# Patient Record
Sex: Female | Born: 1940
Health system: Southern US, Community
[De-identification: ages and names within clinical notes are randomized; demographics above are authoritative.]

## PROBLEM LIST (undated history)

## (undated) DIAGNOSIS — I10 Essential (primary) hypertension: Secondary | ICD-10-CM

## (undated) DIAGNOSIS — Z9221 Personal history of antineoplastic chemotherapy: Secondary | ICD-10-CM

## (undated) DIAGNOSIS — R12 Heartburn: Secondary | ICD-10-CM

## (undated) DIAGNOSIS — C259 Malignant neoplasm of pancreas, unspecified: Secondary | ICD-10-CM

## (undated) DIAGNOSIS — C50919 Malignant neoplasm of unspecified site of unspecified female breast: Secondary | ICD-10-CM

## (undated) DIAGNOSIS — F419 Anxiety disorder, unspecified: Secondary | ICD-10-CM

## (undated) DIAGNOSIS — M1990 Unspecified osteoarthritis, unspecified site: Secondary | ICD-10-CM

## (undated) HISTORY — DX: Malignant neoplasm of pancreas, unspecified: C25.9

## (undated) HISTORY — DX: Anxiety disorder, unspecified: F41.9

## (undated) HISTORY — DX: Heartburn: R12

## (undated) HISTORY — DX: Unspecified osteoarthritis, unspecified site: M19.90

## (undated) HISTORY — DX: Essential (primary) hypertension: I10

## (undated) HISTORY — PX: OTHER SURGICAL HISTORY: SHX169

## (undated) HISTORY — PX: ABDOMINAL HYSTERECTOMY: SHX81

## (undated) HISTORY — PX: GALLBLADDER SURGERY: SHX652

---

## 1976-03-23 HISTORY — PX: LAPAROSCOPIC OOPHERECTOMY: SHX6507

## 1987-03-24 HISTORY — PX: OTHER SURGICAL HISTORY: SHX169

## 1991-03-24 DIAGNOSIS — Z9221 Personal history of antineoplastic chemotherapy: Secondary | ICD-10-CM

## 1991-03-24 DIAGNOSIS — C50919 Malignant neoplasm of unspecified site of unspecified female breast: Secondary | ICD-10-CM

## 1991-03-24 HISTORY — DX: Malignant neoplasm of unspecified site of unspecified female breast: C50.919

## 1991-03-24 HISTORY — DX: Personal history of antineoplastic chemotherapy: Z92.21

## 1991-03-24 HISTORY — PX: MASTECTOMY: SHX3

## 2004-05-14 ENCOUNTER — Ambulatory Visit: Payer: Self-pay | Admitting: General Surgery

## 2004-12-26 ENCOUNTER — Ambulatory Visit: Payer: Self-pay | Admitting: Unknown Physician Specialty

## 2005-06-03 ENCOUNTER — Ambulatory Visit: Payer: Self-pay | Admitting: General Surgery

## 2006-05-07 ENCOUNTER — Other Ambulatory Visit: Payer: Self-pay

## 2006-05-07 ENCOUNTER — Observation Stay: Payer: Self-pay | Admitting: Internal Medicine

## 2006-06-22 ENCOUNTER — Ambulatory Visit: Payer: Self-pay | Admitting: General Surgery

## 2007-07-20 ENCOUNTER — Ambulatory Visit: Payer: Self-pay | Admitting: General Surgery

## 2007-12-07 ENCOUNTER — Ambulatory Visit: Payer: Self-pay | Admitting: Chiropractic Medicine

## 2008-02-02 ENCOUNTER — Ambulatory Visit: Payer: Self-pay | Admitting: Neurology

## 2008-07-25 ENCOUNTER — Ambulatory Visit: Payer: Self-pay | Admitting: Internal Medicine

## 2008-08-28 ENCOUNTER — Ambulatory Visit: Payer: Self-pay | Admitting: Unknown Physician Specialty

## 2009-09-12 ENCOUNTER — Ambulatory Visit: Payer: Self-pay | Admitting: Internal Medicine

## 2009-09-25 ENCOUNTER — Ambulatory Visit: Payer: Self-pay | Admitting: Internal Medicine

## 2010-09-16 ENCOUNTER — Ambulatory Visit: Payer: Self-pay | Admitting: Internal Medicine

## 2011-10-27 ENCOUNTER — Ambulatory Visit: Payer: Self-pay | Admitting: Internal Medicine

## 2012-11-02 ENCOUNTER — Ambulatory Visit: Payer: Self-pay | Admitting: Internal Medicine

## 2013-10-09 DIAGNOSIS — Z86010 Personal history of colonic polyps: Secondary | ICD-10-CM | POA: Insufficient documentation

## 2013-10-09 DIAGNOSIS — R1310 Dysphagia, unspecified: Secondary | ICD-10-CM | POA: Insufficient documentation

## 2013-10-09 DIAGNOSIS — K219 Gastro-esophageal reflux disease without esophagitis: Secondary | ICD-10-CM | POA: Insufficient documentation

## 2013-11-07 ENCOUNTER — Ambulatory Visit: Payer: Self-pay | Admitting: Internal Medicine

## 2013-11-13 ENCOUNTER — Ambulatory Visit: Payer: Self-pay | Admitting: Unknown Physician Specialty

## 2013-11-16 LAB — PATHOLOGY REPORT

## 2014-10-25 ENCOUNTER — Other Ambulatory Visit: Payer: Self-pay | Admitting: Internal Medicine

## 2014-10-25 DIAGNOSIS — Z1231 Encounter for screening mammogram for malignant neoplasm of breast: Secondary | ICD-10-CM

## 2014-11-09 ENCOUNTER — Ambulatory Visit
Admission: RE | Admit: 2014-11-09 | Discharge: 2014-11-09 | Disposition: A | Discharge status: Home / Self Care | Payer: PPO | Source: Ambulatory Visit | Attending: Internal Medicine | Admitting: Internal Medicine

## 2014-11-09 DIAGNOSIS — Z9011 Acquired absence of right breast and nipple: Secondary | ICD-10-CM | POA: Diagnosis not present

## 2014-11-09 DIAGNOSIS — Z1231 Encounter for screening mammogram for malignant neoplasm of breast: Secondary | ICD-10-CM | POA: Insufficient documentation

## 2014-11-09 HISTORY — DX: Personal history of antineoplastic chemotherapy: Z92.21

## 2014-11-09 HISTORY — DX: Malignant neoplasm of unspecified site of unspecified female breast: C50.919

## 2014-11-22 DIAGNOSIS — I10 Essential (primary) hypertension: Secondary | ICD-10-CM | POA: Insufficient documentation

## 2015-04-23 DIAGNOSIS — Z4431 Encounter for fitting and adjustment of external right breast prosthesis: Secondary | ICD-10-CM | POA: Diagnosis not present

## 2015-04-23 DIAGNOSIS — C50111 Malignant neoplasm of central portion of right female breast: Secondary | ICD-10-CM | POA: Diagnosis not present

## 2015-04-23 DIAGNOSIS — D0500 Lobular carcinoma in situ of unspecified breast: Secondary | ICD-10-CM | POA: Diagnosis not present

## 2015-05-24 DIAGNOSIS — I10 Essential (primary) hypertension: Secondary | ICD-10-CM | POA: Diagnosis not present

## 2015-05-24 DIAGNOSIS — E7800 Pure hypercholesterolemia, unspecified: Secondary | ICD-10-CM | POA: Diagnosis not present

## 2015-06-25 DIAGNOSIS — H16042 Marginal corneal ulcer, left eye: Secondary | ICD-10-CM | POA: Diagnosis not present

## 2015-07-01 DIAGNOSIS — H16042 Marginal corneal ulcer, left eye: Secondary | ICD-10-CM | POA: Diagnosis not present

## 2015-07-15 DIAGNOSIS — H16042 Marginal corneal ulcer, left eye: Secondary | ICD-10-CM | POA: Diagnosis not present

## 2015-09-16 DIAGNOSIS — N393 Stress incontinence (female) (male): Secondary | ICD-10-CM | POA: Diagnosis not present

## 2015-09-16 DIAGNOSIS — N3946 Mixed incontinence: Secondary | ICD-10-CM | POA: Diagnosis not present

## 2015-10-22 ENCOUNTER — Other Ambulatory Visit: Payer: Self-pay | Admitting: Internal Medicine

## 2015-10-22 DIAGNOSIS — Z1231 Encounter for screening mammogram for malignant neoplasm of breast: Secondary | ICD-10-CM

## 2015-11-06 DIAGNOSIS — R200 Anesthesia of skin: Secondary | ICD-10-CM | POA: Insufficient documentation

## 2015-11-06 DIAGNOSIS — M7061 Trochanteric bursitis, right hip: Secondary | ICD-10-CM | POA: Diagnosis not present

## 2015-11-06 DIAGNOSIS — R202 Paresthesia of skin: Secondary | ICD-10-CM | POA: Diagnosis not present

## 2015-11-06 DIAGNOSIS — M545 Low back pain, unspecified: Secondary | ICD-10-CM | POA: Insufficient documentation

## 2015-11-13 DIAGNOSIS — M5416 Radiculopathy, lumbar region: Secondary | ICD-10-CM | POA: Diagnosis not present

## 2015-11-13 DIAGNOSIS — M5136 Other intervertebral disc degeneration, lumbar region: Secondary | ICD-10-CM | POA: Diagnosis not present

## 2015-11-14 ENCOUNTER — Ambulatory Visit: Payer: PPO

## 2015-11-15 ENCOUNTER — Other Ambulatory Visit: Payer: Self-pay | Admitting: Internal Medicine

## 2015-11-15 ENCOUNTER — Other Ambulatory Visit: Payer: Self-pay | Admitting: Neurology

## 2015-11-15 ENCOUNTER — Ambulatory Visit
Admission: RE | Admit: 2015-11-15 | Discharge: 2015-11-15 | Disposition: A | Discharge status: Home / Self Care | Payer: PPO | Source: Ambulatory Visit | Attending: Internal Medicine | Admitting: Internal Medicine

## 2015-11-15 DIAGNOSIS — M545 Low back pain: Secondary | ICD-10-CM

## 2015-11-15 DIAGNOSIS — Z1231 Encounter for screening mammogram for malignant neoplasm of breast: Secondary | ICD-10-CM

## 2015-11-20 DIAGNOSIS — H8110 Benign paroxysmal vertigo, unspecified ear: Secondary | ICD-10-CM | POA: Diagnosis not present

## 2015-11-20 DIAGNOSIS — E7800 Pure hypercholesterolemia, unspecified: Secondary | ICD-10-CM | POA: Diagnosis not present

## 2015-11-20 DIAGNOSIS — I10 Essential (primary) hypertension: Secondary | ICD-10-CM | POA: Diagnosis not present

## 2015-11-20 DIAGNOSIS — M545 Low back pain: Secondary | ICD-10-CM | POA: Diagnosis not present

## 2015-11-21 ENCOUNTER — Ambulatory Visit
Admission: RE | Admit: 2015-11-21 | Discharge: 2015-11-21 | Disposition: A | Discharge status: Home / Self Care | Payer: PPO | Source: Ambulatory Visit | Attending: Neurology | Admitting: Neurology

## 2015-11-21 DIAGNOSIS — M5136 Other intervertebral disc degeneration, lumbar region: Secondary | ICD-10-CM | POA: Insufficient documentation

## 2015-11-21 DIAGNOSIS — M545 Low back pain: Secondary | ICD-10-CM

## 2015-11-26 DIAGNOSIS — Z0000 Encounter for general adult medical examination without abnormal findings: Secondary | ICD-10-CM | POA: Diagnosis not present

## 2015-11-26 DIAGNOSIS — I10 Essential (primary) hypertension: Secondary | ICD-10-CM | POA: Diagnosis not present

## 2015-11-26 DIAGNOSIS — M545 Low back pain: Secondary | ICD-10-CM | POA: Diagnosis not present

## 2015-11-26 DIAGNOSIS — E7800 Pure hypercholesterolemia, unspecified: Secondary | ICD-10-CM | POA: Diagnosis not present

## 2015-11-28 ENCOUNTER — Ambulatory Visit: Payer: PPO

## 2015-12-09 DIAGNOSIS — M5136 Other intervertebral disc degeneration, lumbar region: Secondary | ICD-10-CM | POA: Diagnosis not present

## 2015-12-09 DIAGNOSIS — M5416 Radiculopathy, lumbar region: Secondary | ICD-10-CM | POA: Diagnosis not present

## 2015-12-17 DIAGNOSIS — M545 Low back pain: Secondary | ICD-10-CM | POA: Diagnosis not present

## 2015-12-17 DIAGNOSIS — M7061 Trochanteric bursitis, right hip: Secondary | ICD-10-CM | POA: Diagnosis not present

## 2015-12-17 DIAGNOSIS — R200 Anesthesia of skin: Secondary | ICD-10-CM | POA: Diagnosis not present

## 2015-12-17 DIAGNOSIS — R202 Paresthesia of skin: Secondary | ICD-10-CM | POA: Diagnosis not present

## 2015-12-17 DIAGNOSIS — G479 Sleep disorder, unspecified: Secondary | ICD-10-CM | POA: Insufficient documentation

## 2015-12-17 DIAGNOSIS — H8110 Benign paroxysmal vertigo, unspecified ear: Secondary | ICD-10-CM | POA: Diagnosis not present

## 2015-12-19 DIAGNOSIS — M5416 Radiculopathy, lumbar region: Secondary | ICD-10-CM | POA: Diagnosis not present

## 2015-12-19 DIAGNOSIS — M5136 Other intervertebral disc degeneration, lumbar region: Secondary | ICD-10-CM | POA: Diagnosis not present

## 2016-01-09 DIAGNOSIS — M5136 Other intervertebral disc degeneration, lumbar region: Secondary | ICD-10-CM | POA: Diagnosis not present

## 2016-01-09 DIAGNOSIS — M5416 Radiculopathy, lumbar region: Secondary | ICD-10-CM | POA: Diagnosis not present

## 2016-02-26 DIAGNOSIS — M5416 Radiculopathy, lumbar region: Secondary | ICD-10-CM | POA: Diagnosis not present

## 2016-02-26 DIAGNOSIS — M5136 Other intervertebral disc degeneration, lumbar region: Secondary | ICD-10-CM | POA: Diagnosis not present

## 2016-03-10 DIAGNOSIS — R300 Dysuria: Secondary | ICD-10-CM | POA: Diagnosis not present

## 2016-03-10 DIAGNOSIS — R1031 Right lower quadrant pain: Secondary | ICD-10-CM | POA: Diagnosis not present

## 2016-03-10 DIAGNOSIS — R1032 Left lower quadrant pain: Secondary | ICD-10-CM | POA: Diagnosis not present

## 2016-03-10 DIAGNOSIS — M5441 Lumbago with sciatica, right side: Secondary | ICD-10-CM | POA: Diagnosis not present

## 2016-04-30 DIAGNOSIS — M5416 Radiculopathy, lumbar region: Secondary | ICD-10-CM | POA: Diagnosis not present

## 2016-04-30 DIAGNOSIS — M5136 Other intervertebral disc degeneration, lumbar region: Secondary | ICD-10-CM | POA: Diagnosis not present

## 2016-05-26 DIAGNOSIS — Z0000 Encounter for general adult medical examination without abnormal findings: Secondary | ICD-10-CM | POA: Diagnosis not present

## 2016-05-26 DIAGNOSIS — I10 Essential (primary) hypertension: Secondary | ICD-10-CM | POA: Diagnosis not present

## 2016-05-27 ENCOUNTER — Emergency Department
Admission: EM | Admit: 2016-05-27 | Discharge: 2016-05-27 | Disposition: A | Discharge status: Home / Self Care | Payer: PPO | Attending: Emergency Medicine | Admitting: Emergency Medicine

## 2016-05-27 ENCOUNTER — Encounter: Payer: Self-pay | Admitting: Emergency Medicine

## 2016-05-27 DIAGNOSIS — M549 Dorsalgia, unspecified: Secondary | ICD-10-CM | POA: Diagnosis not present

## 2016-05-27 DIAGNOSIS — Z79899 Other long term (current) drug therapy: Secondary | ICD-10-CM | POA: Diagnosis not present

## 2016-05-27 DIAGNOSIS — F419 Anxiety disorder, unspecified: Secondary | ICD-10-CM | POA: Insufficient documentation

## 2016-05-27 DIAGNOSIS — I10 Essential (primary) hypertension: Secondary | ICD-10-CM | POA: Insufficient documentation

## 2016-05-27 DIAGNOSIS — Z853 Personal history of malignant neoplasm of breast: Secondary | ICD-10-CM | POA: Insufficient documentation

## 2016-05-27 DIAGNOSIS — Z7982 Long term (current) use of aspirin: Secondary | ICD-10-CM | POA: Diagnosis not present

## 2016-05-27 LAB — URINALYSIS, COMPLETE (UACMP) WITH MICROSCOPIC
BACTERIA UA: NONE SEEN
Bilirubin Urine: NEGATIVE
GLUCOSE, UA: NEGATIVE mg/dL
KETONES UR: NEGATIVE mg/dL
Leukocytes, UA: NEGATIVE
Nitrite: NEGATIVE
PH: 6 (ref 5.0–8.0)
Protein, ur: NEGATIVE mg/dL
Specific Gravity, Urine: 1.003 — ABNORMAL LOW (ref 1.005–1.030)

## 2016-05-27 LAB — CBC WITH DIFFERENTIAL/PLATELET
BASOS ABS: 0 10*3/uL (ref 0–0.1)
Basophils Relative: 1 %
Eosinophils Absolute: 0.2 10*3/uL (ref 0–0.7)
Eosinophils Relative: 3 %
HEMATOCRIT: 42.3 % (ref 35.0–47.0)
HEMOGLOBIN: 14.8 g/dL (ref 12.0–16.0)
LYMPHS PCT: 18 %
Lymphs Abs: 1.2 10*3/uL (ref 1.0–3.6)
MCH: 33.1 pg (ref 26.0–34.0)
MCHC: 35 g/dL (ref 32.0–36.0)
MCV: 94.6 fL (ref 80.0–100.0)
MONO ABS: 0.6 10*3/uL (ref 0.2–0.9)
MONOS PCT: 9 %
NEUTROS PCT: 69 %
Neutro Abs: 4.8 10*3/uL (ref 1.4–6.5)
Platelets: 310 10*3/uL (ref 150–440)
RBC: 4.47 MIL/uL (ref 3.80–5.20)
RDW: 13 % (ref 11.5–14.5)
WBC: 6.9 10*3/uL (ref 3.6–11.0)

## 2016-05-27 LAB — COMPREHENSIVE METABOLIC PANEL
ALBUMIN: 4.2 g/dL (ref 3.5–5.0)
ALK PHOS: 58 U/L (ref 38–126)
ALT: 18 U/L (ref 14–54)
AST: 28 U/L (ref 15–41)
Anion gap: 8 (ref 5–15)
BILIRUBIN TOTAL: 0.5 mg/dL (ref 0.3–1.2)
BUN: 14 mg/dL (ref 6–20)
CALCIUM: 9.3 mg/dL (ref 8.9–10.3)
CO2: 27 mmol/L (ref 22–32)
Chloride: 105 mmol/L (ref 101–111)
Creatinine, Ser: 0.65 mg/dL (ref 0.44–1.00)
GFR calc Af Amer: >60 mL/min (ref >60)
GLUCOSE: 113 mg/dL — AB (ref 65–99)
Potassium: 3.9 mmol/L (ref 3.5–5.1)
Sodium: 140 mmol/L (ref 135–145)
TOTAL PROTEIN: 7 g/dL (ref 6.5–8.1)

## 2016-05-27 LAB — TROPONIN I

## 2016-05-27 MED ORDER — DIAZEPAM 5 MG PO TABS
5.0000 mg | ORAL_TABLET | Freq: Once | ORAL | Status: AC
  Administered 2016-05-27: 5 mg via ORAL
  Filled 2016-05-27: qty 1

## 2016-05-27 NOTE — ED Triage Notes (Signed)
Pt via ems from home with chronic back pain and bp that is trending upward. She called her pcp, who told her to come to the ED. Pt alert & oriented with nad noted.

## 2016-05-27 NOTE — ED Notes (Signed)
Patient ambulated to the bathroom with out difficulty. Patients son was in the room upon my arrival. Patient got very tearful when patient left for privacy with urination stating some personal issues with him. Patient asked for son to stay out in lobby until he decided to go home.

## 2016-05-27 NOTE — ED Provider Notes (Signed)
[facility] Emergency Department Provider Note        Time seen: ----------------------------------------- 5:26 PM on 05/27/2016 -----------------------------------------    I have reviewed the triage vital signs and the nursing notes.   HISTORY  Chief Complaint Hypertension and Back Pain    HPI Emily Harding is a 76 y.o. female who presents to ER with chronic back pain and blood pressure that is trending upward. Patient called her primary care doctor who told her to come to the ER due to high blood pressure. On arrival blood pressure was 202/100. She denies any complaints other than her chronic back pain. She denies recent illness, fevers, chills, chest pain, shortness of breath, vomiting or diarrhea. She is requesting something for her anxiety as well.   Past Medical History:  Diagnosis Date  . Breast cancer ([place]) 1993   RT MASTECTOMY  . S/P chemotherapy, time since greater than 12 weeks 1993   BREAST CA    There are no active problems to display for this patient.   Past Surgical History:  Procedure Laterality Date  . ABDOMINAL HYSTERECTOMY    . MASTECTOMY Right 1993   BREAST CA    Allergies Patient has no allergy information on record.  Social History Social History  Substance Use Topics  . Smoking status: Not on file  . Smokeless tobacco: Not on file  . Alcohol use Not on file    Review of Systems Constitutional: Negative for fever. Cardiovascular: Negative for chest pain. Respiratory: Negative for shortness of breath. Gastrointestinal: Negative for abdominal pain, vomiting and diarrhea. Genitourinary: Negative for dysuria. Musculoskeletal: Positive for back pain Skin: Negative for rash. Neurological: Negative for headaches, focal weakness or numbness. Psychiatric: Positive for anxiety  10-point ROS otherwise negative.  ____________________________________________   PHYSICAL EXAM:  VITAL SIGNS: ED Triage Vitals  [05/27/16 1725]  Enc Vitals Group     BP (!) 202/100     Pulse Rate 75     Resp 18     Temp 98.7 F (37.1 C)     Temp Source Oral     SpO2 97 %     Weight      Height      Head Circumference      Peak Flow      Pain Score      Pain Loc      Pain Edu?      Excl. in [place]?    Constitutional: Alert and oriented. Anxious, no distress Eyes: Conjunctivae are normal. PERRL. Normal extraocular movements. ENT   Head: Normocephalic and atraumatic.   Nose: No congestion/rhinnorhea.   Mouth/Throat: Mucous membranes are moist.   Neck: No stridor. Cardiovascular: Normal rate, regular rhythm. No murmurs, rubs, or gallops. Respiratory: Normal respiratory effort without tachypnea nor retractions. Breath sounds are clear and equal bilaterally. No wheezes/rales/rhonchi. Gastrointestinal: Soft and nontender. Normal bowel sounds Musculoskeletal: Nontender with normal range of motion in all extremities. No lower extremity tenderness nor edema. Neurologic:  Normal speech and language. No gross focal neurologic deficits are appreciated.  Skin:  Skin is warm, dry and intact. No rash noted. Psychiatric: Mood and affect are normal. Speech and behavior are normal.  ____________________________________________  EKG: Interpreted by me. Sinus rhythm rate of 75 bpm, normal PR interval, wide QRS, left bundle branch block.  ____________________________________________  ED COURSE:  Pertinent labs & imaging results that were available during my care of the patient were reviewed by me and considered in my medical decision making (see  chart for details). Patient presents to ER with back pain and anxiety. We will assess with labs and given oral Valium.   Procedures ____________________________________________   LABS (pertinent positives/negatives)  Labs Reviewed  URINALYSIS, COMPLETE (UACMP) WITH MICROSCOPIC - Abnormal; Notable for the following:       Result Value   Color, Urine COLORLESS (*)     APPearance CLEAR (*)    Specific Gravity, Urine 1.003 (*)    Hgb urine dipstick SMALL (*)    Squamous Epithelial / LPF 0-5 (*)    All other components within normal limits  COMPREHENSIVE METABOLIC PANEL - Abnormal; Notable for the following:    Glucose, Bld 113 (*)    All other components within normal limits  CBC WITH DIFFERENTIAL/PLATELET  TROPONIN I  CBC WITH DIFFERENTIAL/PLATELET   ____________________________________________  FINAL ASSESSMENT AND PLAN  Hypertension  Plan: Patient with labs and imaging as dictated above. Patient essentially presented due to high blood pressure. She has chronic back pain and admittedly was somewhat anxious. She does have an old left bundle branch block as seen on previous EKG. She was not having complaints of chest pain or shortness of breath. She is stable for outpatient follow-up with her doctor.   Earleen Newport, MD   Note: This note was generated in part or whole with voice recognition software. Voice recognition is usually quite accurate but there are transcription errors that can and very often do occur. I apologize for any typographical errors that were not detected and corrected.     Earleen Newport, MD 05/27/16 2017

## 2016-05-27 NOTE — ED Notes (Signed)
Lab personnel in room 6; will try room 7 next.

## 2016-06-02 DIAGNOSIS — I10 Essential (primary) hypertension: Secondary | ICD-10-CM | POA: Diagnosis not present

## 2016-06-15 DIAGNOSIS — R32 Unspecified urinary incontinence: Secondary | ICD-10-CM | POA: Diagnosis not present

## 2016-06-15 DIAGNOSIS — I10 Essential (primary) hypertension: Secondary | ICD-10-CM | POA: Diagnosis not present

## 2016-06-15 DIAGNOSIS — R300 Dysuria: Secondary | ICD-10-CM | POA: Diagnosis not present

## 2016-06-30 DIAGNOSIS — M5136 Other intervertebral disc degeneration, lumbar region: Secondary | ICD-10-CM | POA: Diagnosis not present

## 2016-06-30 DIAGNOSIS — M5416 Radiculopathy, lumbar region: Secondary | ICD-10-CM | POA: Diagnosis not present

## 2016-07-06 DIAGNOSIS — I10 Essential (primary) hypertension: Secondary | ICD-10-CM | POA: Diagnosis not present

## 2016-07-14 ENCOUNTER — Ambulatory Visit: Payer: Self-pay

## 2016-07-16 DIAGNOSIS — R609 Edema, unspecified: Secondary | ICD-10-CM | POA: Diagnosis not present

## 2016-07-16 DIAGNOSIS — I10 Essential (primary) hypertension: Secondary | ICD-10-CM | POA: Diagnosis not present

## 2016-07-16 DIAGNOSIS — R42 Dizziness and giddiness: Secondary | ICD-10-CM | POA: Diagnosis not present

## 2016-07-27 DIAGNOSIS — R300 Dysuria: Secondary | ICD-10-CM | POA: Diagnosis not present

## 2016-07-27 DIAGNOSIS — I10 Essential (primary) hypertension: Secondary | ICD-10-CM | POA: Diagnosis not present

## 2016-08-03 NOTE — Progress Notes (Signed)
08/04/2016 2:45 PM   Emily Harding 1940/10/13 [id number]  Referring provider: Madelyn Brunner, MD [facility] [place], [place] [address]  Chief Complaint  Patient presents with  . New Patient (Initial Visit)    Dysuria changed urologists because of insurance    HPI: Patient is a 76 -year-old Caucasian female who is referred to [place] by, Dr. Lisette Grinder, for recurrent urinary tract infections.  Patient states that she has had three urinary tract infections over the last year.    Reviewing her records,  she has not had any documented infections over the last year.    Her symptoms with a urinary tract infection consist of suprapubic pain and dysuria.  She denies gross hematuria, back pain, abdominal pain or flank pain.   She has not had any recent fevers, chills, nausea or vomiting.   She does not have a history of nephrolithiasis, GU surgery or GU trauma.  She is not sexually active.  She is postmenopausal.   She admits to constipation.  She does engage in good perineal hygiene. She does not take tub baths.   Her baseline symptoms consist of frequency x 8-10, urgency is moderate, nocturia x 1, incontinence (SUI and UI), intermittency and hesitancy.  She is using incontinence pads for over ten years.  6 to 10 pads daily.    She is not having pain with bladder filling.  She has not had any recent imaging studies.    She is drinking three bottles of water daily.   Glass and a half of sweat tea and Sprite mostly for liquids.  2 cups of coffee a daily.    Her PVR is 0 mL.    PMH: Past Medical History:  Diagnosis Date  . Anxiety   . Arthritis   . Breast cancer ([place]) 1993   RT MASTECTOMY  . Heartburn   . HTN (hypertension)   . S/P chemotherapy, time since greater than 12 weeks 1993   BREAST CA    Surgical History: Past Surgical History:  Procedure Laterality Date  . ABDOMINAL HYSTERECTOMY    . colon polyp removal    . GALLBLADDER  SURGERY    . LAPAROSCOPIC OOPHERECTOMY  1978  . MASTECTOMY Right 1993   BREAST CA  . removal of fallopian tubes  1989    Home Medications:  Allergies as of 08/04/2016      Reactions   Azithromycin    Codeine Sulfate Other (See Comments)   Doxycycline Calcium Other (See Comments)   Duloxetine Nausea Only   Erythromycin Ethylsuccinate Other (See Comments)   Macrolides And Ketolides Other (See Comments)   Sulfa Antibiotics       Medication List       Accurate as of 08/04/16  2:45 PM. Always use your most recent med list.          aspirin EC 81 MG tablet Take 81 mg by mouth daily.   atenolol 50 MG tablet Commonly known as:  TENORMIN Take 50 mg by mouth daily.   celecoxib 200 MG capsule Commonly known as:  CELEBREX Take 200 mg by mouth daily.   citalopram 20 MG tablet Commonly known as:  CELEXA Take 20 mg by mouth at bedtime.   clorazepate 3.75 MG tablet Commonly known as:  TRANXENE Take 3.75 mg by mouth daily as needed.   cyclobenzaprine 10 MG tablet Commonly known as:  FLEXERIL TAKE 1 TABLET (10 MG TOTAL) BY MOUTH 3 (THREE)  TIMES DAILY AS NEEDED FOR MUSCLE SPASMS.   esomeprazole 40 MG capsule Commonly known as:  [place] Take by mouth.   gabapentin 400 MG capsule Commonly known as:  NEURONTIN Take 400 mg by mouth 3 (three) times daily.   HYDROcodone-acetaminophen 5-325 MG tablet Commonly known as:  NORCO/VICODIN Take 1 tablet by mouth every 6 (six) hours as needed.   losartan 50 MG tablet Commonly known as:  COZAAR Take by mouth.   meloxicam 7.5 MG tablet Commonly known as:  MOBIC Take 7.5 mg by mouth daily.   MULTI-VITAMINS Tabs Take 1 tablet by mouth daily.   promethazine 25 MG tablet Commonly known as:  PHENERGAN Take by mouth.   SM CALCIUM 500/VITAMIN D3 500-400 MG-UNIT tablet Generic drug:  calcium-vitamin D Take 1 tablet by mouth daily.   zolpidem 10 MG tablet Commonly known as:  AMBIEN Take 10 mg by mouth at bedtime.        Allergies:  Allergies  Allergen Reactions  . Azithromycin   . Codeine Sulfate Other (See Comments)  . Doxycycline Calcium Other (See Comments)  . Duloxetine Nausea Only  . Erythromycin Ethylsuccinate Other (See Comments)  . Macrolides And Ketolides Other (See Comments)  . Sulfa Antibiotics     Family History: Family History  Problem Relation Age of Onset  . Breast cancer Neg Hx   . Kidney cancer Neg Hx   . Bladder Cancer Neg Hx   . Prostate cancer Neg Hx     Social History:  reports that she has never smoked. She has never used smokeless tobacco. She reports that she does not drink alcohol or use drugs.  ROS: UROLOGY Frequent Urination?: Yes Hard to postpone urination?: Yes Burning/pain with urination?: No Get up at night to urinate?: Yes Leakage of urine?: Yes Urine stream starts and stops?: Yes Trouble starting stream?: Yes Do you have to strain to urinate?: No Blood in urine?: No Urinary tract infection?: Yes Sexually transmitted disease?: No Injury to kidneys or bladder?: No Painful intercourse?: No Weak stream?: No Currently pregnant?: No Vaginal bleeding?: No Last menstrual period?: n  Gastrointestinal Nausea?: Yes Vomiting?: No Indigestion/heartburn?: Yes Diarrhea?: No Constipation?: Yes  Constitutional Fever: No Night sweats?: No Weight loss?: No Fatigue?: Yes  Skin Skin rash/lesions?: No Itching?: No  Eyes Blurred vision?: No Double vision?: No  Ears/Nose/Throat Sore throat?: No Sinus problems?: No  Hematologic/Lymphatic Swollen glands?: No Easy bruising?: Yes  Cardiovascular Leg swelling?: No Chest pain?: No  Respiratory Cough?: No Shortness of breath?: No  Endocrine Excessive thirst?: Yes  Musculoskeletal Back pain?: Yes Joint pain?: Yes  Neurological Headaches?: No Dizziness?: Yes  Psychologic Depression?: No Anxiety?: No  Physical Exam: BP (!) 154/86   Pulse 69   Ht '5\' 3"'  (1.6 m)   Wt 157 lb 1.6 oz  (71.3 kg)   BMI 27.83 kg/m   Constitutional: Well nourished. Alert and oriented, No acute distress. HEENT: [place] AT, moist mucus membranes. Trachea midline, no masses. Cardiovascular: No clubbing, cyanosis, or edema. Respiratory: Normal respiratory effort, no increased work of breathing. GI: Abdomen is soft, non tender, non distended, no abdominal masses. Liver and spleen not palpable.  No hernias appreciated.  Stool sample for occult testing is not indicated.   GU: No CVA tenderness.  No bladder fullness or masses.  Atrophic external genitalia, normal pubic hair distribution, no lesions.  Normal urethral meatus, no lesions, no prolapse, no discharge.   No urethral masses, tenderness and/or tenderness. No bladder fullness, tenderness or masses. Normal  vagina mucosa, good estrogen effect, no discharge, no lesions, good pelvic support, Grade II cystocele is noted.  Demonstrated urine leakage on Valsalva. No rectocele noted.  Cervix, uterus and adnexa are surgically absent.  Anus and perineum are without rashes or lesions.   Skin: No rashes, bruises or suspicious lesions. Lymph: No cervical or inguinal adenopathy. Neurologic: Grossly intact, no focal deficits, moving all 4 extremities. Psychiatric: Normal mood and affect.  Laboratory Data: Lab Results  Component Value Date   WBC 6.9 05/27/2016   HGB 14.8 05/27/2016   HCT 42.3 05/27/2016   MCV 94.6 05/27/2016   PLT 310 05/27/2016    Lab Results  Component Value Date   CREATININE 0.65 05/27/2016     Lab Results  Component Value Date   AST 28 05/27/2016   Lab Results  Component Value Date   ALT 18 05/27/2016     Pertinent Imaging: Results for TALLULA, GRINDLE (MRN [id number]) as of 08/04/2016 14:21  Ref. Range 08/04/2016 14:05  Scan Result Unknown 0    Assessment & Plan:    1. Mixed Incontinence  - offered behavioral therapies, bladder training, bladder control strategies and pelvic floor muscle training- Patient  deferred  - fluid management - encouraged more water intake  - offered refer to gynecology for a pessary fitting - patient would like referral   2. Dysuria  - criteria for recurrent UTI has not been met with 2 or more infections in 6 months or 3 or greater infections in one year   - advised them to have CATH UA's for urinalysis and culture to prevent skin contamination of the specimen  - reviewed symptoms of UTI and advised not to have urine checked or be treated for UTI if not experiencing symptoms  - discussed antibiotic stewardship with the patient    3. Vaginal atrophy  - I explained to the patient that when women go through menopause and her estrogen levels are severely diminished, the normal vaginal flora will change.  This is due to an increase of the vaginal canal's pH. Because of this, the vaginal canal may be colonized by bacteria from the rectum instead of the protective lactobacillus.  This accompanied by the loss of the mucus barrier with vaginal atrophy is a cause of recurrent urinary tract infections.  - In some studies, the use of vaginal estrogen cream has been demonstrated to reduce  recurrent urinary tract infections to one a year.   - Patient was given a sample of vaginal estrogen cream (Premarin) and instructed to apply 0.63m (pea-sized amount)  just inside the vaginal introitus with a finger-tip on  Monday, Wednesday and Friday nights.  I explained to the patient that vaginally administered estrogen, which causes only a slight increase in the blood estrogen levels, have fewer contraindications and adverse systemic effects that oral HT.  - She will follow up in three months for an exam.      4. Cystocele  - see above                    Return in about 3 months (around 11/04/2016) for OAB questionnaire, PVR and exam.  These notes generated with voice recognition software. I apologize for typographical errors.  SZara Council [facility] [address] [place] [phone]

## 2016-08-04 ENCOUNTER — Ambulatory Visit (INDEPENDENT_AMBULATORY_CARE_PROVIDER_SITE_OTHER): Payer: PPO | Admitting: Urology

## 2016-08-04 ENCOUNTER — Encounter: Payer: Self-pay | Admitting: Urology

## 2016-08-04 VITALS — BP 154/86 | HR 69 | Ht 63.0 in | Wt 157.1 lb

## 2016-08-04 DIAGNOSIS — N3946 Mixed incontinence: Secondary | ICD-10-CM

## 2016-08-04 DIAGNOSIS — R300 Dysuria: Secondary | ICD-10-CM

## 2016-08-04 DIAGNOSIS — N952 Postmenopausal atrophic vaginitis: Secondary | ICD-10-CM

## 2016-08-04 DIAGNOSIS — N8111 Cystocele, midline: Secondary | ICD-10-CM | POA: Diagnosis not present

## 2016-08-04 LAB — BLADDER SCAN AMB NON-IMAGING: Scan Result: 0

## 2016-08-10 DIAGNOSIS — M5416 Radiculopathy, lumbar region: Secondary | ICD-10-CM | POA: Diagnosis not present

## 2016-08-10 DIAGNOSIS — M62830 Muscle spasm of back: Secondary | ICD-10-CM | POA: Diagnosis not present

## 2016-08-10 DIAGNOSIS — M5136 Other intervertebral disc degeneration, lumbar region: Secondary | ICD-10-CM | POA: Diagnosis not present

## 2016-08-13 ENCOUNTER — Telehealth: Payer: Self-pay | Admitting: Urology

## 2016-08-13 DIAGNOSIS — N3946 Mixed incontinence: Secondary | ICD-10-CM

## 2016-08-13 NOTE — Telephone Encounter (Signed)
Pt is totally confused since her visit with Larene Beach.  Please give pt a call to see if you can help answer some of her questions.   [phone]

## 2016-08-14 NOTE — Telephone Encounter (Signed)
Spoke with pt in reference to [place] with [facility]. Pt voiced concern of not having enough time spent with her to answer all her questions. Pt stated that she felt that she was rushed out of the office and did not get a chance to process anything that was said. Pt had questions about the vaginal cream, PT referral, and GYN referral. Reinforced with pt of vaginal cream purpose and usesage, PT and GYN referrals. Pt requested to see GYN at [facility]. Pt then stated that she was under the impression she would be getting a call from each referral within a weeks time and has not heard from anyone. Reinforced with pt it can take some time but both referrals were placed again. Reinforced with pt due to the holiday weekend it may take longer than normal for her to receive a call. Pt voiced understanding of whole conversation.

## 2016-08-29 DIAGNOSIS — N760 Acute vaginitis: Secondary | ICD-10-CM | POA: Diagnosis not present

## 2016-08-29 DIAGNOSIS — N390 Urinary tract infection, site not specified: Secondary | ICD-10-CM | POA: Diagnosis not present

## 2016-08-29 DIAGNOSIS — R300 Dysuria: Secondary | ICD-10-CM | POA: Diagnosis not present

## 2016-09-07 ENCOUNTER — Other Ambulatory Visit: Payer: Self-pay | Admitting: Physician Assistant

## 2016-09-07 DIAGNOSIS — R1030 Lower abdominal pain, unspecified: Secondary | ICD-10-CM | POA: Diagnosis not present

## 2016-09-07 DIAGNOSIS — N390 Urinary tract infection, site not specified: Secondary | ICD-10-CM | POA: Diagnosis not present

## 2016-09-07 DIAGNOSIS — I10 Essential (primary) hypertension: Secondary | ICD-10-CM | POA: Diagnosis not present

## 2016-09-07 DIAGNOSIS — R1031 Right lower quadrant pain: Secondary | ICD-10-CM

## 2016-09-08 ENCOUNTER — Ambulatory Visit
Admission: RE | Admit: 2016-09-08 | Discharge: 2016-09-08 | Disposition: A | Discharge status: Home / Self Care | Payer: PPO | Source: Ambulatory Visit | Attending: Physician Assistant | Admitting: Physician Assistant

## 2016-09-08 DIAGNOSIS — K449 Diaphragmatic hernia without obstruction or gangrene: Secondary | ICD-10-CM | POA: Diagnosis not present

## 2016-09-08 DIAGNOSIS — Z9049 Acquired absence of other specified parts of digestive tract: Secondary | ICD-10-CM | POA: Diagnosis not present

## 2016-09-08 DIAGNOSIS — R1031 Right lower quadrant pain: Secondary | ICD-10-CM | POA: Diagnosis not present

## 2016-09-08 DIAGNOSIS — R140 Abdominal distension (gaseous): Secondary | ICD-10-CM | POA: Diagnosis not present

## 2016-09-08 DIAGNOSIS — I700 Atherosclerosis of aorta: Secondary | ICD-10-CM | POA: Insufficient documentation

## 2016-09-08 DIAGNOSIS — Z90710 Acquired absence of both cervix and uterus: Secondary | ICD-10-CM | POA: Diagnosis not present

## 2016-09-08 MED ORDER — IOPAMIDOL (ISOVUE-300) INJECTION 61%
100.0000 mL | Freq: Once | INTRAVENOUS | Status: AC | PRN
  Administered 2016-09-08: 100 mL via INTRAVENOUS

## 2016-09-11 DIAGNOSIS — R300 Dysuria: Secondary | ICD-10-CM | POA: Diagnosis not present

## 2016-09-11 DIAGNOSIS — K5900 Constipation, unspecified: Secondary | ICD-10-CM | POA: Diagnosis not present

## 2016-09-15 DIAGNOSIS — M5416 Radiculopathy, lumbar region: Secondary | ICD-10-CM | POA: Diagnosis not present

## 2016-09-15 DIAGNOSIS — M5136 Other intervertebral disc degeneration, lumbar region: Secondary | ICD-10-CM | POA: Diagnosis not present

## 2016-09-16 ENCOUNTER — Telehealth: Payer: Self-pay

## 2016-09-16 ENCOUNTER — Ambulatory Visit (INDEPENDENT_AMBULATORY_CARE_PROVIDER_SITE_OTHER): Payer: PPO | Admitting: Urology

## 2016-09-16 ENCOUNTER — Encounter: Payer: Self-pay | Admitting: Urology

## 2016-09-16 VITALS — BP 157/76 | HR 86 | Ht 63.0 in | Wt 153.0 lb

## 2016-09-16 DIAGNOSIS — N390 Urinary tract infection, site not specified: Secondary | ICD-10-CM

## 2016-09-16 DIAGNOSIS — N3946 Mixed incontinence: Secondary | ICD-10-CM | POA: Diagnosis not present

## 2016-09-16 DIAGNOSIS — E785 Hyperlipidemia, unspecified: Secondary | ICD-10-CM | POA: Insufficient documentation

## 2016-09-16 DIAGNOSIS — N8111 Cystocele, midline: Secondary | ICD-10-CM | POA: Diagnosis not present

## 2016-09-16 DIAGNOSIS — M797 Fibromyalgia: Secondary | ICD-10-CM | POA: Insufficient documentation

## 2016-09-16 DIAGNOSIS — K635 Polyp of colon: Secondary | ICD-10-CM | POA: Insufficient documentation

## 2016-09-16 DIAGNOSIS — C50919 Malignant neoplasm of unspecified site of unspecified female breast: Secondary | ICD-10-CM | POA: Insufficient documentation

## 2016-09-16 NOTE — Telephone Encounter (Signed)
Patient states she is concerned about taking Myrbetriq samples given today b/c she has 4 of the 7 side effects currently. She is requesting to try different med if possible b/c she insists she cannot have surgery until she tries medication.

## 2016-09-16 NOTE — Patient Instructions (Signed)
Probiotics or daily yogurt (live active culture)  Cranberry twice daily  If you have UTI symptoms, I want you to come here for infections.    Do not use estrogen cream due to your history of breast cancer.  Trial of Mybetriq x 1 month.  Follow up with Dr. Matilde Sprang

## 2016-09-16 NOTE — Progress Notes (Signed)
09/16/2016 4:13 PM   Emily Harding 1941-03-07 [id number]  Referring provider: Madelyn Brunner, MD [facility] [place], [place] [address]  No chief complaint on file.  HPI: 76 year old female with recurrent urinary tract infections as well as mixed urinary incontinence who presents today for a second opinion. She reports that she did not have a good interaction with the PA in our office and is requesting to be seen by physician.  She reports that she's had numerous infections over the past year. Review of records reveals several suspicious urinalyses since March 2018.  She does have one documented Escherichia coli infection on 08/29/2016. She has multiple drug allergies. Her symptoms include suprapubic pain and dysuria. She is a personal history of breast cancer therefore is unable to tolerate topical estrogen cream.  In addition to this, she is very bothered by her urinary symptoms. These include urinary frequency, and urgency as well as with pelvic urge incontinence(large volume incontinence). She saturates numerous pads per day. She also has some mild occasional leakage with laughing and sneezing. She was prescribed Mybetriq 25 mg tablets but has not taken these medications that she is worried about the side effects.  She is also fixated today on having surgery for her "bladder problems".  She reports that she was offered surgery by Dr. Rogers Blocker last year, but had to change providers due to insurance issues. She would like to have her surgery on a very specific timeline due to her family situation.  Behavioral modification was previous to discussed but she can be sutured drink sweet tea, spray, and 2 cups of coffee daily.  She does have chronic constipation.  Please see Zara Council note for further history.     PMH: Past Medical History:  Diagnosis Date  . Anxiety   . Arthritis   . Breast cancer ([place]) 1993   RT MASTECTOMY  . Heartburn   .  HTN (hypertension)   . S/P chemotherapy, time since greater than 12 weeks 1993   BREAST CA    Surgical History: Past Surgical History:  Procedure Laterality Date  . ABDOMINAL HYSTERECTOMY    . colon polyp removal    . GALLBLADDER SURGERY    . LAPAROSCOPIC OOPHERECTOMY  1978  . MASTECTOMY Right 1993   BREAST CA  . removal of fallopian tubes  1989    Home Medications:  Allergies as of 09/16/2016      Reactions   Azithromycin    Codeine Sulfate Other (See Comments)   Doxycycline Calcium Other (See Comments)   Duloxetine Nausea Only   Erythromycin Ethylsuccinate Other (See Comments)   Macrolides And Ketolides Other (See Comments)   Nitrofurantoin    Other reaction(s): Unknown   Sulfa Antibiotics       Medication List       Accurate as of 09/16/16 11:59 PM. Always use your most recent med list.          aspirin EC 81 MG tablet Take 81 mg by mouth daily.   atenolol 50 MG tablet Commonly known as:  TENORMIN Take 50 mg by mouth daily.   celecoxib 200 MG capsule Commonly known as:  CELEBREX Take 200 mg by mouth daily.   clorazepate 3.75 MG tablet Commonly known as:  TRANXENE Take 3.75 mg by mouth daily as needed.   cyclobenzaprine 10 MG tablet Commonly known as:  FLEXERIL TAKE 1 TABLET (10 MG TOTAL) BY MOUTH 3 (THREE) TIMES DAILY AS NEEDED FOR MUSCLE SPASMS.  esomeprazole 40 MG capsule Commonly known as:  NEXIUM Take by mouth.   gabapentin 400 MG capsule Commonly known as:  NEURONTIN Take 400 mg by mouth 3 (three) times daily.   HYDROcodone-acetaminophen 5-325 MG tablet Commonly known as:  NORCO/VICODIN Take 1 tablet by mouth every 6 (six) hours as needed.   losartan 50 MG tablet Commonly known as:  COZAAR Take by mouth.   meloxicam 7.5 MG tablet Commonly known as:  MOBIC Take 7.5 mg by mouth daily.   MULTI-VITAMINS Tabs Take 1 tablet by mouth daily.   promethazine 25 MG tablet Commonly known as:  PHENERGAN Take by mouth.   SM CALCIUM  500/VITAMIN D3 500-400 MG-UNIT tablet Generic drug:  calcium-vitamin D Take 1 tablet by mouth daily.   traZODone 100 MG tablet Commonly known as:  DESYREL Take 100 mg by mouth at bedtime.       Allergies:  Allergies  Allergen Reactions  . Azithromycin   . Codeine Sulfate Other (See Comments)  . Doxycycline Calcium Other (See Comments)  . Duloxetine Nausea Only  . Erythromycin Ethylsuccinate Other (See Comments)  . Macrolides And Ketolides Other (See Comments)  . Nitrofurantoin     Other reaction(s): Unknown  . Sulfa Antibiotics     Family History: Family History  Problem Relation Age of Onset  . Breast cancer Neg Hx   . Kidney cancer Neg Hx   . Bladder Cancer Neg Hx   . Prostate cancer Neg Hx     Social History:  reports that she has never smoked. She has never used smokeless tobacco. She reports that she does not drink alcohol or use drugs.  ROS: UROLOGY Frequent Urination?: Yes Hard to postpone urination?: Yes Burning/pain with urination?: No Get up at night to urinate?: Yes Leakage of urine?: Yes Urine stream starts and stops?: Yes Trouble starting stream?: No Do you have to strain to urinate?: No Blood in urine?: No Urinary tract infection?: Yes Sexually transmitted disease?: No Injury to kidneys or bladder?: No Painful intercourse?: No Weak stream?: No Currently pregnant?: No Vaginal bleeding?: No Last menstrual period?: n  Gastrointestinal Nausea?: Yes Vomiting?: Yes Indigestion/heartburn?: No Diarrhea?: No Constipation?: Yes  Constitutional Fever: No Night sweats?: No Weight loss?: No Fatigue?: Yes  Skin Skin rash/lesions?: No Itching?: No  Eyes Blurred vision?: No Double vision?: No  Ears/Nose/Throat Sore throat?: No Sinus problems?: No  Hematologic/Lymphatic Swollen glands?: No Easy bruising?: No  Cardiovascular Leg swelling?: No Chest pain?: No  Respiratory Cough?: No Shortness of breath?: No  Endocrine Excessive  thirst?: No  Musculoskeletal Back pain?: No Joint pain?: No  Neurological Headaches?: No Dizziness?: No  Psychologic Depression?: No Anxiety?: No  Physical Exam: BP (!) 157/76   Pulse 86   Ht 5\' 3"  (1.6 m)   Wt 153 lb (69.4 kg)   BMI 27.10 kg/m   Constitutional:  Alert and oriented, No acute distress. HEENT: [place] AT, moist mucus membranes.  Trachea midline, no masses. Cardiovascular: No clubbing, cyanosis, or edema. Respiratory: Normal respiratory effort, no increased work of breathing. GI: Abdomen is soft, nontender, nondistended, no abdominal masses GU: No CVA tenderness. Skin: No rashes, bruises or suspicious lesions. Neurologic: Grossly intact, no focal deficits, moving all 4 extremities. Psychiatric: Somewhat anxious, redundant, and tangential at times.  Laboratory Data: Lab Results  Component Value Date   WBC 6.7 09/20/2016   HGB 16.0 09/20/2016   HCT 45.4 09/20/2016   MCV 95.3 09/20/2016   PLT 314 09/20/2016    Lab Results  Component Value Date   CREATININE 0.95 09/20/2016    Urinalysis Results for orders placed or performed in visit on 09/16/16  Microscopic Examination  Result Value Ref Range   WBC, UA None seen 0 - 5 /hpf   RBC, UA None seen 0 - 2 /hpf   Epithelial Cells (non renal) 0-10 0 - 10 /hpf   Bacteria, UA None seen None seen/Few  Urinalysis, Complete  Result Value Ref Range   Specific Gravity, UA <1.005 (L) 1.005 - 1.030   pH, UA 6.0 5.0 - 7.5   Color, UA Yellow Yellow   Appearance Ur Clear Clear   Leukocytes, UA Negative Negative   Protein, UA Negative Negative/Trace   Glucose, UA Negative Negative   Ketones, UA Negative Negative   RBC, UA Trace (A) Negative   Bilirubin, UA Negative Negative   Urobilinogen, Ur 0.2 0.2 - 1.0 mg/dL   Nitrite, UA Negative Negative   Microscopic Examination See below:    Pertinent Imaging: PVR previously 0  CT scan from 09/08/2016 reviewed, no significant GU findings other than what appears to be  bilateral extra renal pelvis use.  Assessment & Plan:    1. Mixed stress and urge urinary incontinence Suspect primary bother related to significant urge incontinence Extensive conversation today about compliance with behavioral modification and addressing her constipation which also may improve her urinary symptoms I would like her to try Mybetriq 25 mg, we discussed side effects including very mild systolic blood pressure elevation typically no greater than 5 mmHg Direct and clear conversation today that this is unlikely surgical problem and surgery was not offered at this time without the appropriate workup Recommend follow-up with Dr. Matilde Sprang, we'll likely recommend urodynamics in the future  2. Recurrent UTI Symptomatically today that UA is completely negative Adequate bladder emptying demonstrated on previous postvoid residual Treatment strategies for recurrent urinary tract infections were reviewed including cranberry tabs twice daily as well as probiotics She was advised that if she does have signs or symptoms of infection, she should present to our office for same-day nurse visit to rule out UTI Estrogen cream contraindicated due to personal history of breast cancer - Urinalysis, Complete   Return in about 4 weeks (around 10/14/2016) for MacDiarmid.  Hollice Espy, MD  [facility] [address] [place], [place] [address] [phone]  I spent 25 min with this patient of which greater than 50% was spent in counseling and coordination of care with the patient.

## 2016-09-17 LAB — URINALYSIS, COMPLETE
BILIRUBIN UA: NEGATIVE
Glucose, UA: NEGATIVE
Ketones, UA: NEGATIVE
LEUKOCYTES UA: NEGATIVE
Nitrite, UA: NEGATIVE
PH UA: 6 (ref 5.0–7.5)
Protein, UA: NEGATIVE
Specific Gravity, UA: <1.005 — ABNORMAL LOW (ref 1.005–1.030)
Urobilinogen, Ur: 0.2 mg/dL (ref 0.2–1.0)

## 2016-09-17 LAB — MICROSCOPIC EXAMINATION
BACTERIA UA: NONE SEEN
RBC, UA: NONE SEEN /hpf (ref 0–?)
WBC UA: NONE SEEN /HPF (ref 0–?)

## 2016-09-17 NOTE — Telephone Encounter (Signed)
Please let her know that the blood pressure changes Mybetriq is quite minimal, on average 5 mmHg. Additionally, we will know if it works until she is at least tries. If she is unwilling to try, she can try something different such as Toviaz or Vesicare but the side effects of these medications are dry eyes stream outlet constipation which can be quite bothersome. There is also associated cognitive changes in the elderly. The wire to his Mybetriq is this the safest and newest drug.  I would highly encourage her to follow-up with Dr. Matilde Sprang as we discussed.  Hollice Espy, MD

## 2016-09-17 NOTE — Telephone Encounter (Signed)
Per Dr. Erlene Quan ok not to take Myrbetric keep follow up with MacDiarmid Left pt mess to call/SW

## 2016-09-18 NOTE — Telephone Encounter (Signed)
Spoke with patient and notified her of message. She does not want to take the risk of BP elevation. Patient told ok not to take Mybetriq and to come to office to pick up 2 wk of Toviaz 4 mg and Vesicare 5 mg and keep follow up with MacDiarmid. Patient is in agreement with this plan.

## 2016-09-20 ENCOUNTER — Emergency Department: Payer: PPO

## 2016-09-20 ENCOUNTER — Encounter: Payer: Self-pay | Admitting: Emergency Medicine

## 2016-09-20 ENCOUNTER — Emergency Department
Admission: EM | Admit: 2016-09-20 | Discharge: 2016-09-20 | Disposition: A | Discharge status: Home / Self Care | Payer: PPO | Attending: Emergency Medicine | Admitting: Emergency Medicine

## 2016-09-20 DIAGNOSIS — Z79899 Other long term (current) drug therapy: Secondary | ICD-10-CM | POA: Diagnosis not present

## 2016-09-20 DIAGNOSIS — Z853 Personal history of malignant neoplasm of breast: Secondary | ICD-10-CM | POA: Insufficient documentation

## 2016-09-20 DIAGNOSIS — R079 Chest pain, unspecified: Secondary | ICD-10-CM | POA: Diagnosis not present

## 2016-09-20 DIAGNOSIS — Z7982 Long term (current) use of aspirin: Secondary | ICD-10-CM | POA: Diagnosis not present

## 2016-09-20 DIAGNOSIS — I10 Essential (primary) hypertension: Secondary | ICD-10-CM | POA: Diagnosis not present

## 2016-09-20 DIAGNOSIS — R0789 Other chest pain: Secondary | ICD-10-CM | POA: Diagnosis not present

## 2016-09-20 LAB — CBC
HCT: 45.4 % (ref 35.0–47.0)
HEMOGLOBIN: 16 g/dL (ref 12.0–16.0)
MCH: 33.6 pg (ref 26.0–34.0)
MCHC: 35.2 g/dL (ref 32.0–36.0)
MCV: 95.3 fL (ref 80.0–100.0)
PLATELETS: 314 10*3/uL (ref 150–440)
RBC: 4.76 MIL/uL (ref 3.80–5.20)
RDW: 12 % (ref 11.5–14.5)
WBC: 6.7 10*3/uL (ref 3.6–11.0)

## 2016-09-20 LAB — BASIC METABOLIC PANEL
ANION GAP: 9 (ref 5–15)
BUN: 15 mg/dL (ref 6–20)
CHLORIDE: 99 mmol/L — AB (ref 101–111)
CO2: 27 mmol/L (ref 22–32)
CREATININE: 0.95 mg/dL (ref 0.44–1.00)
Calcium: 9.5 mg/dL (ref 8.9–10.3)
GFR calc non Af Amer: 58 mL/min — ABNORMAL LOW (ref >60)
Glucose, Bld: 103 mg/dL — ABNORMAL HIGH (ref 65–99)
Potassium: 4.5 mmol/L (ref 3.5–5.1)
SODIUM: 135 mmol/L (ref 135–145)

## 2016-09-20 LAB — TROPONIN I

## 2016-09-20 NOTE — ED Triage Notes (Signed)
Pt reports chest pain started last night, worsening today while she was out shopping. C/o central chest pain radiating down to her left arm.  Pt is alert and oriented. Pt states she took a dose of her pain medication this morning for fibromyalgia.

## 2016-09-20 NOTE — ED Provider Notes (Signed)
[facility] Emergency Department Provider Note ____________________________________________   I have reviewed the triage vital signs and the triage nursing note.  HISTORY  Chief Complaint Chest Pain   Historian Patient  HPI Emily Harding is a 76 y.o. female with history of febrile to as well as hypertension, presents with chest pain that started overnight and is located in the epigastrium and then went up into the chest or to the left arm. She states that she's had pains like this before. She states that it feels little bit like fibromyalgia as she has pains all over at times.  No shortness of breath or nausea. No coughing or fevers.  A pleuritic chest pain.  Pain has been moderate and constant and feels aching in nature and similar to fibromyalgia pain.  Pain is moderate currently.  Nothing makes it worse or better.    Past Medical History:  Diagnosis Date  . Anxiety   . Arthritis   . Breast cancer ([place]) 1993   RT MASTECTOMY  . Heartburn   . HTN (hypertension)   . S/P chemotherapy, time since greater than 12 weeks 1993   BREAST CA    Patient Active Problem List   Diagnosis Date Noted  . Hyperlipidemia, unspecified 09/16/2016  . Fibromyalgia 09/16/2016  . Colon polyp 09/16/2016  . Breast cancer ([place]) 09/16/2016  . Difficulty sleeping 12/17/2015  . BPV (benign positional vertigo), unspecified laterality 12/17/2015  . Trochanteric bursitis of right hip 11/06/2015  . Numbness and tingling 11/06/2015  . Low back pain radiating to right leg 11/06/2015  . Hypertension 11/22/2014  . Hx of adenomatous colonic polyps 10/09/2013  . GERD (gastroesophageal reflux disease) 10/09/2013  . Dysphagia, unspecified 10/09/2013    Past Surgical History:  Procedure Laterality Date  . ABDOMINAL HYSTERECTOMY    . colon polyp removal    . GALLBLADDER SURGERY    . LAPAROSCOPIC OOPHERECTOMY  1978  . MASTECTOMY Right 1993   BREAST CA  . removal of  fallopian tubes  1989    Prior to Admission medications   Medication Sig Start Date End Date Taking? Authorizing Provider  aspirin EC 81 MG tablet Take 81 mg by mouth daily.    [provider]  atenolol (TENORMIN) 50 MG tablet Take 50 mg by mouth daily. 05/11/16   [provider]  calcium-vitamin D (SM CALCIUM 500/VITAMIN D3) 500-400 MG-UNIT tablet Take 1 tablet by mouth daily.    [provider]  celecoxib (CELEBREX) 200 MG capsule Take 200 mg by mouth daily. 05/04/16   [provider]  clorazepate (TRANXENE) 3.75 MG tablet Take 3.75 mg by mouth daily as needed. 05/19/16   [provider]  cyclobenzaprine (FLEXERIL) 10 MG tablet TAKE 1 TABLET (10 MG TOTAL) BY MOUTH 3 (THREE) TIMES DAILY AS NEEDED FOR MUSCLE SPASMS. 03/09/16   [provider]  esomeprazole (NEXIUM) 40 MG capsule Take by mouth.    [provider]  gabapentin (NEURONTIN) 400 MG capsule Take 400 mg by mouth 3 (three) times daily. 05/06/16   [provider]  HYDROcodone-acetaminophen (NORCO/VICODIN) 5-325 MG tablet Take 1 tablet by mouth every 6 (six) hours as needed. 05/26/16   [provider]  losartan (COZAAR) 50 MG tablet Take by mouth. 07/16/16 07/16/17  [provider]  meloxicam (MOBIC) 7.5 MG tablet Take 7.5 mg by mouth daily. 05/26/16   [provider]  Multiple Vitamin (MULTI-VITAMINS) TABS Take 1 tablet by mouth daily.    [provider]  promethazine (PHENERGAN) 25 MG tablet Take by mouth. 12/16/15   [provider]  traZODone (DESYREL) 100 MG tablet Take 100 mg by mouth at bedtime.    [provider]    Allergies  Allergen Reactions  . Azithromycin   . Codeine Sulfate Other (See Comments)  . Doxycycline Calcium Other (See Comments)  . Duloxetine Nausea Only  . Erythromycin Ethylsuccinate Other (See Comments)  . Macrolides And Ketolides Other (See Comments)  . Nitrofurantoin     Other reaction(s):  Unknown  . Sulfa Antibiotics     Family History  Problem Relation Age of Onset  . Breast cancer Neg Hx   . Kidney cancer Neg Hx   . Bladder Cancer Neg Hx   . Prostate cancer Neg Hx     Social History Social History  Substance Use Topics  . Smoking status: Never Smoker  . Smokeless tobacco: Never Used  . Alcohol use No    Review of Systems  Constitutional: Negative for fever. Eyes: Negative for visual changes. ENT: Negative for sore throat. Cardiovascular: Positive for chest pain. Respiratory: Negative for shortness of breath. Gastrointestinal: Negative for abdominal pain, vomiting and diarrhea. Genitourinary: Negative for dysuria. Musculoskeletal: Positive for chronic back pain.  Skin: Negative for rash. Neurological: Negative for headache.  ____________________________________________   PHYSICAL EXAM:  VITAL SIGNS: ED Triage Vitals  Enc Vitals Group     BP 09/20/16 1200 (!) 167/86     Pulse Rate 09/20/16 1200 72     Resp 09/20/16 1200 20     Temp 09/20/16 1200 98.7 F (37.1 C)     Temp Source 09/20/16 1200 Oral     SpO2 09/20/16 1200 97 %     Weight 09/20/16 1201 153 lb (69.4 kg)     Height 09/20/16 1201 5\' 3"  (1.6 m)     Head Circumference --      Peak Flow --      Pain Score 09/20/16 1200 8     Pain Loc --      Pain Edu? --      Excl. in [place]? --      Constitutional: Alert and oriented. Well appearing and in no distress. HEENT   Head: Normocephalic and atraumatic.      Eyes: Conjunctivae are normal. Pupils equal and round.       Ears:         Nose: No congestion/rhinnorhea.   Mouth/Throat: Mucous membranes are moist.   Neck: No stridor. Cardiovascular/Chest: Normal rate, regular rhythm.  No murmurs, rubs, or gallops. Respiratory: Normal respiratory effort without tachypnea nor retractions. Breath sounds are clear and equal bilaterally. No wheezes/rales/rhonchi. Gastrointestinal: Soft. No distention, no guarding, no rebound. Nontender.     Genitourinary/rectal:Deferred Musculoskeletal: Nontender with normal range of motion in all extremities. No joint effusions.  No lower extremity tenderness.  No edema. Neurologic:  Normal speech and language. No gross or focal neurologic deficits are appreciated. Skin:  Skin is warm, dry and intact. No rash noted. Psychiatric: Mood and affect are normal. Speech and behavior are normal. Patient exhibits appropriate insight and judgment.   ____________________________________________  LABS (pertinent positives/negatives)  Labs Reviewed  BASIC METABOLIC PANEL - Abnormal; Notable for the following:       Result Value   Chloride 99 (*)    Glucose, Bld 103 (*)    GFR calc non Af Amer 58 (*)    All other components within normal limits  CBC  TROPONIN I    ____________________________________________  EKG I, Lisa Roca, MD, the attending physician have personally viewed and interpreted all ECGs.  72 bpm. Normal sinus rhythm. Left bundle branch block. Bundle blanch block is old. ____________________________________________  RADIOLOGY All Xrays were viewed by me. Imaging interpreted by Radiologist.  Chest x-ray two-view:  IMPRESSION: Bronchitic changes with minimal atelectasis versus scarring in lingula. __________________________________________  PROCEDURES  Procedure(s) performed: None  Critical Care performed: None  ____________________________________________   ED COURSE / ASSESSMENT AND PLAN  Pertinent labs & imaging results that were available during my care of the patient were reviewed by me and considered in my medical decision making (see chart for details).    Emily Harding is here for nonspecific chest discomfort which has been ongoing since overnight with EKG is unchanged from prior with a left bundle branch block, and reassuring laboratory evaluation including negative troponin.  Patient is ready to discharge home and is requesting to leave  immediately.  Did discuss with her that she should follow-up with cardiologist as well as her private care doctor.      CONSULTATIONS:   None   Patient / Family / Caregiver informed of clinical course, medical decision-making process, and agree with plan.   I discussed return precautions, follow-up instructions, and discharge instructions with patient and/or family.  Discharge Instructions : Exam is reassuring in the emergency department today. Return to the emergency room immediately for any worsening chest pain, trouble breathing, shortness breath, or any other symptoms concerning to you.  ___________________________________________   FINAL CLINICAL IMPRESSION(S) / ED DIAGNOSES   Final diagnoses:  Nonspecific chest pain              Note: This dictation was prepared with Dragon dictation. Any transcriptional errors that result from this process are unintentional    Lisa Roca, MD 09/20/16 1610

## 2016-09-20 NOTE — ED Notes (Signed)
Brought pt in wheelchair to nursing desk. MD in process of finishing discharge paperwork. Pt got out of chair and started walking toward exit saying she is leaving. Attempted to explain to pt need for transport since she states is still feeling dizzy but pt refuses wheelchair at this time. Pt stopped by another pt's room to chat, EDP finished paperwork which was given to pt prior to leaving ED. Pt ambulatory with steady gait at this time.

## 2016-09-20 NOTE — ED Notes (Addendum)
EDP at bedside. Pt maintains that she would like to leave now. EDP stated she is going to write pt's discharge instructions up right now. Pt asked if she would like a wheelchair back to lobby. Pt prefers wheelchair d/t feeling somewhat dizzy.

## 2016-09-20 NOTE — ED Notes (Addendum)
Pt came into other pt's room to notify this nurse that she is going to leave. Redirected pt back into her own room. Pt states she has her son with her who has multiple medical issues and he is unable to wait any longer. States she wants to leave. Asked pt to wait for EDP to come and explain pt's tests results and form a plan of care. Pt agreeable to wait briefly for MD.

## 2016-09-20 NOTE — ED Notes (Signed)

## 2016-09-20 NOTE — Discharge Instructions (Signed)
Exam is reassuring in the emergency department today. Return to the emergency room immediately for any worsening chest pain, trouble breathing, shortness breath, or any other symptoms concerning to you.

## 2016-09-21 ENCOUNTER — Telehealth: Payer: Self-pay

## 2016-09-21 ENCOUNTER — Encounter: Payer: Self-pay | Admitting: Urology

## 2016-09-21 ENCOUNTER — Ambulatory Visit (INDEPENDENT_AMBULATORY_CARE_PROVIDER_SITE_OTHER): Payer: PPO | Admitting: Urology

## 2016-09-21 ENCOUNTER — Telehealth: Payer: Self-pay | Admitting: Urology

## 2016-09-21 VITALS — BP 156/78 | HR 71 | Ht 63.0 in | Wt 149.6 lb

## 2016-09-21 DIAGNOSIS — N3946 Mixed incontinence: Secondary | ICD-10-CM

## 2016-09-21 LAB — URINALYSIS, COMPLETE
Bilirubin, UA: NEGATIVE
Glucose, UA: NEGATIVE
KETONES UA: NEGATIVE
Nitrite, UA: POSITIVE — AB
PH UA: 6 (ref 5.0–7.5)
Protein, UA: NEGATIVE
RBC, UA: NEGATIVE
Urobilinogen, Ur: 0.2 mg/dL (ref 0.2–1.0)

## 2016-09-21 LAB — MICROSCOPIC EXAMINATION
BACTERIA UA: NONE SEEN
RBC, UA: NONE SEEN /hpf (ref 0–?)

## 2016-09-21 NOTE — Addendum Note (Signed)
Addended by: Kyra Manges on: 09/21/2016 03:00 PM   Modules accepted: Orders

## 2016-09-21 NOTE — Telephone Encounter (Signed)
Spoke to patient to make an ED fu  She states she will follow up with [facility]

## 2016-09-21 NOTE — Progress Notes (Signed)
09/21/2016 2:18 PM   Emily Harding 10-04-1940 [id number]  Referring provider: Madelyn Brunner, MD [facility] [place], [place] [address]  Chief Complaint  Patient presents with  . Recurrent UTI    HPI: Emily Harding: Patient referred for urinary tract infections. She was wearing multiple pads for mixed incontinence. She was offered behavioral therapy for her mixed incontinence and cystocele noted. The patient was subsequently assessed on June 27 by Dr. Erlene Quan but I did not see a complete note. She was given a beta 3 agonists but apparently did not want to take it. She told the nurses she wanted surgery. Yesterday she went to the emergency room with chest pain and there is no dictation of having fibromyalgia. She is allergic to a number of medications including nitrofurantoin and sulfa drugs.  Today The patient has significant urinary incontinence worsening over many years. She leaks with coughing sneezing bending and lifting. She leaks with walking. She has urgency incontinence and both are significant. She has high-volume bedwetting and double pads. She can soak 8 pads during the day and also double pads  She voids every 1 hour and cannot hold it for 2 hours. She gets up once at night  She did describe recurrent bladder infections with burning and chills respond favorably antibiotics. There were not positive cultures that I saw in the chart.   She has fibromyalgia. She tends to describe a lot of symptoms.She is prone to constipation. She never tried the B3 Agnes inferior of hypertension and other side effects. She has bilateral low back pain and just had injection  Modifying factors: There are no other modifying factors  Associated signs and symptoms: There are no other associated signs and symptoms Aggravating and relieving factors: There are no other aggravating or relieving factors Severity: Moderate Duration: Persistent   PMH: Past Medical  History:  Diagnosis Date  . Anxiety   . Arthritis   . Breast cancer ([place]) 1993   RT MASTECTOMY  . Heartburn   . HTN (hypertension)   . S/P chemotherapy, time since greater than 12 weeks 1993   BREAST CA    Surgical History: Past Surgical History:  Procedure Laterality Date  . ABDOMINAL HYSTERECTOMY    . colon polyp removal    . GALLBLADDER SURGERY    . LAPAROSCOPIC OOPHERECTOMY  1978  . MASTECTOMY Right 1993   BREAST CA  . removal of fallopian tubes  1989    Home Medications:  Allergies as of 09/21/2016      Reactions   Azithromycin    Codeine Sulfate Other (See Comments)   Doxycycline Calcium Other (See Comments)   Duloxetine Nausea Only   Erythromycin Ethylsuccinate Other (See Comments)   Macrolides And Ketolides Other (See Comments)   Nitrofurantoin    Other reaction(s): Unknown   Sulfa Antibiotics       Medication List       Accurate as of 09/21/16  2:18 PM. Always use your most recent med list.          aspirin EC 81 MG tablet Take 81 mg by mouth daily.   atenolol 50 MG tablet Commonly known as:  TENORMIN Take 50 mg by mouth daily.   celecoxib 200 MG capsule Commonly known as:  CELEBREX Take 200 mg by mouth daily.   clorazepate 3.75 MG tablet Commonly known as:  TRANXENE Take 3.75 mg by mouth daily as needed.   cyclobenzaprine 10 MG tablet Commonly known as:  FLEXERIL TAKE 1 TABLET (10 MG TOTAL) BY MOUTH 3 (THREE) TIMES DAILY AS NEEDED FOR MUSCLE SPASMS.   esomeprazole 40 MG capsule Commonly known as:  NEXIUM Take by mouth.   gabapentin 400 MG capsule Commonly known as:  NEURONTIN Take 400 mg by mouth 3 (three) times daily.   HYDROcodone-acetaminophen 5-325 MG tablet Commonly known as:  NORCO/VICODIN Take 1 tablet by mouth every 6 (six) hours as needed.   losartan 50 MG tablet Commonly known as:  COZAAR Take by mouth.   meloxicam 7.5 MG tablet Commonly known as:  MOBIC Take 7.5 mg by mouth daily.   MULTI-VITAMINS Tabs Take 1  tablet by mouth daily.   promethazine 25 MG tablet Commonly known as:  PHENERGAN Take by mouth.   SM CALCIUM 500/VITAMIN D3 500-400 MG-UNIT tablet Generic drug:  calcium-vitamin D Take 1 tablet by mouth daily.   traZODone 100 MG tablet Commonly known as:  DESYREL Take 100 mg by mouth at bedtime.       Allergies:  Allergies  Allergen Reactions  . Azithromycin   . Codeine Sulfate Other (See Comments)  . Doxycycline Calcium Other (See Comments)  . Duloxetine Nausea Only  . Erythromycin Ethylsuccinate Other (See Comments)  . Macrolides And Ketolides Other (See Comments)  . Nitrofurantoin     Other reaction(s): Unknown  . Sulfa Antibiotics     Family History: Family History  Problem Relation Age of Onset  . Breast cancer Neg Hx   . Kidney cancer Neg Hx   . Bladder Cancer Neg Hx   . Prostate cancer Neg Hx     Social History:  reports that she has never smoked. She has never used smokeless tobacco. She reports that she does not drink alcohol or use drugs.  ROS: UROLOGY Frequent Urination?: Yes Hard to postpone urination?: Yes Burning/pain with urination?: Yes Get up at night to urinate?: Yes Leakage of urine?: Yes Urine stream starts and stops?: No Trouble starting stream?: No Do you have to strain to urinate?: No Blood in urine?: No Urinary tract infection?: Yes Sexually transmitted disease?: No Injury to kidneys or bladder?: No Painful intercourse?: No Weak stream?: No Currently pregnant?: No Vaginal bleeding?: No Last menstrual period?: n  Gastrointestinal Nausea?: Yes Vomiting?: No Indigestion/heartburn?: Yes Diarrhea?: No Constipation?: Yes  Constitutional Fever: No Night sweats?: No Weight loss?: No Fatigue?: Yes  Skin Skin rash/lesions?: No Itching?: No  Eyes Blurred vision?: No Double vision?: No  Ears/Nose/Throat Sore throat?: No Sinus problems?: No  Hematologic/Lymphatic Swollen glands?: No Easy bruising?:  Yes  Cardiovascular Leg swelling?: No Chest pain?: Yes  Respiratory Cough?: No Shortness of breath?: No  Endocrine Excessive thirst?: No  Musculoskeletal Back pain?: Yes Joint pain?: Yes  Neurological Headaches?: No Dizziness?: Yes  Psychologic Depression?: No Anxiety?: No  Physical Exam: BP (!) 156/78 (BP Location: Left Arm, Patient Position: Sitting, Cuff Size: Normal)   Pulse 71   Ht 5\' 3"  (1.6 m)   Wt 149 lb 9.6 oz (67.9 kg)   BMI 26.50 kg/m   Constitutional:  Alert and oriented, No acute distress. HEENT: [place] AT, moist mucus membranes.  Trachea midline, no masses. Cardiovascular: No clubbing, cyanosis, or edema. Respiratory: Normal respiratory effort, no increased work of breathing. GI: Abdomen is soft, nontender, nondistended, no abdominal masses GU: Mild grade 2 hypermobility of the bladder neck and a moderate positive cough test. She may have triggered a contraction but probably was not. She had a small grade 2 cystocele and is not at  increased risk of a hinge effect. She had no rectocele. She had moderate vaginal atrophy Skin: No rashes, bruises or suspicious lesions. Lymph: No cervical or inguinal adenopathy. Neurologic: Grossly intact, no focal deficits, moving all 4 extremities. Psychiatric: Normal mood and affect.  Laboratory Data: Lab Results  Component Value Date   WBC 6.7 09/20/2016   HGB 16.0 09/20/2016   HCT 45.4 09/20/2016   MCV 95.3 09/20/2016   PLT 314 09/20/2016    Lab Results  Component Value Date   CREATININE 0.95 09/20/2016    No results found for: PSA  No results found for: TESTOSTERONE  No results found for: HGBA1C  Urinalysis    Component Value Date/Time   COLORURINE COLORLESS (A) 05/27/2016 1747   APPEARANCEUR Clear 09/16/2016 1046   LABSPEC 1.003 (L) 05/27/2016 1747   PHURINE 6.0 05/27/2016 1747   GLUCOSEU Negative 09/16/2016 1046   HGBUR SMALL (A) 05/27/2016 1747   BILIRUBINUR Negative 09/16/2016 1046   KETONESUR  NEGATIVE 05/27/2016 1747   PROTEINUR Negative 09/16/2016 1046   PROTEINUR NEGATIVE 05/27/2016 1747   NITRITE Negative 09/16/2016 1046   NITRITE NEGATIVE 05/27/2016 1747   LEUKOCYTESUR Negative 09/16/2016 1046    Pertinent Imaging: none  Assessment & Plan:  The patient has mixed incontinence. She hasn't can frequency and high-volume bedwetting as well. She has fibromyalgia and a lot of medical complaints and may have recurrent bladder infections. Urine was sent for culture.  I educated the patient regarding her incontinence. She does have stress incontinence by history and physical but there is no question she has significant overactive bladder with high-volume bedwetting. Recurrent bladder infections may also be a culprit. The role of urodynamics and cystoscopy was discussed. I had to place the educated her that we cannot just jump into surgery to fix all of her problems. At this point a urethral injectable might also be a reasonable way to address her stress incontinence depending upon other findings in the future.  The patient recently had a CT scan within normal limits. She was attentive when I counseled her.  Her behavior in the clinic was noted  1. Mixed incontinence 2. Urinary frequency - Urinalysis, Complete - Bladder Scan (Post Void Residual) in office   No Follow-up on file.  Reece Packer, MD  [facility] [address] [place], [place] [address] [phone]

## 2016-09-21 NOTE — Telephone Encounter (Signed)
Pt was seen in the office earlier today, Pt is calling office now stating that she has a lot of questions and is very confused and concerned about what was told to her at her visit.  Pt is asking that you call her back and explain to her why she can't have surgery and is there something that can be done if the "tests" reveal she doesn't need surgery, also asking about insurance coverage for the UDS,

## 2016-09-23 LAB — CULTURE, URINE COMPREHENSIVE

## 2016-09-24 DIAGNOSIS — H9311 Tinnitus, right ear: Secondary | ICD-10-CM | POA: Diagnosis not present

## 2016-09-24 DIAGNOSIS — M50322 Other cervical disc degeneration at C5-C6 level: Secondary | ICD-10-CM | POA: Diagnosis not present

## 2016-09-24 DIAGNOSIS — M1611 Unilateral primary osteoarthritis, right hip: Secondary | ICD-10-CM | POA: Diagnosis not present

## 2016-09-24 DIAGNOSIS — M542 Cervicalgia: Secondary | ICD-10-CM | POA: Diagnosis not present

## 2016-09-24 DIAGNOSIS — M25551 Pain in right hip: Secondary | ICD-10-CM | POA: Diagnosis not present

## 2016-09-25 NOTE — Telephone Encounter (Signed)
It was explained in detail what testing was ordered and why patient should keep her apt and follow through with testing.

## 2016-09-25 NOTE — Telephone Encounter (Signed)
-----   Message from Bjorn Loser, MD sent at 09/25/2016  8:20 AM EDT ----- Emily Harding This patient did not have cystitis symptoms Based on allergies I will not treat Can you make a note in the chart regarding this so I will keep this in mind for next visit thanks   ----- Message ----- From: Royanne Foots, [place]: 09/24/2016   9:53 AM To: Bjorn Loser, MD    ----- Message ----- From: Interface, [facility] Lab Results In Sent: 09/21/2016   4:38 PM To: Rowe Robert Clinical

## 2016-09-30 ENCOUNTER — Other Ambulatory Visit: Payer: Self-pay | Admitting: Physician Assistant

## 2016-09-30 DIAGNOSIS — R42 Dizziness and giddiness: Secondary | ICD-10-CM

## 2016-09-30 DIAGNOSIS — R5381 Other malaise: Secondary | ICD-10-CM | POA: Diagnosis not present

## 2016-09-30 DIAGNOSIS — G44319 Acute post-traumatic headache, not intractable: Secondary | ICD-10-CM

## 2016-09-30 DIAGNOSIS — R110 Nausea: Secondary | ICD-10-CM | POA: Diagnosis not present

## 2016-10-01 ENCOUNTER — Ambulatory Visit: Payer: PPO

## 2016-10-01 ENCOUNTER — Ambulatory Visit
Admission: RE | Admit: 2016-10-01 | Discharge: 2016-10-01 | Disposition: A | Discharge status: Home / Self Care | Payer: PPO | Source: Ambulatory Visit | Attending: Physician Assistant | Admitting: Physician Assistant

## 2016-10-01 DIAGNOSIS — R51 Headache: Secondary | ICD-10-CM | POA: Diagnosis not present

## 2016-10-01 DIAGNOSIS — R930 Abnormal findings on diagnostic imaging of skull and head, not elsewhere classified: Secondary | ICD-10-CM | POA: Diagnosis not present

## 2016-10-01 DIAGNOSIS — G44319 Acute post-traumatic headache, not intractable: Secondary | ICD-10-CM | POA: Insufficient documentation

## 2016-10-01 DIAGNOSIS — R42 Dizziness and giddiness: Secondary | ICD-10-CM

## 2016-10-05 DIAGNOSIS — R42 Dizziness and giddiness: Secondary | ICD-10-CM | POA: Diagnosis not present

## 2016-10-05 DIAGNOSIS — I10 Essential (primary) hypertension: Secondary | ICD-10-CM | POA: Diagnosis not present

## 2016-10-05 DIAGNOSIS — F411 Generalized anxiety disorder: Secondary | ICD-10-CM | POA: Diagnosis not present

## 2016-10-06 ENCOUNTER — Other Ambulatory Visit: Payer: Self-pay | Admitting: Internal Medicine

## 2016-10-06 DIAGNOSIS — Z1231 Encounter for screening mammogram for malignant neoplasm of breast: Secondary | ICD-10-CM

## 2016-10-06 DIAGNOSIS — R202 Paresthesia of skin: Secondary | ICD-10-CM | POA: Diagnosis not present

## 2016-10-06 DIAGNOSIS — H8110 Benign paroxysmal vertigo, unspecified ear: Secondary | ICD-10-CM | POA: Diagnosis not present

## 2016-10-06 DIAGNOSIS — R200 Anesthesia of skin: Secondary | ICD-10-CM | POA: Diagnosis not present

## 2016-10-06 DIAGNOSIS — G479 Sleep disorder, unspecified: Secondary | ICD-10-CM | POA: Diagnosis not present

## 2016-10-06 DIAGNOSIS — M545 Low back pain: Secondary | ICD-10-CM | POA: Diagnosis not present

## 2016-10-06 DIAGNOSIS — R7309 Other abnormal glucose: Secondary | ICD-10-CM | POA: Diagnosis not present

## 2016-10-07 ENCOUNTER — Ambulatory Visit: Payer: Self-pay | Admitting: Urology

## 2016-10-19 ENCOUNTER — Ambulatory Visit: Payer: Self-pay

## 2016-10-19 ENCOUNTER — Other Ambulatory Visit: Payer: Self-pay

## 2016-10-23 DIAGNOSIS — L03811 Cellulitis of head [any part, except face]: Secondary | ICD-10-CM | POA: Diagnosis not present

## 2016-10-23 DIAGNOSIS — R300 Dysuria: Secondary | ICD-10-CM | POA: Diagnosis not present

## 2016-10-30 DIAGNOSIS — H18831 Recurrent erosion of cornea, right eye: Secondary | ICD-10-CM | POA: Diagnosis not present

## 2016-11-04 DIAGNOSIS — L578 Other skin changes due to chronic exposure to nonionizing radiation: Secondary | ICD-10-CM | POA: Diagnosis not present

## 2016-11-04 DIAGNOSIS — H61892 Other specified disorders of left external ear: Secondary | ICD-10-CM | POA: Diagnosis not present

## 2016-11-04 DIAGNOSIS — D485 Neoplasm of uncertain behavior of skin: Secondary | ICD-10-CM | POA: Diagnosis not present

## 2016-11-04 DIAGNOSIS — L98499 Non-pressure chronic ulcer of skin of other sites with unspecified severity: Secondary | ICD-10-CM | POA: Diagnosis not present

## 2016-11-05 ENCOUNTER — Ambulatory Visit: Payer: Self-pay | Admitting: Urology

## 2016-11-11 DIAGNOSIS — M5416 Radiculopathy, lumbar region: Secondary | ICD-10-CM | POA: Diagnosis not present

## 2016-11-11 DIAGNOSIS — M5136 Other intervertebral disc degeneration, lumbar region: Secondary | ICD-10-CM | POA: Diagnosis not present

## 2016-11-12 DIAGNOSIS — L219 Seborrheic dermatitis, unspecified: Secondary | ICD-10-CM | POA: Diagnosis not present

## 2016-11-12 DIAGNOSIS — H61002 Unspecified perichondritis of left external ear: Secondary | ICD-10-CM | POA: Diagnosis not present

## 2016-11-17 ENCOUNTER — Ambulatory Visit
Admission: RE | Admit: 2016-11-17 | Discharge: 2016-11-17 | Disposition: A | Discharge status: Home / Self Care | Payer: PPO | Source: Ambulatory Visit | Attending: Internal Medicine | Admitting: Internal Medicine

## 2016-11-17 DIAGNOSIS — Z1231 Encounter for screening mammogram for malignant neoplasm of breast: Secondary | ICD-10-CM | POA: Diagnosis not present

## 2016-11-18 DIAGNOSIS — Z961 Presence of intraocular lens: Secondary | ICD-10-CM | POA: Diagnosis not present

## 2016-11-30 DIAGNOSIS — D229 Melanocytic nevi, unspecified: Secondary | ICD-10-CM | POA: Diagnosis not present

## 2016-11-30 DIAGNOSIS — L578 Other skin changes due to chronic exposure to nonionizing radiation: Secondary | ICD-10-CM | POA: Diagnosis not present

## 2016-11-30 DIAGNOSIS — L812 Freckles: Secondary | ICD-10-CM | POA: Diagnosis not present

## 2016-11-30 DIAGNOSIS — L821 Other seborrheic keratosis: Secondary | ICD-10-CM | POA: Diagnosis not present

## 2016-11-30 DIAGNOSIS — L219 Seborrheic dermatitis, unspecified: Secondary | ICD-10-CM | POA: Diagnosis not present

## 2016-11-30 DIAGNOSIS — Z1283 Encounter for screening for malignant neoplasm of skin: Secondary | ICD-10-CM | POA: Diagnosis not present

## 2016-12-07 DIAGNOSIS — H61002 Unspecified perichondritis of left external ear: Secondary | ICD-10-CM | POA: Diagnosis not present

## 2017-01-05 DIAGNOSIS — R300 Dysuria: Secondary | ICD-10-CM | POA: Diagnosis not present

## 2017-01-12 DIAGNOSIS — I10 Essential (primary) hypertension: Secondary | ICD-10-CM | POA: Diagnosis not present

## 2017-01-18 DIAGNOSIS — J40 Bronchitis, not specified as acute or chronic: Secondary | ICD-10-CM | POA: Diagnosis not present

## 2017-01-18 DIAGNOSIS — I10 Essential (primary) hypertension: Secondary | ICD-10-CM | POA: Diagnosis not present

## 2017-01-29 DIAGNOSIS — M5416 Radiculopathy, lumbar region: Secondary | ICD-10-CM | POA: Diagnosis not present

## 2017-01-29 DIAGNOSIS — M5136 Other intervertebral disc degeneration, lumbar region: Secondary | ICD-10-CM | POA: Diagnosis not present

## 2017-02-18 DIAGNOSIS — R32 Unspecified urinary incontinence: Secondary | ICD-10-CM | POA: Diagnosis not present

## 2017-02-18 DIAGNOSIS — I10 Essential (primary) hypertension: Secondary | ICD-10-CM | POA: Diagnosis not present

## 2017-02-18 DIAGNOSIS — E7800 Pure hypercholesterolemia, unspecified: Secondary | ICD-10-CM | POA: Diagnosis not present

## 2017-02-18 DIAGNOSIS — K219 Gastro-esophageal reflux disease without esophagitis: Secondary | ICD-10-CM | POA: Diagnosis not present

## 2017-02-18 DIAGNOSIS — M545 Low back pain: Secondary | ICD-10-CM | POA: Diagnosis not present

## 2017-02-18 DIAGNOSIS — Z5181 Encounter for therapeutic drug level monitoring: Secondary | ICD-10-CM | POA: Diagnosis not present

## 2017-02-18 DIAGNOSIS — Z0001 Encounter for general adult medical examination with abnormal findings: Secondary | ICD-10-CM | POA: Diagnosis not present

## 2017-02-24 DIAGNOSIS — R300 Dysuria: Secondary | ICD-10-CM | POA: Diagnosis not present

## 2017-03-22 DIAGNOSIS — Z4431 Encounter for fitting and adjustment of external right breast prosthesis: Secondary | ICD-10-CM | POA: Diagnosis not present

## 2017-03-22 DIAGNOSIS — C50111 Malignant neoplasm of central portion of right female breast: Secondary | ICD-10-CM | POA: Diagnosis not present

## 2017-04-02 DIAGNOSIS — M5416 Radiculopathy, lumbar region: Secondary | ICD-10-CM | POA: Diagnosis not present

## 2017-04-02 DIAGNOSIS — M5136 Other intervertebral disc degeneration, lumbar region: Secondary | ICD-10-CM | POA: Diagnosis not present

## 2017-04-02 DIAGNOSIS — M62830 Muscle spasm of back: Secondary | ICD-10-CM | POA: Diagnosis not present

## 2017-04-08 DIAGNOSIS — H61002 Unspecified perichondritis of left external ear: Secondary | ICD-10-CM | POA: Diagnosis not present

## 2017-04-08 DIAGNOSIS — L600 Ingrowing nail: Secondary | ICD-10-CM | POA: Diagnosis not present

## 2017-04-13 DIAGNOSIS — N390 Urinary tract infection, site not specified: Secondary | ICD-10-CM | POA: Diagnosis not present

## 2017-04-13 DIAGNOSIS — B379 Candidiasis, unspecified: Secondary | ICD-10-CM | POA: Diagnosis not present

## 2017-04-20 DIAGNOSIS — R42 Dizziness and giddiness: Secondary | ICD-10-CM | POA: Diagnosis not present

## 2017-04-20 DIAGNOSIS — M545 Low back pain, unspecified: Secondary | ICD-10-CM | POA: Insufficient documentation

## 2017-04-27 DIAGNOSIS — N390 Urinary tract infection, site not specified: Secondary | ICD-10-CM | POA: Diagnosis not present

## 2017-05-04 DIAGNOSIS — M5136 Other intervertebral disc degeneration, lumbar region: Secondary | ICD-10-CM | POA: Diagnosis not present

## 2017-05-04 DIAGNOSIS — M5416 Radiculopathy, lumbar region: Secondary | ICD-10-CM | POA: Diagnosis not present

## 2017-05-10 DIAGNOSIS — I10 Essential (primary) hypertension: Secondary | ICD-10-CM | POA: Diagnosis not present

## 2017-05-10 DIAGNOSIS — N390 Urinary tract infection, site not specified: Secondary | ICD-10-CM | POA: Diagnosis not present

## 2017-05-14 DIAGNOSIS — L600 Ingrowing nail: Secondary | ICD-10-CM | POA: Diagnosis not present

## 2017-05-14 DIAGNOSIS — M79675 Pain in left toe(s): Secondary | ICD-10-CM | POA: Diagnosis not present

## 2017-05-14 DIAGNOSIS — M79674 Pain in right toe(s): Secondary | ICD-10-CM | POA: Diagnosis not present

## 2017-05-14 DIAGNOSIS — B351 Tinea unguium: Secondary | ICD-10-CM | POA: Diagnosis not present

## 2017-05-17 ENCOUNTER — Ambulatory Visit: Payer: Self-pay | Admitting: Urology

## 2017-05-17 ENCOUNTER — Ambulatory Visit: Payer: Self-pay

## 2017-05-27 ENCOUNTER — Ambulatory Visit: Payer: Self-pay | Admitting: Urology

## 2017-05-31 ENCOUNTER — Ambulatory Visit: Payer: Self-pay

## 2017-06-01 DIAGNOSIS — M5416 Radiculopathy, lumbar region: Secondary | ICD-10-CM | POA: Diagnosis not present

## 2017-06-01 DIAGNOSIS — M5136 Other intervertebral disc degeneration, lumbar region: Secondary | ICD-10-CM | POA: Diagnosis not present

## 2017-06-29 DIAGNOSIS — M5136 Other intervertebral disc degeneration, lumbar region: Secondary | ICD-10-CM | POA: Diagnosis not present

## 2017-06-29 DIAGNOSIS — M5416 Radiculopathy, lumbar region: Secondary | ICD-10-CM | POA: Diagnosis not present

## 2017-06-29 DIAGNOSIS — M62830 Muscle spasm of back: Secondary | ICD-10-CM | POA: Diagnosis not present

## 2017-07-28 DIAGNOSIS — Z131 Encounter for screening for diabetes mellitus: Secondary | ICD-10-CM | POA: Diagnosis not present

## 2017-07-28 DIAGNOSIS — R634 Abnormal weight loss: Secondary | ICD-10-CM | POA: Diagnosis not present

## 2017-08-12 DIAGNOSIS — Z5181 Encounter for therapeutic drug level monitoring: Secondary | ICD-10-CM | POA: Diagnosis not present

## 2017-08-12 DIAGNOSIS — E7800 Pure hypercholesterolemia, unspecified: Secondary | ICD-10-CM | POA: Diagnosis not present

## 2017-08-19 DIAGNOSIS — K219 Gastro-esophageal reflux disease without esophagitis: Secondary | ICD-10-CM | POA: Diagnosis not present

## 2017-08-19 DIAGNOSIS — I10 Essential (primary) hypertension: Secondary | ICD-10-CM | POA: Diagnosis not present

## 2017-08-19 DIAGNOSIS — E7800 Pure hypercholesterolemia, unspecified: Secondary | ICD-10-CM | POA: Diagnosis not present

## 2017-08-19 DIAGNOSIS — H8110 Benign paroxysmal vertigo, unspecified ear: Secondary | ICD-10-CM | POA: Diagnosis not present

## 2017-08-19 DIAGNOSIS — R300 Dysuria: Secondary | ICD-10-CM | POA: Diagnosis not present

## 2017-08-19 DIAGNOSIS — Z0000 Encounter for general adult medical examination without abnormal findings: Secondary | ICD-10-CM | POA: Diagnosis not present

## 2017-08-25 DIAGNOSIS — N3020 Other chronic cystitis without hematuria: Secondary | ICD-10-CM | POA: Diagnosis not present

## 2017-08-25 DIAGNOSIS — N3946 Mixed incontinence: Secondary | ICD-10-CM | POA: Diagnosis not present

## 2017-08-25 DIAGNOSIS — N3281 Overactive bladder: Secondary | ICD-10-CM | POA: Diagnosis not present

## 2017-09-03 DIAGNOSIS — M5136 Other intervertebral disc degeneration, lumbar region: Secondary | ICD-10-CM | POA: Diagnosis not present

## 2017-09-03 DIAGNOSIS — M5416 Radiculopathy, lumbar region: Secondary | ICD-10-CM | POA: Diagnosis not present

## 2017-09-27 DIAGNOSIS — Z961 Presence of intraocular lens: Secondary | ICD-10-CM | POA: Diagnosis not present

## 2017-09-29 DIAGNOSIS — M62830 Muscle spasm of back: Secondary | ICD-10-CM | POA: Diagnosis not present

## 2017-09-29 DIAGNOSIS — M5136 Other intervertebral disc degeneration, lumbar region: Secondary | ICD-10-CM | POA: Diagnosis not present

## 2017-09-29 DIAGNOSIS — M5416 Radiculopathy, lumbar region: Secondary | ICD-10-CM | POA: Diagnosis not present

## 2017-10-25 DIAGNOSIS — I10 Essential (primary) hypertension: Secondary | ICD-10-CM | POA: Diagnosis not present

## 2017-10-25 DIAGNOSIS — M545 Low back pain: Secondary | ICD-10-CM | POA: Diagnosis not present

## 2017-10-25 DIAGNOSIS — N390 Urinary tract infection, site not specified: Secondary | ICD-10-CM | POA: Diagnosis not present

## 2017-11-10 DIAGNOSIS — M5416 Radiculopathy, lumbar region: Secondary | ICD-10-CM | POA: Diagnosis not present

## 2017-11-10 DIAGNOSIS — M5136 Other intervertebral disc degeneration, lumbar region: Secondary | ICD-10-CM | POA: Diagnosis not present

## 2017-11-23 ENCOUNTER — Other Ambulatory Visit: Payer: Self-pay | Admitting: Internal Medicine

## 2017-11-23 DIAGNOSIS — Z1231 Encounter for screening mammogram for malignant neoplasm of breast: Secondary | ICD-10-CM

## 2017-12-03 DIAGNOSIS — J40 Bronchitis, not specified as acute or chronic: Secondary | ICD-10-CM | POA: Diagnosis not present

## 2017-12-07 ENCOUNTER — Ambulatory Visit: Payer: PPO

## 2017-12-20 ENCOUNTER — Ambulatory Visit
Admission: RE | Admit: 2017-12-20 | Discharge: 2017-12-20 | Disposition: A | Discharge status: Home / Self Care | Payer: PPO | Source: Ambulatory Visit | Attending: Internal Medicine | Admitting: Internal Medicine

## 2017-12-20 ENCOUNTER — Other Ambulatory Visit: Payer: Self-pay | Admitting: Internal Medicine

## 2017-12-20 DIAGNOSIS — Z1231 Encounter for screening mammogram for malignant neoplasm of breast: Secondary | ICD-10-CM

## 2017-12-20 HISTORY — DX: Personal history of antineoplastic chemotherapy: Z92.21

## 2017-12-29 DIAGNOSIS — M5416 Radiculopathy, lumbar region: Secondary | ICD-10-CM | POA: Diagnosis not present

## 2017-12-29 DIAGNOSIS — M62830 Muscle spasm of back: Secondary | ICD-10-CM | POA: Diagnosis not present

## 2017-12-29 DIAGNOSIS — M5136 Other intervertebral disc degeneration, lumbar region: Secondary | ICD-10-CM | POA: Diagnosis not present

## 2018-01-14 DIAGNOSIS — W010XXA Fall on same level from slipping, tripping and stumbling without subsequent striking against object, initial encounter: Secondary | ICD-10-CM | POA: Diagnosis not present

## 2018-01-14 DIAGNOSIS — M25561 Pain in right knee: Secondary | ICD-10-CM | POA: Diagnosis not present

## 2018-01-14 DIAGNOSIS — S80211A Abrasion, right knee, initial encounter: Secondary | ICD-10-CM | POA: Diagnosis not present

## 2018-01-14 DIAGNOSIS — S80212A Abrasion, left knee, initial encounter: Secondary | ICD-10-CM | POA: Diagnosis not present

## 2018-01-14 DIAGNOSIS — M25562 Pain in left knee: Secondary | ICD-10-CM | POA: Diagnosis not present

## 2018-01-14 DIAGNOSIS — I10 Essential (primary) hypertension: Secondary | ICD-10-CM | POA: Diagnosis not present

## 2018-02-09 DIAGNOSIS — E7800 Pure hypercholesterolemia, unspecified: Secondary | ICD-10-CM | POA: Diagnosis not present

## 2018-02-09 DIAGNOSIS — K219 Gastro-esophageal reflux disease without esophagitis: Secondary | ICD-10-CM | POA: Diagnosis not present

## 2018-02-09 DIAGNOSIS — M797 Fibromyalgia: Secondary | ICD-10-CM | POA: Diagnosis not present

## 2018-02-09 DIAGNOSIS — I10 Essential (primary) hypertension: Secondary | ICD-10-CM | POA: Diagnosis not present

## 2018-02-09 DIAGNOSIS — H8110 Benign paroxysmal vertigo, unspecified ear: Secondary | ICD-10-CM | POA: Diagnosis not present

## 2018-03-21 DIAGNOSIS — R399 Unspecified symptoms and signs involving the genitourinary system: Secondary | ICD-10-CM | POA: Diagnosis not present

## 2018-03-30 DIAGNOSIS — M62830 Muscle spasm of back: Secondary | ICD-10-CM | POA: Diagnosis not present

## 2018-03-30 DIAGNOSIS — M5416 Radiculopathy, lumbar region: Secondary | ICD-10-CM | POA: Diagnosis not present

## 2018-03-30 DIAGNOSIS — M5136 Other intervertebral disc degeneration, lumbar region: Secondary | ICD-10-CM | POA: Diagnosis not present

## 2018-05-02 DIAGNOSIS — R300 Dysuria: Secondary | ICD-10-CM | POA: Diagnosis not present

## 2018-05-20 DIAGNOSIS — M5136 Other intervertebral disc degeneration, lumbar region: Secondary | ICD-10-CM | POA: Diagnosis not present

## 2018-05-20 DIAGNOSIS — M5416 Radiculopathy, lumbar region: Secondary | ICD-10-CM | POA: Diagnosis not present

## 2018-07-27 DIAGNOSIS — M5136 Other intervertebral disc degeneration, lumbar region: Secondary | ICD-10-CM | POA: Diagnosis not present

## 2018-07-27 DIAGNOSIS — M5416 Radiculopathy, lumbar region: Secondary | ICD-10-CM | POA: Diagnosis not present

## 2018-09-15 DIAGNOSIS — I10 Essential (primary) hypertension: Secondary | ICD-10-CM | POA: Diagnosis not present

## 2018-09-15 DIAGNOSIS — Z0000 Encounter for general adult medical examination without abnormal findings: Secondary | ICD-10-CM | POA: Diagnosis not present

## 2018-09-15 DIAGNOSIS — E7800 Pure hypercholesterolemia, unspecified: Secondary | ICD-10-CM | POA: Diagnosis not present

## 2018-09-15 DIAGNOSIS — R7303 Prediabetes: Secondary | ICD-10-CM | POA: Diagnosis not present

## 2018-09-22 DIAGNOSIS — E7800 Pure hypercholesterolemia, unspecified: Secondary | ICD-10-CM | POA: Diagnosis not present

## 2018-09-22 DIAGNOSIS — R7303 Prediabetes: Secondary | ICD-10-CM | POA: Diagnosis not present

## 2018-09-22 DIAGNOSIS — Z0001 Encounter for general adult medical examination with abnormal findings: Secondary | ICD-10-CM | POA: Diagnosis not present

## 2018-09-22 DIAGNOSIS — I10 Essential (primary) hypertension: Secondary | ICD-10-CM | POA: Diagnosis not present

## 2018-09-22 DIAGNOSIS — K219 Gastro-esophageal reflux disease without esophagitis: Secondary | ICD-10-CM | POA: Diagnosis not present

## 2018-09-22 DIAGNOSIS — Z0000 Encounter for general adult medical examination without abnormal findings: Secondary | ICD-10-CM | POA: Diagnosis not present

## 2018-09-22 DIAGNOSIS — C50911 Malignant neoplasm of unspecified site of right female breast: Secondary | ICD-10-CM | POA: Diagnosis not present

## 2018-11-01 DIAGNOSIS — Z961 Presence of intraocular lens: Secondary | ICD-10-CM | POA: Diagnosis not present

## 2018-11-08 DIAGNOSIS — M5416 Radiculopathy, lumbar region: Secondary | ICD-10-CM | POA: Diagnosis not present

## 2018-11-08 DIAGNOSIS — M5136 Other intervertebral disc degeneration, lumbar region: Secondary | ICD-10-CM | POA: Diagnosis not present

## 2018-11-10 DIAGNOSIS — M5136 Other intervertebral disc degeneration, lumbar region: Secondary | ICD-10-CM | POA: Diagnosis not present

## 2018-11-10 DIAGNOSIS — M5416 Radiculopathy, lumbar region: Secondary | ICD-10-CM | POA: Diagnosis not present

## 2018-11-14 DIAGNOSIS — I10 Essential (primary) hypertension: Secondary | ICD-10-CM | POA: Diagnosis not present

## 2018-11-16 DIAGNOSIS — M5416 Radiculopathy, lumbar region: Secondary | ICD-10-CM | POA: Diagnosis not present

## 2018-11-16 DIAGNOSIS — M5136 Other intervertebral disc degeneration, lumbar region: Secondary | ICD-10-CM | POA: Diagnosis not present

## 2018-11-18 DIAGNOSIS — M5416 Radiculopathy, lumbar region: Secondary | ICD-10-CM | POA: Diagnosis not present

## 2018-11-18 DIAGNOSIS — M5136 Other intervertebral disc degeneration, lumbar region: Secondary | ICD-10-CM | POA: Diagnosis not present

## 2018-11-21 DIAGNOSIS — M5136 Other intervertebral disc degeneration, lumbar region: Secondary | ICD-10-CM | POA: Diagnosis not present

## 2018-11-21 DIAGNOSIS — M5416 Radiculopathy, lumbar region: Secondary | ICD-10-CM | POA: Diagnosis not present

## 2018-11-22 ENCOUNTER — Other Ambulatory Visit: Payer: Self-pay | Admitting: Internal Medicine

## 2018-11-22 DIAGNOSIS — Z1231 Encounter for screening mammogram for malignant neoplasm of breast: Secondary | ICD-10-CM

## 2018-11-24 DIAGNOSIS — M5416 Radiculopathy, lumbar region: Secondary | ICD-10-CM | POA: Diagnosis not present

## 2018-11-24 DIAGNOSIS — M5136 Other intervertebral disc degeneration, lumbar region: Secondary | ICD-10-CM | POA: Diagnosis not present

## 2018-11-29 DIAGNOSIS — M5136 Other intervertebral disc degeneration, lumbar region: Secondary | ICD-10-CM | POA: Diagnosis not present

## 2018-11-29 DIAGNOSIS — M5416 Radiculopathy, lumbar region: Secondary | ICD-10-CM | POA: Diagnosis not present

## 2018-12-02 DIAGNOSIS — M5136 Other intervertebral disc degeneration, lumbar region: Secondary | ICD-10-CM | POA: Diagnosis not present

## 2018-12-02 DIAGNOSIS — M5416 Radiculopathy, lumbar region: Secondary | ICD-10-CM | POA: Diagnosis not present

## 2018-12-05 DIAGNOSIS — M5416 Radiculopathy, lumbar region: Secondary | ICD-10-CM | POA: Diagnosis not present

## 2018-12-05 DIAGNOSIS — M5136 Other intervertebral disc degeneration, lumbar region: Secondary | ICD-10-CM | POA: Diagnosis not present

## 2018-12-08 DIAGNOSIS — M5416 Radiculopathy, lumbar region: Secondary | ICD-10-CM | POA: Diagnosis not present

## 2018-12-08 DIAGNOSIS — M5136 Other intervertebral disc degeneration, lumbar region: Secondary | ICD-10-CM | POA: Diagnosis not present

## 2018-12-14 DIAGNOSIS — M5416 Radiculopathy, lumbar region: Secondary | ICD-10-CM | POA: Diagnosis not present

## 2018-12-14 DIAGNOSIS — M5136 Other intervertebral disc degeneration, lumbar region: Secondary | ICD-10-CM | POA: Diagnosis not present

## 2018-12-16 DIAGNOSIS — M5136 Other intervertebral disc degeneration, lumbar region: Secondary | ICD-10-CM | POA: Diagnosis not present

## 2018-12-16 DIAGNOSIS — M5416 Radiculopathy, lumbar region: Secondary | ICD-10-CM | POA: Diagnosis not present

## 2018-12-19 DIAGNOSIS — M5416 Radiculopathy, lumbar region: Secondary | ICD-10-CM | POA: Diagnosis not present

## 2018-12-19 DIAGNOSIS — M5136 Other intervertebral disc degeneration, lumbar region: Secondary | ICD-10-CM | POA: Diagnosis not present

## 2018-12-22 DIAGNOSIS — M5416 Radiculopathy, lumbar region: Secondary | ICD-10-CM | POA: Diagnosis not present

## 2018-12-22 DIAGNOSIS — M5136 Other intervertebral disc degeneration, lumbar region: Secondary | ICD-10-CM | POA: Diagnosis not present

## 2018-12-26 DIAGNOSIS — M5136 Other intervertebral disc degeneration, lumbar region: Secondary | ICD-10-CM | POA: Diagnosis not present

## 2018-12-26 DIAGNOSIS — M5416 Radiculopathy, lumbar region: Secondary | ICD-10-CM | POA: Diagnosis not present

## 2018-12-28 ENCOUNTER — Ambulatory Visit
Admission: RE | Admit: 2018-12-28 | Discharge: 2018-12-28 | Disposition: A | Discharge status: Home / Self Care | Payer: PPO | Source: Ambulatory Visit | Attending: Internal Medicine | Admitting: Internal Medicine

## 2018-12-28 DIAGNOSIS — Z1231 Encounter for screening mammogram for malignant neoplasm of breast: Secondary | ICD-10-CM | POA: Insufficient documentation

## 2018-12-29 DIAGNOSIS — M5416 Radiculopathy, lumbar region: Secondary | ICD-10-CM | POA: Diagnosis not present

## 2018-12-29 DIAGNOSIS — M5136 Other intervertebral disc degeneration, lumbar region: Secondary | ICD-10-CM | POA: Diagnosis not present

## 2019-01-02 ENCOUNTER — Other Ambulatory Visit: Payer: Self-pay | Admitting: Physical Medicine and Rehabilitation

## 2019-01-02 DIAGNOSIS — M5416 Radiculopathy, lumbar region: Secondary | ICD-10-CM | POA: Diagnosis not present

## 2019-01-02 DIAGNOSIS — M5136 Other intervertebral disc degeneration, lumbar region: Secondary | ICD-10-CM | POA: Diagnosis not present

## 2019-01-02 DIAGNOSIS — M62830 Muscle spasm of back: Secondary | ICD-10-CM | POA: Diagnosis not present

## 2019-01-05 DIAGNOSIS — M5136 Other intervertebral disc degeneration, lumbar region: Secondary | ICD-10-CM | POA: Diagnosis not present

## 2019-01-05 DIAGNOSIS — M5416 Radiculopathy, lumbar region: Secondary | ICD-10-CM | POA: Diagnosis not present

## 2019-01-14 ENCOUNTER — Ambulatory Visit
Admission: RE | Admit: 2019-01-14 | Discharge: 2019-01-14 | Disposition: A | Discharge status: Home / Self Care | Payer: PPO | Source: Ambulatory Visit | Attending: Physical Medicine and Rehabilitation | Admitting: Physical Medicine and Rehabilitation

## 2019-01-14 ENCOUNTER — Other Ambulatory Visit: Payer: Self-pay

## 2019-01-14 DIAGNOSIS — M545 Low back pain: Secondary | ICD-10-CM | POA: Diagnosis not present

## 2019-01-14 DIAGNOSIS — M5416 Radiculopathy, lumbar region: Secondary | ICD-10-CM

## 2019-01-16 DIAGNOSIS — N760 Acute vaginitis: Secondary | ICD-10-CM | POA: Diagnosis not present

## 2019-01-16 DIAGNOSIS — Z87440 Personal history of urinary (tract) infections: Secondary | ICD-10-CM | POA: Diagnosis not present

## 2019-01-16 DIAGNOSIS — R300 Dysuria: Secondary | ICD-10-CM | POA: Diagnosis not present

## 2019-01-20 DIAGNOSIS — M5136 Other intervertebral disc degeneration, lumbar region: Secondary | ICD-10-CM | POA: Diagnosis not present

## 2019-01-20 DIAGNOSIS — M62830 Muscle spasm of back: Secondary | ICD-10-CM | POA: Diagnosis not present

## 2019-01-20 DIAGNOSIS — M5416 Radiculopathy, lumbar region: Secondary | ICD-10-CM | POA: Diagnosis not present

## 2019-01-20 DIAGNOSIS — M5126 Other intervertebral disc displacement, lumbar region: Secondary | ICD-10-CM | POA: Diagnosis not present

## 2019-02-10 DIAGNOSIS — I10 Essential (primary) hypertension: Secondary | ICD-10-CM | POA: Diagnosis not present

## 2019-02-22 DIAGNOSIS — I10 Essential (primary) hypertension: Secondary | ICD-10-CM | POA: Diagnosis not present

## 2019-03-01 ENCOUNTER — Encounter: Payer: Self-pay | Admitting: *Deleted

## 2019-03-01 ENCOUNTER — Other Ambulatory Visit: Payer: Self-pay

## 2019-03-01 ENCOUNTER — Emergency Department
Admission: EM | Admit: 2019-03-01 | Discharge: 2019-03-01 | Disposition: A | Discharge status: Home / Self Care | Payer: PPO | Attending: Emergency Medicine | Admitting: Emergency Medicine

## 2019-03-01 DIAGNOSIS — R55 Syncope and collapse: Secondary | ICD-10-CM | POA: Insufficient documentation

## 2019-03-01 DIAGNOSIS — Z5321 Procedure and treatment not carried out due to patient leaving prior to being seen by health care provider: Secondary | ICD-10-CM | POA: Insufficient documentation

## 2019-03-01 DIAGNOSIS — R000 Tachycardia, unspecified: Secondary | ICD-10-CM | POA: Diagnosis not present

## 2019-03-01 DIAGNOSIS — R110 Nausea: Secondary | ICD-10-CM | POA: Diagnosis not present

## 2019-03-01 DIAGNOSIS — R4020 Unspecified coma: Secondary | ICD-10-CM | POA: Diagnosis not present

## 2019-03-01 DIAGNOSIS — R42 Dizziness and giddiness: Secondary | ICD-10-CM | POA: Diagnosis not present

## 2019-03-01 DIAGNOSIS — I447 Left bundle-branch block, unspecified: Secondary | ICD-10-CM | POA: Diagnosis not present

## 2019-03-01 LAB — COMPREHENSIVE METABOLIC PANEL
ALT: 22 U/L (ref 0–44)
AST: 32 U/L (ref 15–41)
Albumin: 4.3 g/dL (ref 3.5–5.0)
Alkaline Phosphatase: 77 U/L (ref 38–126)
Anion gap: 11 (ref 5–15)
BUN: 15 mg/dL (ref 8–23)
CO2: 23 mmol/L (ref 22–32)
Calcium: 8.6 mg/dL — ABNORMAL LOW (ref 8.9–10.3)
Chloride: 103 mmol/L (ref 98–111)
Creatinine, Ser: 1.1 mg/dL — ABNORMAL HIGH (ref 0.44–1.00)
GFR calc Af Amer: 56 mL/min — ABNORMAL LOW (ref >60)
GFR calc non Af Amer: 48 mL/min — ABNORMAL LOW (ref >60)
Glucose, Bld: 118 mg/dL — ABNORMAL HIGH (ref 70–99)
Potassium: 3.5 mmol/L (ref 3.5–5.1)
Sodium: 137 mmol/L (ref 135–145)
Total Bilirubin: 0.4 mg/dL (ref 0.3–1.2)
Total Protein: 6.8 g/dL (ref 6.5–8.1)

## 2019-03-01 LAB — URINALYSIS, COMPLETE (UACMP) WITH MICROSCOPIC
Bacteria, UA: NONE SEEN
Bilirubin Urine: NEGATIVE
Glucose, UA: NEGATIVE mg/dL
Hgb urine dipstick: NEGATIVE
Ketones, ur: NEGATIVE mg/dL
Leukocytes,Ua: NEGATIVE
Nitrite: NEGATIVE
Protein, ur: NEGATIVE mg/dL
Specific Gravity, Urine: 1.009 (ref 1.005–1.030)
Squamous Epithelial / LPF: NONE SEEN (ref 0–5)
pH: 5 (ref 5.0–8.0)

## 2019-03-01 LAB — CBC WITH DIFFERENTIAL/PLATELET
Abs Immature Granulocytes: 0.03 10*3/uL (ref 0.00–0.07)
Basophils Absolute: 0 10*3/uL (ref 0.0–0.1)
Basophils Relative: 1 %
Eosinophils Absolute: 0.2 10*3/uL (ref 0.0–0.5)
Eosinophils Relative: 2 %
HCT: 38.5 % (ref 36.0–46.0)
Hemoglobin: 13 g/dL (ref 12.0–15.0)
Immature Granulocytes: 0 %
Lymphocytes Relative: 17 %
Lymphs Abs: 1.5 10*3/uL (ref 0.7–4.0)
MCH: 32.1 pg (ref 26.0–34.0)
MCHC: 33.8 g/dL (ref 30.0–36.0)
MCV: 95.1 fL (ref 80.0–100.0)
Monocytes Absolute: 0.7 10*3/uL (ref 0.1–1.0)
Monocytes Relative: 8 %
Neutro Abs: 6.2 10*3/uL (ref 1.7–7.7)
Neutrophils Relative %: 72 %
Platelets: 323 10*3/uL (ref 150–400)
RBC: 4.05 MIL/uL (ref 3.87–5.11)
RDW: 12.2 % (ref 11.5–15.5)
WBC: 8.6 10*3/uL (ref 4.0–10.5)
nRBC: 0 % (ref 0.0–0.2)

## 2019-03-01 LAB — TROPONIN I (HIGH SENSITIVITY): Troponin I (High Sensitivity): 10 ng/L (ref <18)

## 2019-03-01 NOTE — ED Notes (Signed)
Pt sitting in lobby with no distress noted; pt denies c/o at present and st she has her family member coming to pick her up; pt encouraged to stay & be evaluated further but declines; pt taken to triage to recheck vs and remove SL at request; instr to return for any new or worsening sympptoms

## 2019-03-01 NOTE — ED Triage Notes (Addendum)
Pt brought in via ems from church.  Pt had a syncopal episode while sitting in the pew.  Had nausea.  Intermittent dizziness.    No chest pain or sob. Hx uti.  Pt alert  Speech clear.  Iv in place.

## 2019-03-02 ENCOUNTER — Telehealth: Payer: Self-pay | Admitting: Emergency Medicine

## 2019-03-02 DIAGNOSIS — I10 Essential (primary) hypertension: Secondary | ICD-10-CM | POA: Diagnosis not present

## 2019-03-02 DIAGNOSIS — R55 Syncope and collapse: Secondary | ICD-10-CM | POA: Diagnosis not present

## 2019-03-02 NOTE — Telephone Encounter (Addendum)
Called patient due to lwot to inquire about condition and follow up plans. Number does not work.  Patient called me back . Says she went to her doctor today and has some more tests ordered.

## 2019-03-03 DIAGNOSIS — R55 Syncope and collapse: Secondary | ICD-10-CM | POA: Diagnosis not present

## 2019-03-07 DIAGNOSIS — R202 Paresthesia of skin: Secondary | ICD-10-CM | POA: Diagnosis not present

## 2019-03-07 DIAGNOSIS — F418 Other specified anxiety disorders: Secondary | ICD-10-CM | POA: Insufficient documentation

## 2019-03-07 DIAGNOSIS — H8110 Benign paroxysmal vertigo, unspecified ear: Secondary | ICD-10-CM | POA: Diagnosis not present

## 2019-03-07 DIAGNOSIS — R079 Chest pain, unspecified: Secondary | ICD-10-CM | POA: Diagnosis not present

## 2019-03-07 DIAGNOSIS — G479 Sleep disorder, unspecified: Secondary | ICD-10-CM | POA: Diagnosis not present

## 2019-03-07 DIAGNOSIS — M797 Fibromyalgia: Secondary | ICD-10-CM | POA: Diagnosis not present

## 2019-03-07 DIAGNOSIS — E7800 Pure hypercholesterolemia, unspecified: Secondary | ICD-10-CM | POA: Diagnosis not present

## 2019-03-07 DIAGNOSIS — I10 Essential (primary) hypertension: Secondary | ICD-10-CM | POA: Diagnosis not present

## 2019-03-07 DIAGNOSIS — R4589 Other symptoms and signs involving emotional state: Secondary | ICD-10-CM | POA: Insufficient documentation

## 2019-03-07 DIAGNOSIS — R200 Anesthesia of skin: Secondary | ICD-10-CM | POA: Diagnosis not present

## 2019-03-07 DIAGNOSIS — R42 Dizziness and giddiness: Secondary | ICD-10-CM | POA: Diagnosis not present

## 2019-03-07 DIAGNOSIS — R55 Syncope and collapse: Secondary | ICD-10-CM | POA: Diagnosis not present

## 2019-03-20 DIAGNOSIS — R55 Syncope and collapse: Secondary | ICD-10-CM | POA: Diagnosis not present

## 2019-03-20 DIAGNOSIS — I6523 Occlusion and stenosis of bilateral carotid arteries: Secondary | ICD-10-CM | POA: Diagnosis not present

## 2019-03-21 DIAGNOSIS — R55 Syncope and collapse: Secondary | ICD-10-CM | POA: Diagnosis not present

## 2019-03-23 DIAGNOSIS — C50111 Malignant neoplasm of central portion of right female breast: Secondary | ICD-10-CM | POA: Diagnosis not present

## 2019-03-23 DIAGNOSIS — Z4431 Encounter for fitting and adjustment of external right breast prosthesis: Secondary | ICD-10-CM | POA: Diagnosis not present

## 2019-03-27 DIAGNOSIS — R079 Chest pain, unspecified: Secondary | ICD-10-CM | POA: Diagnosis not present

## 2019-04-06 DIAGNOSIS — Z23 Encounter for immunization: Secondary | ICD-10-CM | POA: Diagnosis not present

## 2019-04-06 DIAGNOSIS — E7800 Pure hypercholesterolemia, unspecified: Secondary | ICD-10-CM | POA: Diagnosis not present

## 2019-04-06 DIAGNOSIS — I10 Essential (primary) hypertension: Secondary | ICD-10-CM | POA: Diagnosis not present

## 2019-04-06 DIAGNOSIS — R42 Dizziness and giddiness: Secondary | ICD-10-CM | POA: Diagnosis not present

## 2019-04-06 DIAGNOSIS — R55 Syncope and collapse: Secondary | ICD-10-CM | POA: Diagnosis not present

## 2019-04-06 DIAGNOSIS — M797 Fibromyalgia: Secondary | ICD-10-CM | POA: Diagnosis not present

## 2019-04-12 DIAGNOSIS — R7303 Prediabetes: Secondary | ICD-10-CM | POA: Diagnosis not present

## 2019-04-12 DIAGNOSIS — I10 Essential (primary) hypertension: Secondary | ICD-10-CM | POA: Diagnosis not present

## 2019-04-19 DIAGNOSIS — E7800 Pure hypercholesterolemia, unspecified: Secondary | ICD-10-CM | POA: Diagnosis not present

## 2019-04-19 DIAGNOSIS — M545 Low back pain: Secondary | ICD-10-CM | POA: Diagnosis not present

## 2019-04-19 DIAGNOSIS — Z0000 Encounter for general adult medical examination without abnormal findings: Secondary | ICD-10-CM | POA: Diagnosis not present

## 2019-04-19 DIAGNOSIS — H8110 Benign paroxysmal vertigo, unspecified ear: Secondary | ICD-10-CM | POA: Diagnosis not present

## 2019-04-19 DIAGNOSIS — R7303 Prediabetes: Secondary | ICD-10-CM | POA: Diagnosis not present

## 2019-04-19 DIAGNOSIS — I10 Essential (primary) hypertension: Secondary | ICD-10-CM | POA: Diagnosis not present

## 2019-04-19 DIAGNOSIS — R42 Dizziness and giddiness: Secondary | ICD-10-CM | POA: Diagnosis not present

## 2019-04-19 DIAGNOSIS — K219 Gastro-esophageal reflux disease without esophagitis: Secondary | ICD-10-CM | POA: Diagnosis not present

## 2019-04-26 DIAGNOSIS — H8111 Benign paroxysmal vertigo, right ear: Secondary | ICD-10-CM | POA: Diagnosis not present

## 2019-05-18 DIAGNOSIS — M5416 Radiculopathy, lumbar region: Secondary | ICD-10-CM | POA: Diagnosis not present

## 2019-05-18 DIAGNOSIS — M5136 Other intervertebral disc degeneration, lumbar region: Secondary | ICD-10-CM | POA: Diagnosis not present

## 2019-05-26 DIAGNOSIS — M5136 Other intervertebral disc degeneration, lumbar region: Secondary | ICD-10-CM | POA: Diagnosis not present

## 2019-05-26 DIAGNOSIS — M62830 Muscle spasm of back: Secondary | ICD-10-CM | POA: Diagnosis not present

## 2019-05-26 DIAGNOSIS — M5416 Radiculopathy, lumbar region: Secondary | ICD-10-CM | POA: Diagnosis not present

## 2019-05-26 DIAGNOSIS — M5126 Other intervertebral disc displacement, lumbar region: Secondary | ICD-10-CM | POA: Diagnosis not present

## 2019-06-15 DIAGNOSIS — Z4431 Encounter for fitting and adjustment of external right breast prosthesis: Secondary | ICD-10-CM | POA: Diagnosis not present

## 2019-06-15 DIAGNOSIS — C50111 Malignant neoplasm of central portion of right female breast: Secondary | ICD-10-CM | POA: Diagnosis not present

## 2019-06-28 DIAGNOSIS — M5136 Other intervertebral disc degeneration, lumbar region: Secondary | ICD-10-CM | POA: Diagnosis not present

## 2019-06-28 DIAGNOSIS — M62830 Muscle spasm of back: Secondary | ICD-10-CM | POA: Diagnosis not present

## 2019-06-28 DIAGNOSIS — M5416 Radiculopathy, lumbar region: Secondary | ICD-10-CM | POA: Diagnosis not present

## 2019-06-28 DIAGNOSIS — M5126 Other intervertebral disc displacement, lumbar region: Secondary | ICD-10-CM | POA: Diagnosis not present

## 2019-07-11 DIAGNOSIS — E7800 Pure hypercholesterolemia, unspecified: Secondary | ICD-10-CM | POA: Diagnosis not present

## 2019-07-11 DIAGNOSIS — F418 Other specified anxiety disorders: Secondary | ICD-10-CM | POA: Diagnosis not present

## 2019-07-11 DIAGNOSIS — G479 Sleep disorder, unspecified: Secondary | ICD-10-CM | POA: Diagnosis not present

## 2019-07-11 DIAGNOSIS — I10 Essential (primary) hypertension: Secondary | ICD-10-CM | POA: Diagnosis not present

## 2019-07-11 DIAGNOSIS — M797 Fibromyalgia: Secondary | ICD-10-CM | POA: Diagnosis not present

## 2019-07-18 DIAGNOSIS — R399 Unspecified symptoms and signs involving the genitourinary system: Secondary | ICD-10-CM | POA: Diagnosis not present

## 2019-08-02 DIAGNOSIS — M5136 Other intervertebral disc degeneration, lumbar region: Secondary | ICD-10-CM | POA: Diagnosis not present

## 2019-08-02 DIAGNOSIS — M5126 Other intervertebral disc displacement, lumbar region: Secondary | ICD-10-CM | POA: Diagnosis not present

## 2019-08-02 DIAGNOSIS — M62830 Muscle spasm of back: Secondary | ICD-10-CM | POA: Diagnosis not present

## 2019-08-02 DIAGNOSIS — M5416 Radiculopathy, lumbar region: Secondary | ICD-10-CM | POA: Diagnosis not present

## 2019-08-15 DIAGNOSIS — M545 Low back pain: Secondary | ICD-10-CM | POA: Diagnosis not present

## 2019-08-17 DIAGNOSIS — M47816 Spondylosis without myelopathy or radiculopathy, lumbar region: Secondary | ICD-10-CM | POA: Diagnosis not present

## 2019-08-17 DIAGNOSIS — M5416 Radiculopathy, lumbar region: Secondary | ICD-10-CM | POA: Diagnosis not present

## 2019-08-17 DIAGNOSIS — M545 Low back pain: Secondary | ICD-10-CM | POA: Diagnosis not present

## 2019-08-22 DIAGNOSIS — M545 Low back pain: Secondary | ICD-10-CM | POA: Diagnosis not present

## 2019-08-25 DIAGNOSIS — M545 Low back pain: Secondary | ICD-10-CM | POA: Diagnosis not present

## 2019-08-28 DIAGNOSIS — M545 Low back pain: Secondary | ICD-10-CM | POA: Diagnosis not present

## 2019-08-29 DIAGNOSIS — R42 Dizziness and giddiness: Secondary | ICD-10-CM | POA: Diagnosis not present

## 2019-08-29 DIAGNOSIS — H60542 Acute eczematoid otitis externa, left ear: Secondary | ICD-10-CM | POA: Diagnosis not present

## 2019-09-01 DIAGNOSIS — M545 Low back pain: Secondary | ICD-10-CM | POA: Diagnosis not present

## 2019-09-05 DIAGNOSIS — R399 Unspecified symptoms and signs involving the genitourinary system: Secondary | ICD-10-CM | POA: Diagnosis not present

## 2019-09-05 DIAGNOSIS — J40 Bronchitis, not specified as acute or chronic: Secondary | ICD-10-CM | POA: Diagnosis not present

## 2019-09-06 DIAGNOSIS — M545 Low back pain: Secondary | ICD-10-CM | POA: Diagnosis not present

## 2019-09-11 DIAGNOSIS — M545 Low back pain: Secondary | ICD-10-CM | POA: Diagnosis not present

## 2019-09-15 DIAGNOSIS — M545 Low back pain: Secondary | ICD-10-CM | POA: Diagnosis not present

## 2019-09-21 DIAGNOSIS — M5416 Radiculopathy, lumbar region: Secondary | ICD-10-CM | POA: Diagnosis not present

## 2019-10-11 DIAGNOSIS — R42 Dizziness and giddiness: Secondary | ICD-10-CM | POA: Diagnosis not present

## 2019-10-11 DIAGNOSIS — R7303 Prediabetes: Secondary | ICD-10-CM | POA: Diagnosis not present

## 2019-10-11 DIAGNOSIS — E7800 Pure hypercholesterolemia, unspecified: Secondary | ICD-10-CM | POA: Diagnosis not present

## 2019-10-11 DIAGNOSIS — I10 Essential (primary) hypertension: Secondary | ICD-10-CM | POA: Diagnosis not present

## 2019-10-18 DIAGNOSIS — R413 Other amnesia: Secondary | ICD-10-CM | POA: Diagnosis not present

## 2019-10-18 DIAGNOSIS — I10 Essential (primary) hypertension: Secondary | ICD-10-CM | POA: Diagnosis not present

## 2019-10-18 DIAGNOSIS — M545 Low back pain: Secondary | ICD-10-CM | POA: Diagnosis not present

## 2019-10-18 DIAGNOSIS — Z0001 Encounter for general adult medical examination with abnormal findings: Secondary | ICD-10-CM | POA: Diagnosis not present

## 2019-10-18 DIAGNOSIS — K219 Gastro-esophageal reflux disease without esophagitis: Secondary | ICD-10-CM | POA: Diagnosis not present

## 2019-10-18 DIAGNOSIS — R1032 Left lower quadrant pain: Secondary | ICD-10-CM | POA: Diagnosis not present

## 2019-10-18 DIAGNOSIS — E7800 Pure hypercholesterolemia, unspecified: Secondary | ICD-10-CM | POA: Diagnosis not present

## 2019-10-19 ENCOUNTER — Other Ambulatory Visit: Payer: Self-pay | Admitting: Internal Medicine

## 2019-10-19 DIAGNOSIS — R1032 Left lower quadrant pain: Secondary | ICD-10-CM

## 2019-10-30 ENCOUNTER — Ambulatory Visit: Admission: RE | Admit: 2019-10-30 | Payer: PPO | Source: Ambulatory Visit

## 2019-10-30 DIAGNOSIS — R55 Syncope and collapse: Secondary | ICD-10-CM | POA: Diagnosis not present

## 2019-10-30 DIAGNOSIS — R300 Dysuria: Secondary | ICD-10-CM | POA: Diagnosis not present

## 2019-10-30 DIAGNOSIS — I10 Essential (primary) hypertension: Secondary | ICD-10-CM | POA: Diagnosis not present

## 2019-10-30 DIAGNOSIS — E7800 Pure hypercholesterolemia, unspecified: Secondary | ICD-10-CM | POA: Diagnosis not present

## 2019-11-02 DIAGNOSIS — R55 Syncope and collapse: Secondary | ICD-10-CM | POA: Diagnosis not present

## 2019-11-02 DIAGNOSIS — E7800 Pure hypercholesterolemia, unspecified: Secondary | ICD-10-CM | POA: Diagnosis not present

## 2019-11-02 DIAGNOSIS — F418 Other specified anxiety disorders: Secondary | ICD-10-CM | POA: Diagnosis not present

## 2019-11-02 DIAGNOSIS — H8110 Benign paroxysmal vertigo, unspecified ear: Secondary | ICD-10-CM | POA: Diagnosis not present

## 2019-11-02 DIAGNOSIS — I10 Essential (primary) hypertension: Secondary | ICD-10-CM | POA: Diagnosis not present

## 2019-11-20 DIAGNOSIS — R55 Syncope and collapse: Secondary | ICD-10-CM | POA: Diagnosis not present

## 2019-11-21 ENCOUNTER — Other Ambulatory Visit: Payer: Self-pay

## 2019-11-21 ENCOUNTER — Ambulatory Visit
Admission: RE | Admit: 2019-11-21 | Discharge: 2019-11-21 | Disposition: A | Discharge status: Home / Self Care | Payer: PPO | Source: Ambulatory Visit | Attending: Internal Medicine | Admitting: Internal Medicine

## 2019-11-21 DIAGNOSIS — I700 Atherosclerosis of aorta: Secondary | ICD-10-CM | POA: Diagnosis not present

## 2019-11-21 DIAGNOSIS — K8689 Other specified diseases of pancreas: Secondary | ICD-10-CM | POA: Diagnosis not present

## 2019-11-21 DIAGNOSIS — K838 Other specified diseases of biliary tract: Secondary | ICD-10-CM | POA: Diagnosis not present

## 2019-11-21 DIAGNOSIS — R1032 Left lower quadrant pain: Secondary | ICD-10-CM | POA: Diagnosis not present

## 2019-11-21 DIAGNOSIS — K449 Diaphragmatic hernia without obstruction or gangrene: Secondary | ICD-10-CM | POA: Diagnosis not present

## 2019-11-21 LAB — POCT I-STAT CREATININE: Creatinine, Ser: 1 mg/dL (ref 0.44–1.00)

## 2019-11-21 MED ORDER — IOHEXOL 300 MG/ML  SOLN
75.0000 mL | Freq: Once | INTRAMUSCULAR | Status: AC | PRN
  Administered 2019-11-21: 75 mL via INTRAVENOUS

## 2019-11-23 DIAGNOSIS — K8689 Other specified diseases of pancreas: Secondary | ICD-10-CM | POA: Diagnosis not present

## 2019-11-23 IMAGING — MG MM DIGITAL SCREENING UNILAT*L* W/ TOMO W/ CAD
4 series · 4 of 12 positions shown · non-contrast
Comparison: Previous exam(s).

CLINICAL DATA: Screening.

EXAM:
DIGITAL SCREENING UNILATERAL LEFT MAMMOGRAM WITH CAD AND TOMO

[L MLO synth-2D]
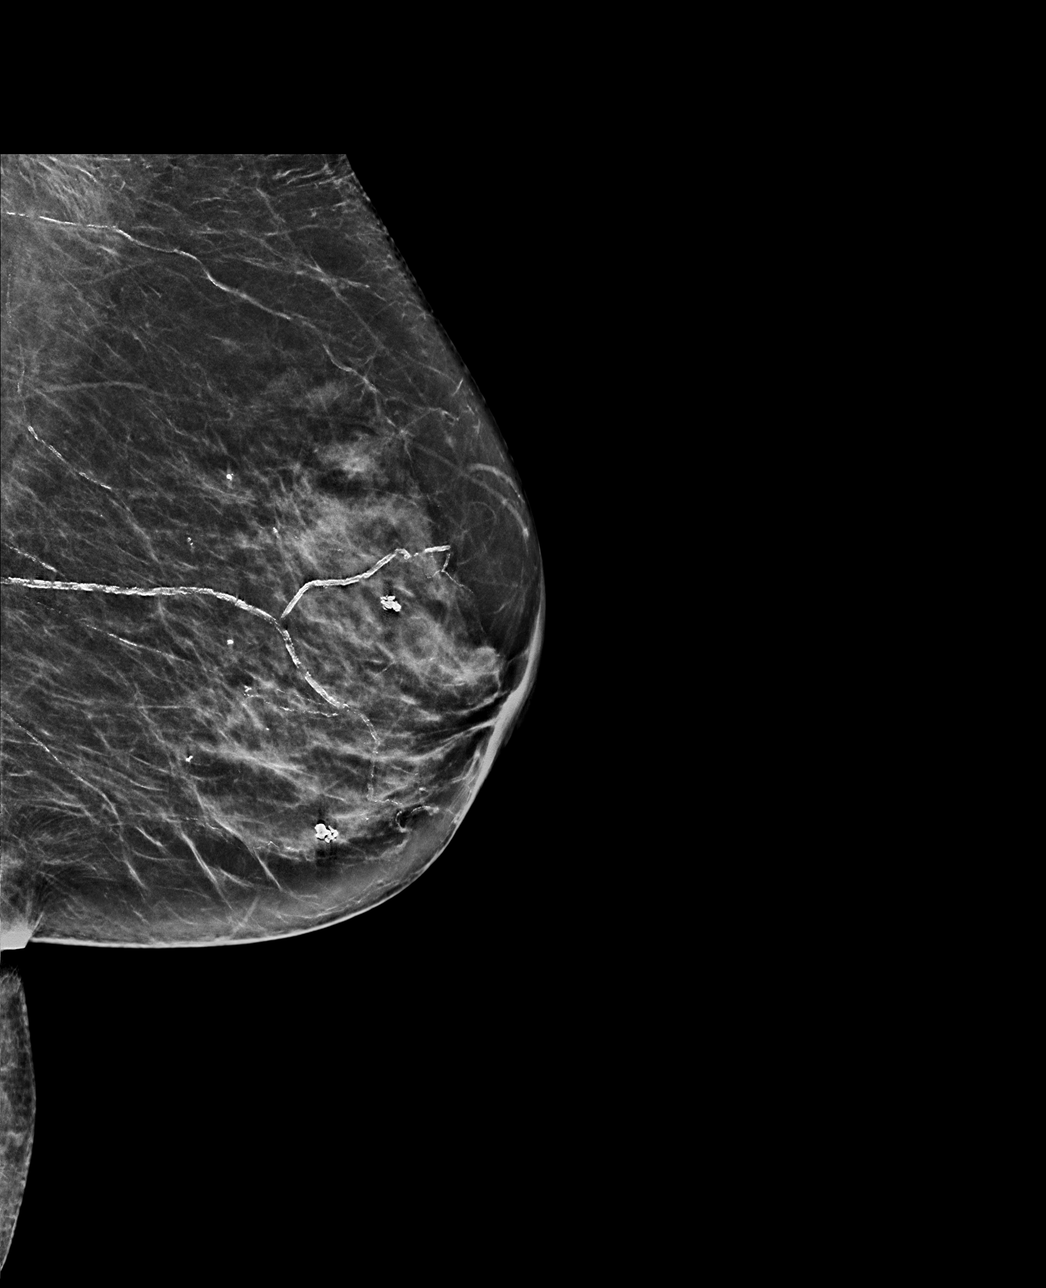

[L CC synth-2D]
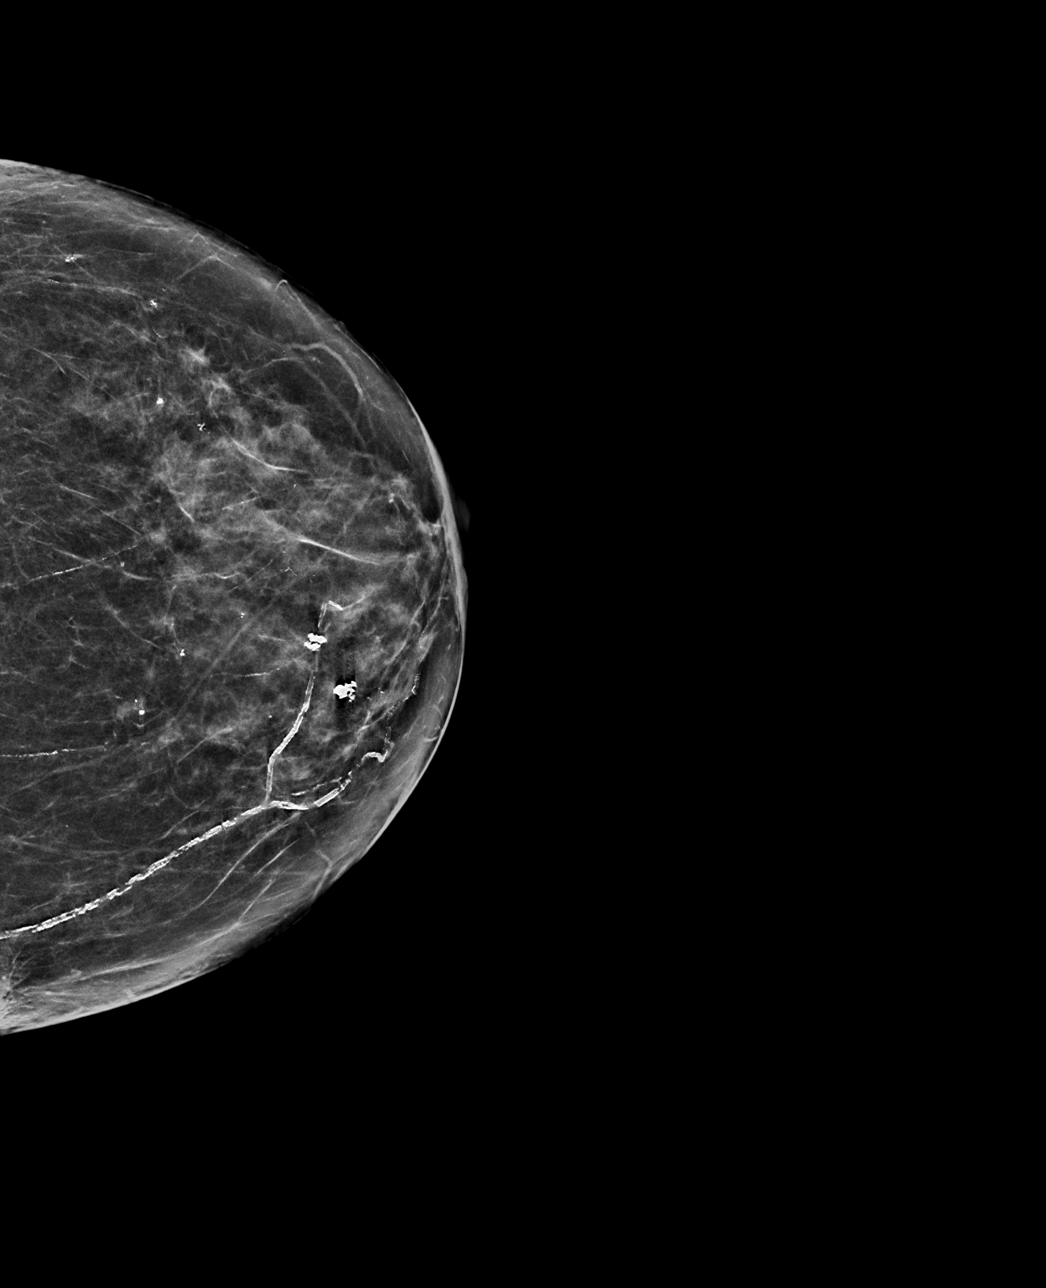

[L CC tomo · tomo slice 31/62.0]
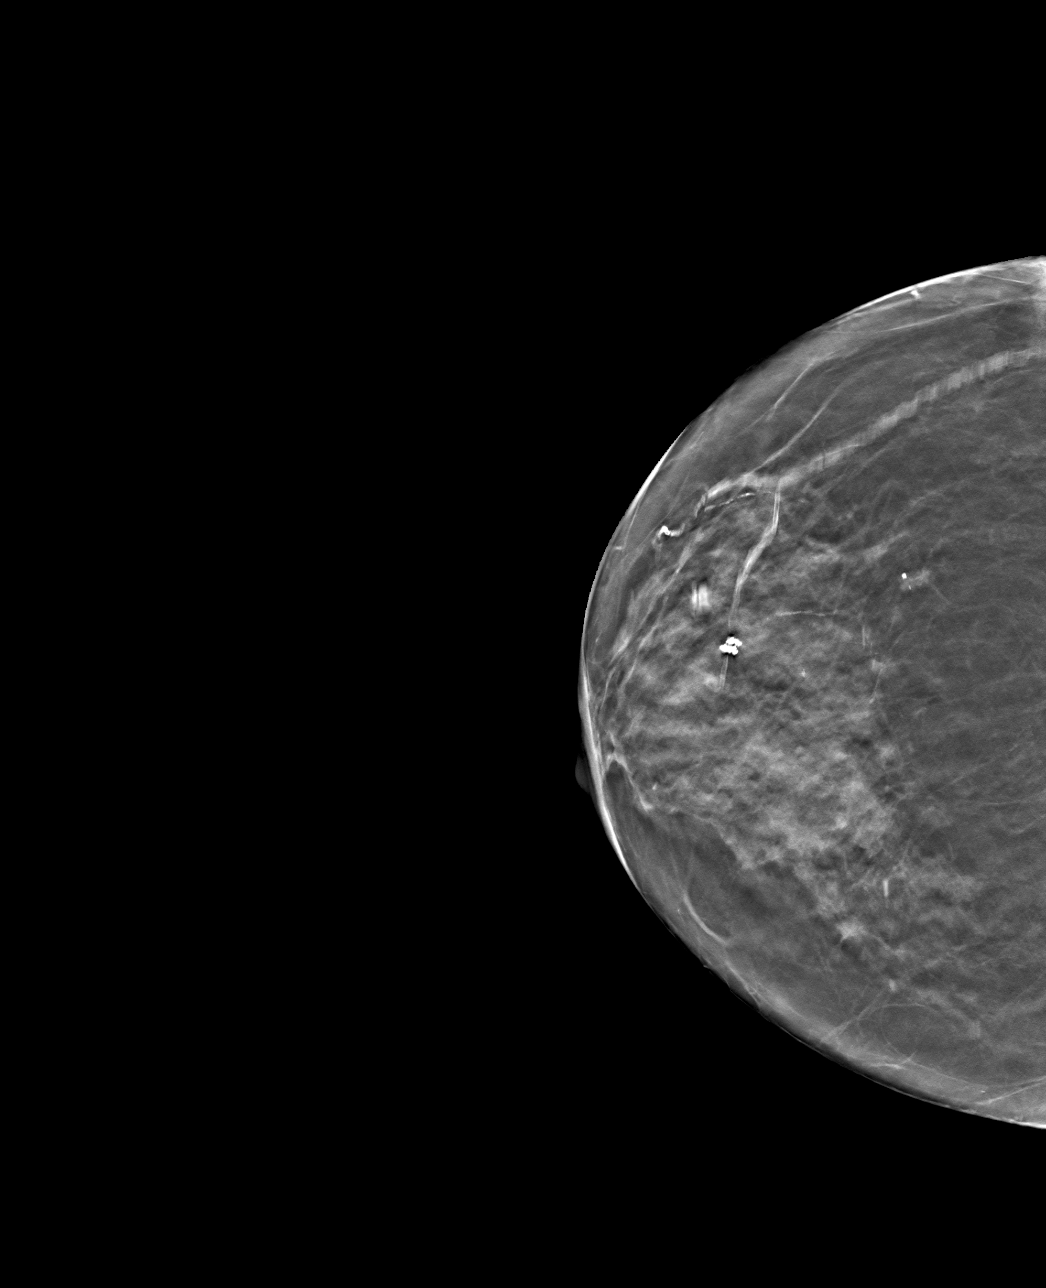

[L MLO tomo · tomo slice 33/65.0]
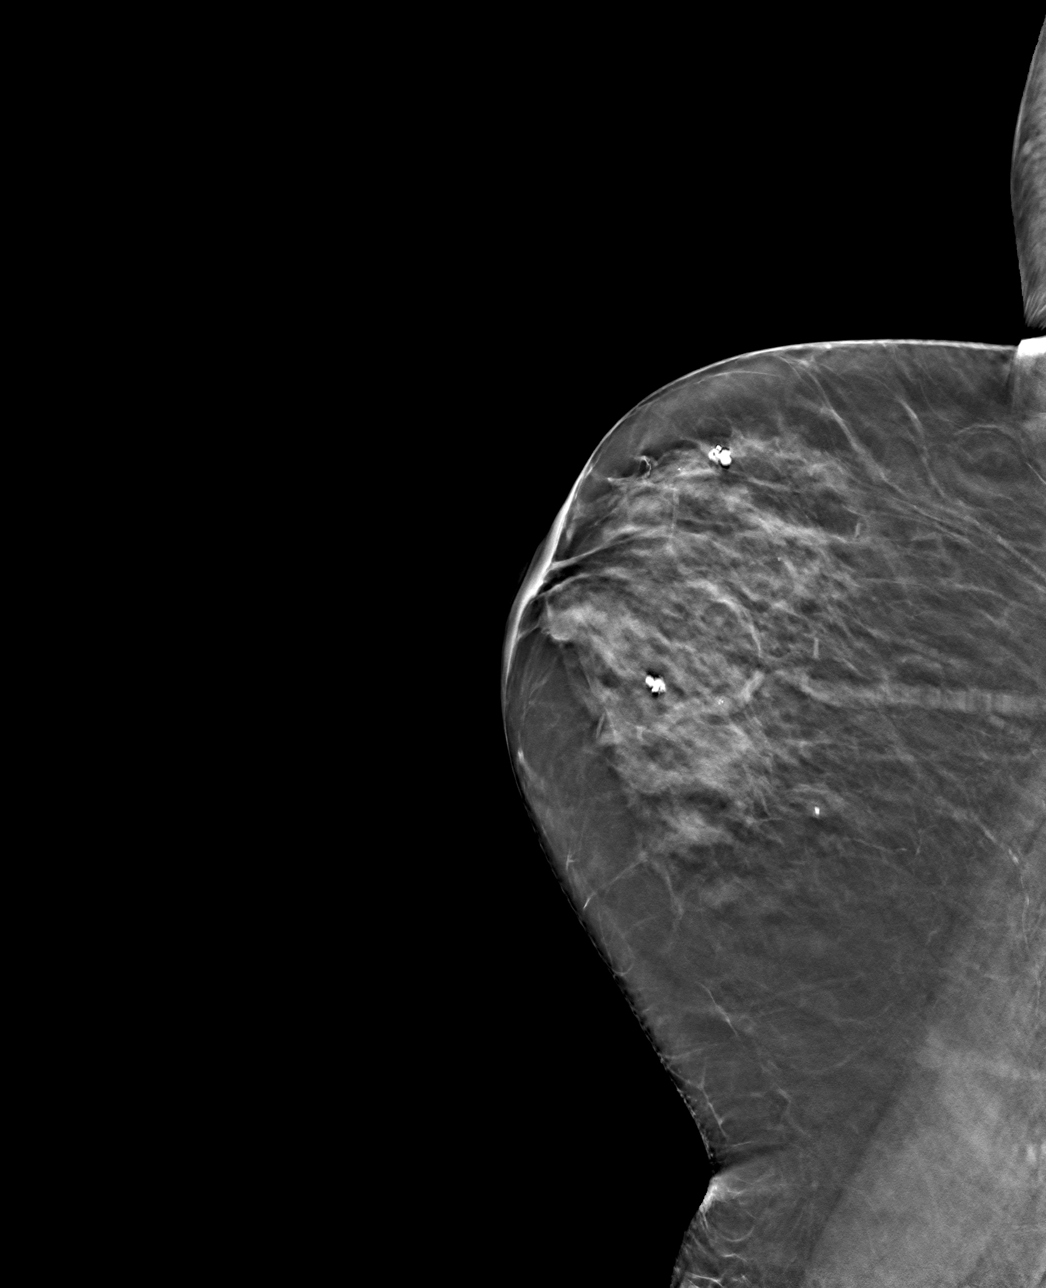

[4 of 12 positions shown; findings below may reference images not displayed]

ACR Breast Density Category c: The breast tissue is heterogeneously
dense, which may obscure small masses.
FINDINGS: There are no findings suspicious for malignancy. Images were
processed with CAD.
IMPRESSION: No mammographic evidence of malignancy. A result letter of this
screening mammogram will be mailed directly to the patient.

RECOMMENDATION:
Screening mammogram in one year. (Code:YR-2-EWS)

BI-RADS CATEGORY  1: Negative.

## 2019-11-30 DIAGNOSIS — G479 Sleep disorder, unspecified: Secondary | ICD-10-CM | POA: Diagnosis not present

## 2019-11-30 DIAGNOSIS — E7800 Pure hypercholesterolemia, unspecified: Secondary | ICD-10-CM | POA: Diagnosis not present

## 2019-11-30 DIAGNOSIS — H8110 Benign paroxysmal vertigo, unspecified ear: Secondary | ICD-10-CM | POA: Diagnosis not present

## 2019-11-30 DIAGNOSIS — M7061 Trochanteric bursitis, right hip: Secondary | ICD-10-CM | POA: Diagnosis not present

## 2019-11-30 DIAGNOSIS — I10 Essential (primary) hypertension: Secondary | ICD-10-CM | POA: Diagnosis not present

## 2019-11-30 DIAGNOSIS — R55 Syncope and collapse: Secondary | ICD-10-CM | POA: Diagnosis not present

## 2019-11-30 DIAGNOSIS — R202 Paresthesia of skin: Secondary | ICD-10-CM | POA: Diagnosis not present

## 2019-11-30 DIAGNOSIS — R200 Anesthesia of skin: Secondary | ICD-10-CM | POA: Diagnosis not present

## 2019-11-30 DIAGNOSIS — F418 Other specified anxiety disorders: Secondary | ICD-10-CM | POA: Diagnosis not present

## 2019-11-30 DIAGNOSIS — M797 Fibromyalgia: Secondary | ICD-10-CM | POA: Diagnosis not present

## 2019-12-04 DIAGNOSIS — K8689 Other specified diseases of pancreas: Secondary | ICD-10-CM | POA: Diagnosis not present

## 2019-12-06 DIAGNOSIS — R932 Abnormal findings on diagnostic imaging of liver and biliary tract: Secondary | ICD-10-CM | POA: Diagnosis not present

## 2019-12-06 DIAGNOSIS — K8689 Other specified diseases of pancreas: Secondary | ICD-10-CM | POA: Diagnosis not present

## 2019-12-06 DIAGNOSIS — I10 Essential (primary) hypertension: Secondary | ICD-10-CM | POA: Diagnosis not present

## 2019-12-06 DIAGNOSIS — C250 Malignant neoplasm of head of pancreas: Secondary | ICD-10-CM | POA: Diagnosis not present

## 2019-12-06 DIAGNOSIS — Z7982 Long term (current) use of aspirin: Secondary | ICD-10-CM | POA: Diagnosis not present

## 2019-12-06 DIAGNOSIS — R933 Abnormal findings on diagnostic imaging of other parts of digestive tract: Secondary | ICD-10-CM | POA: Diagnosis not present

## 2019-12-06 DIAGNOSIS — Z9011 Acquired absence of right breast and nipple: Secondary | ICD-10-CM | POA: Diagnosis not present

## 2019-12-06 DIAGNOSIS — K449 Diaphragmatic hernia without obstruction or gangrene: Secondary | ICD-10-CM | POA: Diagnosis not present

## 2019-12-06 DIAGNOSIS — Z9049 Acquired absence of other specified parts of digestive tract: Secondary | ICD-10-CM | POA: Diagnosis not present

## 2019-12-06 DIAGNOSIS — Z791 Long term (current) use of non-steroidal anti-inflammatories (NSAID): Secondary | ICD-10-CM | POA: Diagnosis not present

## 2019-12-06 DIAGNOSIS — Z79899 Other long term (current) drug therapy: Secondary | ICD-10-CM | POA: Diagnosis not present

## 2019-12-06 DIAGNOSIS — Z853 Personal history of malignant neoplasm of breast: Secondary | ICD-10-CM | POA: Diagnosis not present

## 2019-12-06 DIAGNOSIS — E785 Hyperlipidemia, unspecified: Secondary | ICD-10-CM | POA: Diagnosis not present

## 2019-12-06 DIAGNOSIS — K317 Polyp of stomach and duodenum: Secondary | ICD-10-CM | POA: Diagnosis not present

## 2019-12-06 DIAGNOSIS — Z9221 Personal history of antineoplastic chemotherapy: Secondary | ICD-10-CM | POA: Diagnosis not present

## 2019-12-06 DIAGNOSIS — Z86010 Personal history of colonic polyps: Secondary | ICD-10-CM | POA: Diagnosis not present

## 2019-12-06 DIAGNOSIS — K831 Obstruction of bile duct: Secondary | ICD-10-CM | POA: Diagnosis not present

## 2019-12-08 DIAGNOSIS — C250 Malignant neoplasm of head of pancreas: Secondary | ICD-10-CM | POA: Insufficient documentation

## 2019-12-12 DIAGNOSIS — C250 Malignant neoplasm of head of pancreas: Secondary | ICD-10-CM | POA: Diagnosis not present

## 2019-12-12 DIAGNOSIS — R918 Other nonspecific abnormal finding of lung field: Secondary | ICD-10-CM | POA: Diagnosis not present

## 2019-12-14 DIAGNOSIS — H10233 Serous conjunctivitis, except viral, bilateral: Secondary | ICD-10-CM | POA: Diagnosis not present

## 2019-12-14 DIAGNOSIS — R55 Syncope and collapse: Secondary | ICD-10-CM | POA: Diagnosis not present

## 2019-12-14 DIAGNOSIS — H04123 Dry eye syndrome of bilateral lacrimal glands: Secondary | ICD-10-CM | POA: Diagnosis not present

## 2019-12-14 DIAGNOSIS — Z961 Presence of intraocular lens: Secondary | ICD-10-CM | POA: Diagnosis not present

## 2019-12-14 DIAGNOSIS — M797 Fibromyalgia: Secondary | ICD-10-CM | POA: Diagnosis not present

## 2019-12-14 DIAGNOSIS — F418 Other specified anxiety disorders: Secondary | ICD-10-CM | POA: Diagnosis not present

## 2019-12-14 DIAGNOSIS — I10 Essential (primary) hypertension: Secondary | ICD-10-CM | POA: Diagnosis not present

## 2019-12-14 DIAGNOSIS — E7800 Pure hypercholesterolemia, unspecified: Secondary | ICD-10-CM | POA: Diagnosis not present

## 2019-12-15 DIAGNOSIS — Z01818 Encounter for other preprocedural examination: Secondary | ICD-10-CM | POA: Diagnosis not present

## 2019-12-15 DIAGNOSIS — F5104 Psychophysiologic insomnia: Secondary | ICD-10-CM | POA: Diagnosis not present

## 2019-12-15 DIAGNOSIS — M797 Fibromyalgia: Secondary | ICD-10-CM | POA: Diagnosis not present

## 2019-12-15 DIAGNOSIS — C250 Malignant neoplasm of head of pancreas: Secondary | ICD-10-CM | POA: Diagnosis not present

## 2019-12-15 DIAGNOSIS — F418 Other specified anxiety disorders: Secondary | ICD-10-CM | POA: Diagnosis not present

## 2019-12-15 DIAGNOSIS — Z79899 Other long term (current) drug therapy: Secondary | ICD-10-CM | POA: Diagnosis not present

## 2019-12-15 DIAGNOSIS — I10 Essential (primary) hypertension: Secondary | ICD-10-CM | POA: Diagnosis not present

## 2019-12-15 DIAGNOSIS — Z1331 Encounter for screening for depression: Secondary | ICD-10-CM | POA: Diagnosis not present

## 2019-12-15 DIAGNOSIS — Z8669 Personal history of other diseases of the nervous system and sense organs: Secondary | ICD-10-CM | POA: Diagnosis not present

## 2019-12-15 DIAGNOSIS — R32 Unspecified urinary incontinence: Secondary | ICD-10-CM | POA: Diagnosis not present

## 2019-12-15 DIAGNOSIS — C259 Malignant neoplasm of pancreas, unspecified: Secondary | ICD-10-CM | POA: Diagnosis not present

## 2019-12-15 DIAGNOSIS — C50911 Malignant neoplasm of unspecified site of right female breast: Secondary | ICD-10-CM | POA: Diagnosis not present

## 2019-12-15 DIAGNOSIS — R55 Syncope and collapse: Secondary | ICD-10-CM | POA: Diagnosis not present

## 2019-12-15 DIAGNOSIS — K219 Gastro-esophageal reflux disease without esophagitis: Secondary | ICD-10-CM | POA: Diagnosis not present

## 2019-12-20 DIAGNOSIS — M5416 Radiculopathy, lumbar region: Secondary | ICD-10-CM | POA: Diagnosis not present

## 2019-12-22 DIAGNOSIS — M5416 Radiculopathy, lumbar region: Secondary | ICD-10-CM | POA: Diagnosis not present

## 2019-12-26 DIAGNOSIS — M5416 Radiculopathy, lumbar region: Secondary | ICD-10-CM | POA: Diagnosis not present

## 2019-12-28 DIAGNOSIS — K8689 Other specified diseases of pancreas: Secondary | ICD-10-CM | POA: Diagnosis not present

## 2019-12-28 DIAGNOSIS — R531 Weakness: Secondary | ICD-10-CM | POA: Diagnosis not present

## 2019-12-28 DIAGNOSIS — M5459 Other low back pain: Secondary | ICD-10-CM | POA: Diagnosis not present

## 2019-12-28 DIAGNOSIS — R300 Dysuria: Secondary | ICD-10-CM | POA: Diagnosis not present

## 2019-12-28 DIAGNOSIS — M545 Low back pain, unspecified: Secondary | ICD-10-CM | POA: Diagnosis not present

## 2020-01-02 DIAGNOSIS — Z4659 Encounter for fitting and adjustment of other gastrointestinal appliance and device: Secondary | ICD-10-CM | POA: Diagnosis not present

## 2020-01-02 DIAGNOSIS — Z79899 Other long term (current) drug therapy: Secondary | ICD-10-CM | POA: Diagnosis not present

## 2020-01-02 DIAGNOSIS — I80243 Phlebitis and thrombophlebitis of peroneal vein, bilateral: Secondary | ICD-10-CM | POA: Diagnosis not present

## 2020-01-02 DIAGNOSIS — Z9011 Acquired absence of right breast and nipple: Secondary | ICD-10-CM | POA: Diagnosis not present

## 2020-01-02 DIAGNOSIS — J90 Pleural effusion, not elsewhere classified: Secondary | ICD-10-CM | POA: Diagnosis not present

## 2020-01-02 DIAGNOSIS — J690 Pneumonitis due to inhalation of food and vomit: Secondary | ICD-10-CM | POA: Diagnosis not present

## 2020-01-02 DIAGNOSIS — T50905A Adverse effect of unspecified drugs, medicaments and biological substances, initial encounter: Secondary | ICD-10-CM | POA: Diagnosis not present

## 2020-01-02 DIAGNOSIS — I959 Hypotension, unspecified: Secondary | ICD-10-CM | POA: Diagnosis not present

## 2020-01-02 DIAGNOSIS — F5104 Psychophysiologic insomnia: Secondary | ICD-10-CM | POA: Diagnosis not present

## 2020-01-02 DIAGNOSIS — D649 Anemia, unspecified: Secondary | ICD-10-CM | POA: Diagnosis not present

## 2020-01-02 DIAGNOSIS — Z9049 Acquired absence of other specified parts of digestive tract: Secondary | ICD-10-CM | POA: Diagnosis not present

## 2020-01-02 DIAGNOSIS — Z853 Personal history of malignant neoplasm of breast: Secondary | ICD-10-CM | POA: Diagnosis not present

## 2020-01-02 DIAGNOSIS — R188 Other ascites: Secondary | ICD-10-CM | POA: Diagnosis not present

## 2020-01-02 DIAGNOSIS — Z7189 Other specified counseling: Secondary | ICD-10-CM | POA: Diagnosis not present

## 2020-01-02 DIAGNOSIS — Z885 Allergy status to narcotic agent status: Secondary | ICD-10-CM | POA: Diagnosis not present

## 2020-01-02 DIAGNOSIS — A419 Sepsis, unspecified organism: Secondary | ICD-10-CM | POA: Diagnosis not present

## 2020-01-02 DIAGNOSIS — G8929 Other chronic pain: Secondary | ICD-10-CM | POA: Diagnosis not present

## 2020-01-02 DIAGNOSIS — T8182XA Emphysema (subcutaneous) resulting from a procedure, initial encounter: Secondary | ICD-10-CM | POA: Diagnosis not present

## 2020-01-02 DIAGNOSIS — Z9189 Other specified personal risk factors, not elsewhere classified: Secondary | ICD-10-CM | POA: Diagnosis not present

## 2020-01-02 DIAGNOSIS — K5900 Constipation, unspecified: Secondary | ICD-10-CM | POA: Diagnosis not present

## 2020-01-02 DIAGNOSIS — I10 Essential (primary) hypertension: Secondary | ICD-10-CM | POA: Diagnosis not present

## 2020-01-02 DIAGNOSIS — Z86010 Personal history of colonic polyps: Secondary | ICD-10-CM | POA: Diagnosis not present

## 2020-01-02 DIAGNOSIS — Z791 Long term (current) use of non-steroidal anti-inflammatories (NSAID): Secondary | ICD-10-CM | POA: Diagnosis not present

## 2020-01-02 DIAGNOSIS — C259 Malignant neoplasm of pancreas, unspecified: Secondary | ICD-10-CM | POA: Diagnosis not present

## 2020-01-02 DIAGNOSIS — M62838 Other muscle spasm: Secondary | ICD-10-CM | POA: Diagnosis not present

## 2020-01-02 DIAGNOSIS — C250 Malignant neoplasm of head of pancreas: Secondary | ICD-10-CM | POA: Diagnosis not present

## 2020-01-02 DIAGNOSIS — K660 Peritoneal adhesions (postprocedural) (postinfection): Secondary | ICD-10-CM | POA: Diagnosis not present

## 2020-01-02 DIAGNOSIS — D72829 Elevated white blood cell count, unspecified: Secondary | ICD-10-CM | POA: Diagnosis not present

## 2020-01-02 DIAGNOSIS — K838 Other specified diseases of biliary tract: Secondary | ICD-10-CM | POA: Diagnosis not present

## 2020-01-02 DIAGNOSIS — E569 Vitamin deficiency, unspecified: Secondary | ICD-10-CM | POA: Diagnosis not present

## 2020-01-02 DIAGNOSIS — B9620 Unspecified Escherichia coli [E. coli] as the cause of diseases classified elsewhere: Secondary | ICD-10-CM | POA: Diagnosis not present

## 2020-01-02 DIAGNOSIS — R0681 Apnea, not elsewhere classified: Secondary | ICD-10-CM | POA: Diagnosis not present

## 2020-01-02 DIAGNOSIS — R000 Tachycardia, unspecified: Secondary | ICD-10-CM | POA: Diagnosis not present

## 2020-01-02 DIAGNOSIS — K219 Gastro-esophageal reflux disease without esophagitis: Secondary | ICD-10-CM | POA: Diagnosis not present

## 2020-01-02 DIAGNOSIS — J9811 Atelectasis: Secondary | ICD-10-CM | POA: Diagnosis not present

## 2020-01-02 DIAGNOSIS — R110 Nausea: Secondary | ICD-10-CM | POA: Diagnosis not present

## 2020-01-02 DIAGNOSIS — F419 Anxiety disorder, unspecified: Secondary | ICD-10-CM | POA: Diagnosis not present

## 2020-01-02 DIAGNOSIS — M6281 Muscle weakness (generalized): Secondary | ICD-10-CM | POA: Diagnosis not present

## 2020-01-02 DIAGNOSIS — Z7982 Long term (current) use of aspirin: Secondary | ICD-10-CM | POA: Diagnosis not present

## 2020-01-02 DIAGNOSIS — E440 Moderate protein-calorie malnutrition: Secondary | ICD-10-CM | POA: Diagnosis not present

## 2020-01-02 DIAGNOSIS — Z20822 Contact with and (suspected) exposure to covid-19: Secondary | ICD-10-CM | POA: Diagnosis not present

## 2020-01-02 DIAGNOSIS — M797 Fibromyalgia: Secondary | ICD-10-CM | POA: Diagnosis not present

## 2020-01-02 DIAGNOSIS — L298 Other pruritus: Secondary | ICD-10-CM | POA: Diagnosis not present

## 2020-01-02 DIAGNOSIS — L299 Pruritus, unspecified: Secondary | ICD-10-CM | POA: Diagnosis not present

## 2020-01-02 DIAGNOSIS — R55 Syncope and collapse: Secondary | ICD-10-CM | POA: Diagnosis not present

## 2020-01-02 DIAGNOSIS — F32A Depression, unspecified: Secondary | ICD-10-CM | POA: Diagnosis not present

## 2020-01-02 DIAGNOSIS — M545 Low back pain, unspecified: Secondary | ICD-10-CM | POA: Diagnosis not present

## 2020-01-02 DIAGNOSIS — F064 Anxiety disorder due to known physiological condition: Secondary | ICD-10-CM | POA: Diagnosis not present

## 2020-01-02 DIAGNOSIS — N390 Urinary tract infection, site not specified: Secondary | ICD-10-CM | POA: Diagnosis not present

## 2020-01-02 DIAGNOSIS — R112 Nausea with vomiting, unspecified: Secondary | ICD-10-CM | POA: Diagnosis not present

## 2020-01-02 DIAGNOSIS — K30 Functional dyspepsia: Secondary | ICD-10-CM | POA: Diagnosis not present

## 2020-01-02 DIAGNOSIS — G8918 Other acute postprocedural pain: Secondary | ICD-10-CM | POA: Diagnosis not present

## 2020-01-04 DIAGNOSIS — C250 Malignant neoplasm of head of pancreas: Secondary | ICD-10-CM | POA: Diagnosis not present

## 2020-01-04 DIAGNOSIS — G8918 Other acute postprocedural pain: Secondary | ICD-10-CM | POA: Diagnosis not present

## 2020-01-04 DIAGNOSIS — K660 Peritoneal adhesions (postprocedural) (postinfection): Secondary | ICD-10-CM | POA: Diagnosis not present

## 2020-01-04 DIAGNOSIS — C259 Malignant neoplasm of pancreas, unspecified: Secondary | ICD-10-CM | POA: Insufficient documentation

## 2020-01-04 HISTORY — PX: PANCREAS SURGERY: SHX731

## 2020-01-05 DIAGNOSIS — G8918 Other acute postprocedural pain: Secondary | ICD-10-CM | POA: Diagnosis not present

## 2020-01-06 DIAGNOSIS — G8918 Other acute postprocedural pain: Secondary | ICD-10-CM | POA: Diagnosis not present

## 2020-01-06 DIAGNOSIS — Z4659 Encounter for fitting and adjustment of other gastrointestinal appliance and device: Secondary | ICD-10-CM | POA: Diagnosis not present

## 2020-01-06 DIAGNOSIS — R110 Nausea: Secondary | ICD-10-CM | POA: Diagnosis not present

## 2020-01-07 DIAGNOSIS — G8918 Other acute postprocedural pain: Secondary | ICD-10-CM | POA: Diagnosis not present

## 2020-01-07 DIAGNOSIS — R112 Nausea with vomiting, unspecified: Secondary | ICD-10-CM | POA: Diagnosis not present

## 2020-01-08 DIAGNOSIS — Z9189 Other specified personal risk factors, not elsewhere classified: Secondary | ICD-10-CM | POA: Diagnosis not present

## 2020-01-08 DIAGNOSIS — M545 Low back pain, unspecified: Secondary | ICD-10-CM | POA: Diagnosis not present

## 2020-01-08 DIAGNOSIS — F419 Anxiety disorder, unspecified: Secondary | ICD-10-CM | POA: Diagnosis not present

## 2020-01-08 DIAGNOSIS — C250 Malignant neoplasm of head of pancreas: Secondary | ICD-10-CM | POA: Diagnosis not present

## 2020-01-08 DIAGNOSIS — Z79899 Other long term (current) drug therapy: Secondary | ICD-10-CM | POA: Diagnosis not present

## 2020-01-08 DIAGNOSIS — G8929 Other chronic pain: Secondary | ICD-10-CM | POA: Diagnosis not present

## 2020-01-08 DIAGNOSIS — G8918 Other acute postprocedural pain: Secondary | ICD-10-CM | POA: Diagnosis not present

## 2020-01-08 DIAGNOSIS — R110 Nausea: Secondary | ICD-10-CM | POA: Diagnosis not present

## 2020-01-09 DIAGNOSIS — F5104 Psychophysiologic insomnia: Secondary | ICD-10-CM | POA: Diagnosis not present

## 2020-01-09 DIAGNOSIS — Z9189 Other specified personal risk factors, not elsewhere classified: Secondary | ICD-10-CM | POA: Diagnosis not present

## 2020-01-09 DIAGNOSIS — G8929 Other chronic pain: Secondary | ICD-10-CM | POA: Diagnosis not present

## 2020-01-09 DIAGNOSIS — Z79899 Other long term (current) drug therapy: Secondary | ICD-10-CM | POA: Diagnosis not present

## 2020-01-09 DIAGNOSIS — G8918 Other acute postprocedural pain: Secondary | ICD-10-CM | POA: Diagnosis not present

## 2020-01-09 DIAGNOSIS — C250 Malignant neoplasm of head of pancreas: Secondary | ICD-10-CM | POA: Diagnosis not present

## 2020-01-09 DIAGNOSIS — F419 Anxiety disorder, unspecified: Secondary | ICD-10-CM | POA: Diagnosis not present

## 2020-01-09 DIAGNOSIS — R55 Syncope and collapse: Secondary | ICD-10-CM | POA: Diagnosis not present

## 2020-01-09 DIAGNOSIS — Z7189 Other specified counseling: Secondary | ICD-10-CM | POA: Diagnosis not present

## 2020-01-09 DIAGNOSIS — M545 Low back pain, unspecified: Secondary | ICD-10-CM | POA: Diagnosis not present

## 2020-01-09 DIAGNOSIS — R110 Nausea: Secondary | ICD-10-CM | POA: Diagnosis not present

## 2020-01-09 DIAGNOSIS — I959 Hypotension, unspecified: Secondary | ICD-10-CM | POA: Diagnosis not present

## 2020-01-10 DIAGNOSIS — C250 Malignant neoplasm of head of pancreas: Secondary | ICD-10-CM | POA: Diagnosis not present

## 2020-01-10 DIAGNOSIS — G8918 Other acute postprocedural pain: Secondary | ICD-10-CM | POA: Diagnosis not present

## 2020-01-11 DIAGNOSIS — R112 Nausea with vomiting, unspecified: Secondary | ICD-10-CM | POA: Diagnosis not present

## 2020-01-11 DIAGNOSIS — F419 Anxiety disorder, unspecified: Secondary | ICD-10-CM | POA: Diagnosis not present

## 2020-01-11 DIAGNOSIS — L299 Pruritus, unspecified: Secondary | ICD-10-CM | POA: Diagnosis not present

## 2020-01-11 DIAGNOSIS — T50905A Adverse effect of unspecified drugs, medicaments and biological substances, initial encounter: Secondary | ICD-10-CM | POA: Diagnosis not present

## 2020-01-11 DIAGNOSIS — G8918 Other acute postprocedural pain: Secondary | ICD-10-CM | POA: Diagnosis not present

## 2020-01-11 DIAGNOSIS — Z79899 Other long term (current) drug therapy: Secondary | ICD-10-CM | POA: Diagnosis not present

## 2020-01-11 DIAGNOSIS — C250 Malignant neoplasm of head of pancreas: Secondary | ICD-10-CM | POA: Diagnosis not present

## 2020-01-12 DIAGNOSIS — F419 Anxiety disorder, unspecified: Secondary | ICD-10-CM | POA: Diagnosis not present

## 2020-01-12 DIAGNOSIS — F5104 Psychophysiologic insomnia: Secondary | ICD-10-CM | POA: Diagnosis not present

## 2020-01-12 DIAGNOSIS — K30 Functional dyspepsia: Secondary | ICD-10-CM | POA: Diagnosis not present

## 2020-01-12 DIAGNOSIS — G8929 Other chronic pain: Secondary | ICD-10-CM | POA: Diagnosis not present

## 2020-01-12 DIAGNOSIS — R188 Other ascites: Secondary | ICD-10-CM | POA: Diagnosis not present

## 2020-01-12 DIAGNOSIS — Z9049 Acquired absence of other specified parts of digestive tract: Secondary | ICD-10-CM | POA: Diagnosis not present

## 2020-01-12 DIAGNOSIS — G8918 Other acute postprocedural pain: Secondary | ICD-10-CM | POA: Diagnosis not present

## 2020-01-12 DIAGNOSIS — Z79899 Other long term (current) drug therapy: Secondary | ICD-10-CM | POA: Diagnosis not present

## 2020-01-12 DIAGNOSIS — R000 Tachycardia, unspecified: Secondary | ICD-10-CM | POA: Diagnosis not present

## 2020-01-12 DIAGNOSIS — C250 Malignant neoplasm of head of pancreas: Secondary | ICD-10-CM | POA: Diagnosis not present

## 2020-01-12 DIAGNOSIS — Z7189 Other specified counseling: Secondary | ICD-10-CM | POA: Diagnosis not present

## 2020-01-12 DIAGNOSIS — R110 Nausea: Secondary | ICD-10-CM | POA: Diagnosis not present

## 2020-01-12 DIAGNOSIS — J9811 Atelectasis: Secondary | ICD-10-CM | POA: Diagnosis not present

## 2020-01-12 DIAGNOSIS — Z9189 Other specified personal risk factors, not elsewhere classified: Secondary | ICD-10-CM | POA: Diagnosis not present

## 2020-01-12 DIAGNOSIS — M545 Low back pain, unspecified: Secondary | ICD-10-CM | POA: Diagnosis not present

## 2020-01-12 DIAGNOSIS — D72829 Elevated white blood cell count, unspecified: Secondary | ICD-10-CM | POA: Diagnosis not present

## 2020-01-15 DIAGNOSIS — Z79899 Other long term (current) drug therapy: Secondary | ICD-10-CM | POA: Diagnosis not present

## 2020-01-15 DIAGNOSIS — C250 Malignant neoplasm of head of pancreas: Secondary | ICD-10-CM | POA: Diagnosis not present

## 2020-01-15 DIAGNOSIS — F419 Anxiety disorder, unspecified: Secondary | ICD-10-CM | POA: Diagnosis not present

## 2020-01-16 DIAGNOSIS — C250 Malignant neoplasm of head of pancreas: Secondary | ICD-10-CM | POA: Diagnosis not present

## 2020-01-16 DIAGNOSIS — F419 Anxiety disorder, unspecified: Secondary | ICD-10-CM | POA: Diagnosis not present

## 2020-01-16 DIAGNOSIS — R110 Nausea: Secondary | ICD-10-CM | POA: Diagnosis not present

## 2020-01-16 DIAGNOSIS — R112 Nausea with vomiting, unspecified: Secondary | ICD-10-CM | POA: Diagnosis not present

## 2020-01-16 DIAGNOSIS — Z79899 Other long term (current) drug therapy: Secondary | ICD-10-CM | POA: Diagnosis not present

## 2020-01-16 DIAGNOSIS — Z4659 Encounter for fitting and adjustment of other gastrointestinal appliance and device: Secondary | ICD-10-CM | POA: Diagnosis not present

## 2020-01-16 DIAGNOSIS — L299 Pruritus, unspecified: Secondary | ICD-10-CM | POA: Diagnosis not present

## 2020-01-16 DIAGNOSIS — T50905A Adverse effect of unspecified drugs, medicaments and biological substances, initial encounter: Secondary | ICD-10-CM | POA: Diagnosis not present

## 2020-01-17 DIAGNOSIS — J90 Pleural effusion, not elsewhere classified: Secondary | ICD-10-CM | POA: Diagnosis not present

## 2020-01-17 DIAGNOSIS — Z79899 Other long term (current) drug therapy: Secondary | ICD-10-CM | POA: Diagnosis not present

## 2020-01-17 DIAGNOSIS — L299 Pruritus, unspecified: Secondary | ICD-10-CM | POA: Diagnosis not present

## 2020-01-17 DIAGNOSIS — T50905A Adverse effect of unspecified drugs, medicaments and biological substances, initial encounter: Secondary | ICD-10-CM | POA: Diagnosis not present

## 2020-01-17 DIAGNOSIS — Z9049 Acquired absence of other specified parts of digestive tract: Secondary | ICD-10-CM | POA: Diagnosis not present

## 2020-01-17 DIAGNOSIS — A419 Sepsis, unspecified organism: Secondary | ICD-10-CM | POA: Diagnosis not present

## 2020-01-17 DIAGNOSIS — C250 Malignant neoplasm of head of pancreas: Secondary | ICD-10-CM | POA: Diagnosis not present

## 2020-01-17 DIAGNOSIS — F419 Anxiety disorder, unspecified: Secondary | ICD-10-CM | POA: Diagnosis not present

## 2020-01-19 DIAGNOSIS — C250 Malignant neoplasm of head of pancreas: Secondary | ICD-10-CM | POA: Diagnosis not present

## 2020-01-23 DIAGNOSIS — Z9049 Acquired absence of other specified parts of digestive tract: Secondary | ICD-10-CM | POA: Diagnosis not present

## 2020-01-23 DIAGNOSIS — Z79899 Other long term (current) drug therapy: Secondary | ICD-10-CM | POA: Diagnosis not present

## 2020-01-23 DIAGNOSIS — K838 Other specified diseases of biliary tract: Secondary | ICD-10-CM | POA: Diagnosis not present

## 2020-01-31 DIAGNOSIS — I10 Essential (primary) hypertension: Secondary | ICD-10-CM | POA: Diagnosis not present

## 2020-01-31 DIAGNOSIS — F064 Anxiety disorder due to known physiological condition: Secondary | ICD-10-CM | POA: Diagnosis not present

## 2020-01-31 DIAGNOSIS — Z853 Personal history of malignant neoplasm of breast: Secondary | ICD-10-CM | POA: Diagnosis not present

## 2020-01-31 DIAGNOSIS — E440 Moderate protein-calorie malnutrition: Secondary | ICD-10-CM | POA: Insufficient documentation

## 2020-01-31 DIAGNOSIS — Q453 Other congenital malformations of pancreas and pancreatic duct: Secondary | ICD-10-CM | POA: Diagnosis not present

## 2020-01-31 DIAGNOSIS — E569 Vitamin deficiency, unspecified: Secondary | ICD-10-CM | POA: Diagnosis not present

## 2020-01-31 DIAGNOSIS — K219 Gastro-esophageal reflux disease without esophagitis: Secondary | ICD-10-CM | POA: Diagnosis not present

## 2020-01-31 DIAGNOSIS — C259 Malignant neoplasm of pancreas, unspecified: Secondary | ICD-10-CM | POA: Diagnosis not present

## 2020-01-31 DIAGNOSIS — M6281 Muscle weakness (generalized): Secondary | ICD-10-CM | POA: Diagnosis not present

## 2020-01-31 DIAGNOSIS — F32A Depression, unspecified: Secondary | ICD-10-CM | POA: Diagnosis not present

## 2020-01-31 DIAGNOSIS — M62838 Other muscle spasm: Secondary | ICD-10-CM | POA: Diagnosis not present

## 2020-01-31 DIAGNOSIS — I80243 Phlebitis and thrombophlebitis of peroneal vein, bilateral: Secondary | ICD-10-CM | POA: Diagnosis not present

## 2020-02-01 DIAGNOSIS — I10 Essential (primary) hypertension: Secondary | ICD-10-CM | POA: Diagnosis not present

## 2020-02-01 DIAGNOSIS — K219 Gastro-esophageal reflux disease without esophagitis: Secondary | ICD-10-CM | POA: Diagnosis not present

## 2020-02-01 DIAGNOSIS — F064 Anxiety disorder due to known physiological condition: Secondary | ICD-10-CM | POA: Diagnosis not present

## 2020-02-01 DIAGNOSIS — Q453 Other congenital malformations of pancreas and pancreatic duct: Secondary | ICD-10-CM | POA: Diagnosis not present

## 2020-02-08 DIAGNOSIS — Z853 Personal history of malignant neoplasm of breast: Secondary | ICD-10-CM | POA: Diagnosis not present

## 2020-02-08 DIAGNOSIS — M797 Fibromyalgia: Secondary | ICD-10-CM | POA: Diagnosis not present

## 2020-02-08 DIAGNOSIS — H814 Vertigo of central origin: Secondary | ICD-10-CM | POA: Diagnosis not present

## 2020-02-08 DIAGNOSIS — E785 Hyperlipidemia, unspecified: Secondary | ICD-10-CM | POA: Diagnosis not present

## 2020-02-08 DIAGNOSIS — I10 Essential (primary) hypertension: Secondary | ICD-10-CM | POA: Diagnosis not present

## 2020-02-08 DIAGNOSIS — C250 Malignant neoplasm of head of pancreas: Secondary | ICD-10-CM | POA: Diagnosis not present

## 2020-02-08 DIAGNOSIS — Z483 Aftercare following surgery for neoplasm: Secondary | ICD-10-CM | POA: Diagnosis not present

## 2020-02-08 DIAGNOSIS — E440 Moderate protein-calorie malnutrition: Secondary | ICD-10-CM | POA: Diagnosis not present

## 2020-02-08 DIAGNOSIS — M1990 Unspecified osteoarthritis, unspecified site: Secondary | ICD-10-CM | POA: Diagnosis not present

## 2020-02-08 DIAGNOSIS — Z9011 Acquired absence of right breast and nipple: Secondary | ICD-10-CM | POA: Diagnosis not present

## 2020-02-08 DIAGNOSIS — R55 Syncope and collapse: Secondary | ICD-10-CM | POA: Diagnosis not present

## 2020-02-13 DIAGNOSIS — C259 Malignant neoplasm of pancreas, unspecified: Secondary | ICD-10-CM | POA: Diagnosis not present

## 2020-02-13 DIAGNOSIS — C250 Malignant neoplasm of head of pancreas: Secondary | ICD-10-CM | POA: Diagnosis not present

## 2020-02-21 DIAGNOSIS — Z483 Aftercare following surgery for neoplasm: Secondary | ICD-10-CM | POA: Diagnosis not present

## 2020-02-21 DIAGNOSIS — M1990 Unspecified osteoarthritis, unspecified site: Secondary | ICD-10-CM | POA: Diagnosis not present

## 2020-02-21 DIAGNOSIS — E440 Moderate protein-calorie malnutrition: Secondary | ICD-10-CM | POA: Diagnosis not present

## 2020-02-21 DIAGNOSIS — E785 Hyperlipidemia, unspecified: Secondary | ICD-10-CM | POA: Diagnosis not present

## 2020-02-21 DIAGNOSIS — H814 Vertigo of central origin: Secondary | ICD-10-CM | POA: Diagnosis not present

## 2020-02-21 DIAGNOSIS — C250 Malignant neoplasm of head of pancreas: Secondary | ICD-10-CM | POA: Diagnosis not present

## 2020-02-21 DIAGNOSIS — R55 Syncope and collapse: Secondary | ICD-10-CM | POA: Diagnosis not present

## 2020-02-21 DIAGNOSIS — Z9011 Acquired absence of right breast and nipple: Secondary | ICD-10-CM | POA: Diagnosis not present

## 2020-02-21 DIAGNOSIS — I10 Essential (primary) hypertension: Secondary | ICD-10-CM | POA: Diagnosis not present

## 2020-02-21 DIAGNOSIS — M797 Fibromyalgia: Secondary | ICD-10-CM | POA: Diagnosis not present

## 2020-02-21 DIAGNOSIS — Z853 Personal history of malignant neoplasm of breast: Secondary | ICD-10-CM | POA: Diagnosis not present

## 2020-02-27 DIAGNOSIS — C250 Malignant neoplasm of head of pancreas: Secondary | ICD-10-CM | POA: Diagnosis not present

## 2020-02-27 DIAGNOSIS — C259 Malignant neoplasm of pancreas, unspecified: Secondary | ICD-10-CM | POA: Diagnosis not present

## 2020-03-07 ENCOUNTER — Ambulatory Visit: Payer: PPO | Admitting: Oncology

## 2020-03-07 ENCOUNTER — Other Ambulatory Visit: Payer: PPO

## 2020-03-11 ENCOUNTER — Inpatient Hospital Stay: Payer: PPO | Admitting: Oncology

## 2020-03-11 ENCOUNTER — Inpatient Hospital Stay: Payer: PPO

## 2020-03-21 ENCOUNTER — Inpatient Hospital Stay: Payer: PPO

## 2020-03-21 ENCOUNTER — Inpatient Hospital Stay: Payer: PPO | Attending: Oncology | Admitting: Oncology

## 2020-03-21 ENCOUNTER — Other Ambulatory Visit: Payer: Self-pay

## 2020-03-21 VITALS — BP 165/77 | HR 64 | Temp 97.8°F | Wt 121.0 lb

## 2020-03-21 DIAGNOSIS — Z923 Personal history of irradiation: Secondary | ICD-10-CM

## 2020-03-21 DIAGNOSIS — Z9011 Acquired absence of right breast and nipple: Secondary | ICD-10-CM | POA: Diagnosis not present

## 2020-03-21 DIAGNOSIS — R1310 Dysphagia, unspecified: Secondary | ICD-10-CM

## 2020-03-21 DIAGNOSIS — Z79899 Other long term (current) drug therapy: Secondary | ICD-10-CM

## 2020-03-21 DIAGNOSIS — K219 Gastro-esophageal reflux disease without esophagitis: Secondary | ICD-10-CM | POA: Diagnosis not present

## 2020-03-21 DIAGNOSIS — C250 Malignant neoplasm of head of pancreas: Secondary | ICD-10-CM

## 2020-03-21 DIAGNOSIS — Z9221 Personal history of antineoplastic chemotherapy: Secondary | ICD-10-CM

## 2020-03-21 DIAGNOSIS — Z90710 Acquired absence of both cervix and uterus: Secondary | ICD-10-CM

## 2020-03-21 DIAGNOSIS — I10 Essential (primary) hypertension: Secondary | ICD-10-CM

## 2020-03-21 DIAGNOSIS — Z9079 Acquired absence of other genital organ(s): Secondary | ICD-10-CM

## 2020-03-21 DIAGNOSIS — F419 Anxiety disorder, unspecified: Secondary | ICD-10-CM | POA: Diagnosis not present

## 2020-03-21 DIAGNOSIS — Z7982 Long term (current) use of aspirin: Secondary | ICD-10-CM

## 2020-03-21 DIAGNOSIS — E785 Hyperlipidemia, unspecified: Secondary | ICD-10-CM

## 2020-03-21 DIAGNOSIS — Z853 Personal history of malignant neoplasm of breast: Secondary | ICD-10-CM | POA: Diagnosis not present

## 2020-03-21 DIAGNOSIS — Z7189 Other specified counseling: Secondary | ICD-10-CM

## 2020-03-21 NOTE — H&P (View-Only) (Signed)
Hematology/Oncology Consult note [facility] Telephone:([phone] [phone]  Patient Care Team: Baxter Hire, MD as PCP - General (Internal Medicine)   Name of the patient: Emily Harding  OP:[id number]  [birth year]    Reason for referral-pancreatic adenocarcinoma   Referring physician-Dr. Berneice Gandy  Date of visit: 03/21/20   History of presenting illness-patient is a 79 year old female with an oncology history as follows: 1.  On 11/22/2019 she presented with left lower quadrant abdominal pain and persistent nausea.  CT abdomen and pelvis showed a 3.1 x 2.3 cm mass in the head of the pancreas with diffuse pancreatic ductal dilatation.  CA 19-9 elevated at 166 2.  EUS showed 2.0 x 1.9 cm mass in the head of pancreas with no vessel involvement.  UT2N0.  Pathology showed adenocarcinoma 3.  CT chest showed 2 to 3 mm lung nodules with no evidence of distant metastatic disease.  In October 2021 patient underwent Whipple resection with pathology demonstrating 3.1 cm adenocarcinoma with mucinous features.  Uncinate margin positive for tumor.  Tumor approaches less than 1 mm within cautery.  Positive for PNI.  0 out of 18 lymph nodes positive for malignancy. 4.  Patient evaluated by medical oncology at [facility] and was recommended adjuvant chemotherapy followed by chemoradiation  Patient currently lives alone and is independent of her ADLs and IADLs.  She reports ongoing fatigue And at times difficulty swallowing.  Her daughter lives close by and helps her out as well.  ECOG PS- 1  Pain scale- 0   Review of systems- Review of Systems  Constitutional: Positive for malaise/fatigue. Negative for chills, fever and weight loss.  HENT: Negative for congestion, ear discharge and nosebleeds.   Eyes: Negative for blurred vision.  Respiratory: Negative for cough, hemoptysis, sputum production, shortness of breath and wheezing.   Cardiovascular: Negative for chest pain,  palpitations, orthopnea and claudication.  Gastrointestinal: Negative for abdominal pain, blood in stool, constipation, diarrhea, heartburn, melena, nausea and vomiting.  Genitourinary: Negative for dysuria, flank pain, frequency, hematuria and urgency.  Musculoskeletal: Negative for back pain, joint pain and myalgias.  Skin: Negative for rash.  Neurological: Negative for dizziness, tingling, focal weakness, seizures, weakness and headaches.  Endo/Heme/Allergies: Does not bruise/bleed easily.  Psychiatric/Behavioral: Negative for depression and suicidal ideas. The patient does not have insomnia.     Allergies  Allergen Reactions  . Azithromycin   . Codeine Sulfate Other (See Comments)  . Doxycycline Calcium Other (See Comments)  . Duloxetine Nausea Only  . Erythromycin Ethylsuccinate Other (See Comments)  . Macrolides And Ketolides Other (See Comments)  . Nitrofurantoin     Other reaction(s): Unknown  . Sulfa Antibiotics     Patient Active Problem List   Diagnosis Date Noted  . Hyperlipidemia, unspecified 09/16/2016  . Fibromyalgia 09/16/2016  . Colon polyp 09/16/2016  . Breast cancer ([place]) 09/16/2016  . Difficulty sleeping 12/17/2015  . BPV (benign positional vertigo), unspecified laterality 12/17/2015  . Trochanteric bursitis of right hip 11/06/2015  . Numbness and tingling 11/06/2015  . Low back pain radiating to right leg 11/06/2015  . Hypertension 11/22/2014  . Hx of adenomatous colonic polyps 10/09/2013  . GERD (gastroesophageal reflux disease) 10/09/2013  . Dysphagia, unspecified 10/09/2013     Past Medical History:  Diagnosis Date  . Anxiety   . Arthritis   . Breast cancer ([place]) 1993   RT MASTECTOMY  . Heartburn   . HTN (hypertension)   . Personal history of chemotherapy   . S/P  chemotherapy, time since greater than 12 weeks 1993   BREAST CA     Past Surgical History:  Procedure Laterality Date  . ABDOMINAL HYSTERECTOMY    . colon polyp removal    .  GALLBLADDER SURGERY    . LAPAROSCOPIC OOPHERECTOMY  1978  . MASTECTOMY Right 1993   BREAST CA  . removal of fallopian tubes  1989    Social History   Socioeconomic History  . Marital status: Widowed    Spouse name: Not on file  . Number of children: Not on file  . Years of education: Not on file  . Highest education level: Not on file  Occupational History  . Not on file  Tobacco Use  . Smoking status: Never Smoker  . Smokeless tobacco: Never Used  Substance and Sexual Activity  . Alcohol use: No  . Drug use: No  . Sexual activity: Not on file  Other Topics Concern  . Not on file  Social History Narrative  . Not on file   Social Determinants of Health   Financial Resource Strain: Not on file  Food Insecurity: Not on file  Transportation Needs: Not on file  Physical Activity: Not on file  Stress: Not on file  Social Connections: Not on file  Intimate Partner Violence: Not on file     Family History  Problem Relation Age of Onset  . Breast cancer Neg Hx   . Kidney cancer Neg Hx   . Bladder Cancer Neg Hx   . Prostate cancer Neg Hx      Current Outpatient Medications:  .  aspirin EC 81 MG tablet, Take 81 mg by mouth daily., Disp: , Rfl:  .  atenolol (TENORMIN) 50 MG tablet, Take 50 mg by mouth daily., Disp: , Rfl:  .  calcium-vitamin D (SM CALCIUM 500/VITAMIN D3) 500-400 MG-UNIT tablet, Take 1 tablet by mouth daily., Disp: , Rfl:  .  celecoxib (CELEBREX) 200 MG capsule, Take 200 mg by mouth daily., Disp: , Rfl:  .  clorazepate (TRANXENE) 3.75 MG tablet, Take 3.75 mg by mouth daily as needed., Disp: , Rfl:  .  cyclobenzaprine (FLEXERIL) 10 MG tablet, TAKE 1 TABLET (10 MG TOTAL) BY MOUTH 3 (THREE) TIMES DAILY AS NEEDED FOR MUSCLE SPASMS., Disp: , Rfl:  .  esomeprazole (NEXIUM) 40 MG capsule, Take by mouth., Disp: , Rfl:  .  gabapentin (NEURONTIN) 400 MG capsule, Take 400 mg by mouth 3 (three) times daily., Disp: , Rfl:  .  HYDROcodone-acetaminophen (NORCO/VICODIN)  5-325 MG tablet, Take 1 tablet by mouth every 6 (six) hours as needed., Disp: , Rfl:  .  losartan (COZAAR) 50 MG tablet, Take by mouth., Disp: , Rfl:  .  meloxicam (MOBIC) 7.5 MG tablet, Take 7.5 mg by mouth daily., Disp: , Rfl:  .  Multiple Vitamin (MULTI-VITAMINS) TABS, Take 1 tablet by mouth daily., Disp: , Rfl:  .  promethazine (PHENERGAN) 25 MG tablet, Take by mouth., Disp: , Rfl:  .  traZODone (DESYREL) 100 MG tablet, Take 100 mg by mouth at bedtime., Disp: , Rfl:    Physical exam:  Vitals:   03/21/20 1339  BP: (!) 165/77  Pulse: 64  Temp: 97.8 F (36.6 C)  TempSrc: Tympanic  SpO2: 98%  Weight: 121 lb (54.9 kg)   Physical Exam Eyes:     Extraocular Movements: EOM normal.  Cardiovascular:     Rate and Rhythm: Normal rate and regular rhythm.     Heart sounds: Normal heart sounds.  Pulmonary:     Effort: Pulmonary effort is normal.     Breath sounds: Normal breath sounds.  Abdominal:     General: Bowel sounds are normal.     Palpations: Abdomen is soft.     Comments: Midline surgical scar is healing well  Skin:    General: Skin is warm and dry.  Neurological:     Mental Status: She is alert and oriented to person, place, and time.        CMP Latest Ref Rng & Units 11/21/2019  Glucose 70 - 99 mg/dL -  BUN 8 - 23 mg/dL -  Creatinine 0.44 - 1.00 mg/dL 1.00  Sodium 135 - 145 mmol/L -  Potassium 3.5 - 5.1 mmol/L -  Chloride 98 - 111 mmol/L -  CO2 22 - 32 mmol/L -  Calcium 8.9 - 10.3 mg/dL -  Total Protein 6.5 - 8.1 g/dL -  Total Bilirubin 0.3 - 1.2 mg/dL -  Alkaline Phos 38 - 126 U/L -  AST 15 - 41 U/L -  ALT 0 - 44 U/L -   CBC Latest Ref Rng & Units 03/01/2019  WBC 4.0 - 10.5 K/uL 8.6  Hemoglobin 12.0 - 15.0 g/dL 13.0  Hematocrit 36.0 - 46.0 % 38.5  Platelets 150 - 400 K/uL 323     Assessment and plan- Patient is a 79 y.o. female with newly diagnosed pancreatic adenocarcinoma stage IB T2 N0 M0 s/p Whipple surgery.  Patient evaluated at [facility] and was  recommended adjuvant chemotherapy followed by possible consolidative chemoradiation based on tolerance and is here to discuss further management  Patient had biopsy-proven adenocarcinoma of the head of pancreas which was upfront resectable and patient underwent Whipple surgery at [facility] in November 2021 which showed a 3.1 cm tumor with positive uncal margin and perineural invasion.  18 lymph nodes negative for malignancy.  T2N0 stage Ib disease.Given that she had a positive uncinate margin with perineural invasion that was positive, she would also likely benefit from chemoradiation following chemotherapy based on tolerance  Patient has baseline neuropathy especially in her left leg and has a disc bulge at L4-L5 level for which she sees Dr. Sharlet Salina.  Modified FOLFIRINOX and gem Abraxane are reasonable adjuvant chemotherapy options.  However given patient's age, pre-existing neuropathy and recent Whipple surgery; gemcitabine and Abraxane combination will likely be better tolerated.  However there is a national back order for Abraxane and we are therefore not going to be able to give it to her on a consistent basis.  Abraxane is also associated with risks of peripheral neuropathy. The APACT trial which compares gem Abraxane versus gemcitabine in adjuvant setting showed median disease-free survival 19.4 months for gem Abraxane versus 18.8 months for gemcitabine alone.    A prior CONKO-01 trial of gemcitabine versus observation alone post resection showed disease-free survival of 14.2 months with gemcitabine versus 7.5 months with observation alone.  We also discussed data for gemcitabine and Xeloda versus gemcitabine alone per ESPAC trial  which showed improvement in Overall survival of 30.2 months versus 27.9 months.Discussed risks and benefits of Xeloda including all but not limited to nausea, vomiting, hand-foot syndrome, possible colitis and diarrhea.  She will also need to swallow 2 to 3 pills twice a day and  patient has baseline difficulty swallowing.  I gave her the option of either pursuing single agent gemcitabine versus gemcitabine and Xeloda combination.  Patient prefers to proceed with single agent gemcitabine at this time.  This regimen is typically given  at 1000 mg per metered squared 3 weeks on 1 week off and she would need chemotherapy alone for 6 months followed by possible concurrent chemoradiation following that.  Discussed risks and benefits of gemcitabine including all but not limited to nausea, vomiting, low blood counts, risk of infections and hospitalizations.  Treatment will be given with a curative intent.  Patient understands and agrees to proceed as planned.  She would ideally like to do a 2-week on 1 week off regimen and see if she can tolerate that better answer of pursuing a 3-week on 1 week off regimen.  I will tentatively see her on 04/08/2020 to start the first cycle of gemcitabine.  Patient will need port placement prior as well as chemotherapy teach but patient states that she may not be able to sit through a 3-hour chemo class given her chronic back pain issues  Cancer Staging Malignant neoplasm of head of pancreas [facility]) Staging form: Exocrine Pancreas, AJCC 8th Edition - Pathologic stage from 03/21/2020: Stage IB (pT2, pN0, cM0) - Signed by Creig Hines, MD on 03/21/2020     Thank you for this kind referral and the opportunity to participate in the care of this patient   Visit Diagnosis 1. Malignant neoplasm of head of pancreas (HCC)   2. Goals of care, counseling/discussion     Dr. Owens Shark, MD, MPH [facility] [phone] 03/21/2020  3:48 PM

## 2020-03-21 NOTE — Progress Notes (Signed)
Hematology/Oncology Consult note [facility] Telephone:([phone] [phone]  Patient Care Team: Baxter Hire, MD as PCP - General (Internal Medicine)   Name of the patient: Emily Harding  [id number]  Jul 06, 1940    Reason for referral-pancreatic adenocarcinoma   Referring physician-Dr. Berneice Gandy  Date of visit: 03/21/20   History of presenting illness-patient is a 79 year old female with an oncology history as follows: 1.  On 11/22/2019 she presented with left lower quadrant abdominal pain and persistent nausea.  CT abdomen and pelvis showed a 3.1 x 2.3 cm mass in the head of the pancreas with diffuse pancreatic ductal dilatation.  CA 19-9 elevated at 166 2.  EUS showed 2.0 x 1.9 cm mass in the head of pancreas with no vessel involvement.  UT2N0.  Pathology showed adenocarcinoma 3.  CT chest showed 2 to 3 mm lung nodules with no evidence of distant metastatic disease.  In October 2021 patient underwent Whipple resection with pathology demonstrating 3.1 cm adenocarcinoma with mucinous features.  Uncinate margin positive for tumor.  Tumor approaches less than 1 mm within cautery.  Positive for PNI.  0 out of 18 lymph nodes positive for malignancy. 4.  Patient evaluated by medical oncology at [facility] and was recommended adjuvant chemotherapy followed by chemoradiation  Patient currently lives alone and is independent of her ADLs and IADLs.  She reports ongoing fatigue And at times difficulty swallowing.  Her daughter lives close by and helps her out as well.  ECOG PS- 1  Pain scale- 0   Review of systems- Review of Systems  Constitutional: Positive for malaise/fatigue. Negative for chills, fever and weight loss.  HENT: Negative for congestion, ear discharge and nosebleeds.   Eyes: Negative for blurred vision.  Respiratory: Negative for cough, hemoptysis, sputum production, shortness of breath and wheezing.   Cardiovascular: Negative for chest pain,  palpitations, orthopnea and claudication.  Gastrointestinal: Negative for abdominal pain, blood in stool, constipation, diarrhea, heartburn, melena, nausea and vomiting.  Genitourinary: Negative for dysuria, flank pain, frequency, hematuria and urgency.  Musculoskeletal: Negative for back pain, joint pain and myalgias.  Skin: Negative for rash.  Neurological: Negative for dizziness, tingling, focal weakness, seizures, weakness and headaches.  Endo/Heme/Allergies: Does not bruise/bleed easily.  Psychiatric/Behavioral: Negative for depression and suicidal ideas. The patient does not have insomnia.     Allergies  Allergen Reactions  . Azithromycin   . Codeine Sulfate Other (See Comments)  . Doxycycline Calcium Other (See Comments)  . Duloxetine Nausea Only  . Erythromycin Ethylsuccinate Other (See Comments)  . Macrolides And Ketolides Other (See Comments)  . Nitrofurantoin     Other reaction(s): Unknown  . Sulfa Antibiotics     Patient Active Problem List   Diagnosis Date Noted  . Hyperlipidemia, unspecified 09/16/2016  . Fibromyalgia 09/16/2016  . Colon polyp 09/16/2016  . Breast cancer ([place]) 09/16/2016  . Difficulty sleeping 12/17/2015  . BPV (benign positional vertigo), unspecified laterality 12/17/2015  . Trochanteric bursitis of right hip 11/06/2015  . Numbness and tingling 11/06/2015  . Low back pain radiating to right leg 11/06/2015  . Hypertension 11/22/2014  . Hx of adenomatous colonic polyps 10/09/2013  . GERD (gastroesophageal reflux disease) 10/09/2013  . Dysphagia, unspecified 10/09/2013     Past Medical History:  Diagnosis Date  . Anxiety   . Arthritis   . Breast cancer ([place]) 1993   RT MASTECTOMY  . Heartburn   . HTN (hypertension)   . Personal history of chemotherapy   . S/P  chemotherapy, time since greater than 12 weeks 1993   BREAST CA     Past Surgical History:  Procedure Laterality Date  . ABDOMINAL HYSTERECTOMY    . colon polyp removal    .  GALLBLADDER SURGERY    . LAPAROSCOPIC OOPHERECTOMY  1978  . MASTECTOMY Right 1993   BREAST CA  . removal of fallopian tubes  1989    Social History   Socioeconomic History  . Marital status: Widowed    Spouse name: Not on file  . Number of children: Not on file  . Years of education: Not on file  . Highest education level: Not on file  Occupational History  . Not on file  Tobacco Use  . Smoking status: Never Smoker  . Smokeless tobacco: Never Used  Substance and Sexual Activity  . Alcohol use: No  . Drug use: No  . Sexual activity: Not on file  Other Topics Concern  . Not on file  Social History Narrative  . Not on file   Social Determinants of Health   Financial Resource Strain: Not on file  Food Insecurity: Not on file  Transportation Needs: Not on file  Physical Activity: Not on file  Stress: Not on file  Social Connections: Not on file  Intimate Partner Violence: Not on file     Family History  Problem Relation Age of Onset  . Breast cancer Neg Hx   . Kidney cancer Neg Hx   . Bladder Cancer Neg Hx   . Prostate cancer Neg Hx      Current Outpatient Medications:  .  aspirin EC 81 MG tablet, Take 81 mg by mouth daily., Disp: , Rfl:  .  atenolol (TENORMIN) 50 MG tablet, Take 50 mg by mouth daily., Disp: , Rfl:  .  calcium-vitamin D (SM CALCIUM 500/VITAMIN D3) 500-400 MG-UNIT tablet, Take 1 tablet by mouth daily., Disp: , Rfl:  .  celecoxib (CELEBREX) 200 MG capsule, Take 200 mg by mouth daily., Disp: , Rfl:  .  clorazepate (TRANXENE) 3.75 MG tablet, Take 3.75 mg by mouth daily as needed., Disp: , Rfl:  .  cyclobenzaprine (FLEXERIL) 10 MG tablet, TAKE 1 TABLET (10 MG TOTAL) BY MOUTH 3 (THREE) TIMES DAILY AS NEEDED FOR MUSCLE SPASMS., Disp: , Rfl:  .  esomeprazole (NEXIUM) 40 MG capsule, Take by mouth., Disp: , Rfl:  .  gabapentin (NEURONTIN) 400 MG capsule, Take 400 mg by mouth 3 (three) times daily., Disp: , Rfl:  .  HYDROcodone-acetaminophen (NORCO/VICODIN)  5-325 MG tablet, Take 1 tablet by mouth every 6 (six) hours as needed., Disp: , Rfl:  .  losartan (COZAAR) 50 MG tablet, Take by mouth., Disp: , Rfl:  .  meloxicam (MOBIC) 7.5 MG tablet, Take 7.5 mg by mouth daily., Disp: , Rfl:  .  Multiple Vitamin (MULTI-VITAMINS) TABS, Take 1 tablet by mouth daily., Disp: , Rfl:  .  promethazine (PHENERGAN) 25 MG tablet, Take by mouth., Disp: , Rfl:  .  traZODone (DESYREL) 100 MG tablet, Take 100 mg by mouth at bedtime., Disp: , Rfl:    Physical exam:  Vitals:   03/21/20 1339  BP: (!) 165/77  Pulse: 64  Temp: 97.8 F (36.6 C)  TempSrc: Tympanic  SpO2: 98%  Weight: 121 lb (54.9 kg)   Physical Exam Eyes:     Extraocular Movements: EOM normal.  Cardiovascular:     Rate and Rhythm: Normal rate and regular rhythm.     Heart sounds: Normal heart sounds.  Pulmonary:     Effort: Pulmonary effort is normal.     Breath sounds: Normal breath sounds.  Abdominal:     General: Bowel sounds are normal.     Palpations: Abdomen is soft.     Comments: Midline surgical scar is healing well  Skin:    General: Skin is warm and dry.  Neurological:     Mental Status: She is alert and oriented to person, place, and time.        CMP Latest Ref Rng & Units 11/21/2019  Glucose 70 - 99 mg/dL -  BUN 8 - 23 mg/dL -  Creatinine 0.44 - 1.00 mg/dL 1.00  Sodium 135 - 145 mmol/L -  Potassium 3.5 - 5.1 mmol/L -  Chloride 98 - 111 mmol/L -  CO2 22 - 32 mmol/L -  Calcium 8.9 - 10.3 mg/dL -  Total Protein 6.5 - 8.1 g/dL -  Total Bilirubin 0.3 - 1.2 mg/dL -  Alkaline Phos 38 - 126 U/L -  AST 15 - 41 U/L -  ALT 0 - 44 U/L -   CBC Latest Ref Rng & Units 03/01/2019  WBC 4.0 - 10.5 K/uL 8.6  Hemoglobin 12.0 - 15.0 g/dL 13.0  Hematocrit 36.0 - 46.0 % 38.5  Platelets 150 - 400 K/uL 323     Assessment and plan- Patient is a 79 y.o. female with newly diagnosed pancreatic adenocarcinoma stage IB T2 N0 M0 s/p Whipple surgery.  Patient evaluated at [facility] and was  recommended adjuvant chemotherapy followed by possible consolidative chemoradiation based on tolerance and is here to discuss further management  Patient had biopsy-proven adenocarcinoma of the head of pancreas which was upfront resectable and patient underwent Whipple surgery at [facility] in November 2021 which showed a 3.1 cm tumor with positive uncal margin and perineural invasion.  18 lymph nodes negative for malignancy.  T2N0 stage Ib disease.Given that she had a positive uncinate margin with perineural invasion that was positive, she would also likely benefit from chemoradiation following chemotherapy based on tolerance  Patient has baseline neuropathy especially in her left leg and has a disc bulge at L4-L5 level for which she sees Dr. Chasnis.  Modified FOLFIRINOX and gem Abraxane are reasonable adjuvant chemotherapy options.  However given patient's age, pre-existing neuropathy and recent Whipple surgery; gemcitabine and Abraxane combination will likely be better tolerated.  However there is a national back order for Abraxane and we are therefore not going to be able to give it to her on a consistent basis.  Abraxane is also associated with risks of peripheral neuropathy. The APACT trial which compares gem Abraxane versus gemcitabine in adjuvant setting showed median disease-free survival 19.4 months for gem Abraxane versus 18.8 months for gemcitabine alone.    A prior CONKO-01 trial of gemcitabine versus observation alone post resection showed disease-free survival of 14.2 months with gemcitabine versus 7.5 months with observation alone.  We also discussed data for gemcitabine and Xeloda versus gemcitabine alone per ESPAC trial  which showed improvement in Overall survival of 30.2 months versus 27.9 months.Discussed risks and benefits of Xeloda including all but not limited to nausea, vomiting, hand-foot syndrome, possible colitis and diarrhea.  She will also need to swallow 2 to 3 pills twice a day and  patient has baseline difficulty swallowing.  I gave her the option of either pursuing single agent gemcitabine versus gemcitabine and Xeloda combination.  Patient prefers to proceed with single agent gemcitabine at this time.  This regimen is typically given   at 1000 mg per metered squared 3 weeks on 1 week off and she would need chemotherapy alone for 6 months followed by possible concurrent chemoradiation following that.  Discussed risks and benefits of gemcitabine including all but not limited to nausea, vomiting, low blood counts, risk of infections and hospitalizations.  Treatment will be given with a curative intent.  Patient understands and agrees to proceed as planned.  She would ideally like to do a 2-week on 1 week off regimen and see if she can tolerate that better answer of pursuing a 3-week on 1 week off regimen.  I will tentatively see her on 04/08/2020 to start the first cycle of gemcitabine.  Patient will need port placement prior as well as chemotherapy teach but patient states that she may not be able to sit through a 3-hour chemo class given her chronic back pain issues  Cancer Staging Malignant neoplasm of head of pancreas [facility]) Staging form: Exocrine Pancreas, AJCC 8th Edition - Pathologic stage from 03/21/2020: Stage IB (pT2, pN0, cM0) - Signed by Creig Hines, MD on 03/21/2020     Thank you for this kind referral and the opportunity to participate in the care of this patient   Visit Diagnosis 1. Malignant neoplasm of head of pancreas (HCC)   2. Goals of care, counseling/discussion     Dr. Owens Shark, MD, MPH [facility] [phone] 03/21/2020  3:48 PM

## 2020-03-25 ENCOUNTER — Encounter: Payer: Self-pay | Admitting: Oncology

## 2020-03-25 MED ORDER — LIDOCAINE-PRILOCAINE 2.5-2.5 % EX CREA: TOPICAL_CREAM | CUTANEOUS | 3 refills | Status: DC

## 2020-03-25 MED ORDER — LORAZEPAM 0.5 MG PO TABS: 0.5000 mg | ORAL_TABLET | Freq: Four times a day (QID) | ORAL | 0 refills | Status: DC | PRN

## 2020-03-25 MED ORDER — PROCHLORPERAZINE MALEATE 10 MG PO TABS: 10.0000 mg | ORAL_TABLET | Freq: Four times a day (QID) | ORAL | 1 refills | Status: DC | PRN

## 2020-03-25 MED ORDER — ONDANSETRON HCL 8 MG PO TABS: 8.0000 mg | ORAL_TABLET | Freq: Two times a day (BID) | ORAL | 1 refills | Status: DC | PRN

## 2020-03-25 NOTE — Progress Notes (Signed)
START OFF PATHWAY REGIMEN - Pancreatic Adenocarcinoma   OFF00015:Gemcitabine 1,000 mg/m2 IV D1,8,15 q28 Days:   A cycle is every 28 days:     Gemcitabine   **Always confirm dose/schedule in your pharmacy ordering system**  Patient Characteristics: Postoperative without Neoadjuvant Therapy (Pathologic Staging), Positive Margins Therapeutic Status: Postoperative without Neoadjuvant Therapy (Pathologic Staging) AJCC T Category: pT2 AJCC N Category: pN0 AJCC M Category: cM0 AJCC 8 Stage Grouping: IB Intent of Therapy: Curative Intent, Discussed with Patient

## 2020-03-26 ENCOUNTER — Other Ambulatory Visit: Payer: PPO

## 2020-03-26 ENCOUNTER — Ambulatory Visit: Payer: PPO | Admitting: Oncology

## 2020-03-26 DIAGNOSIS — E785 Hyperlipidemia, unspecified: Secondary | ICD-10-CM | POA: Insufficient documentation

## 2020-03-27 ENCOUNTER — Telehealth: Payer: Self-pay | Admitting: *Deleted

## 2020-03-27 ENCOUNTER — Other Ambulatory Visit: Payer: Self-pay | Admitting: *Deleted

## 2020-03-27 ENCOUNTER — Inpatient Hospital Stay: Payer: Medicare Other | Attending: Oncology | Admitting: Hospice and Palliative Medicine

## 2020-03-27 ENCOUNTER — Other Ambulatory Visit: Payer: Self-pay

## 2020-03-27 DIAGNOSIS — Z79899 Other long term (current) drug therapy: Secondary | ICD-10-CM | POA: Insufficient documentation

## 2020-03-27 DIAGNOSIS — F419 Anxiety disorder, unspecified: Secondary | ICD-10-CM | POA: Insufficient documentation

## 2020-03-27 DIAGNOSIS — Z5111 Encounter for antineoplastic chemotherapy: Secondary | ICD-10-CM | POA: Insufficient documentation

## 2020-03-27 DIAGNOSIS — I10 Essential (primary) hypertension: Secondary | ICD-10-CM | POA: Insufficient documentation

## 2020-03-27 DIAGNOSIS — Z90710 Acquired absence of both cervix and uterus: Secondary | ICD-10-CM | POA: Insufficient documentation

## 2020-03-27 DIAGNOSIS — C250 Malignant neoplasm of head of pancreas: Secondary | ICD-10-CM | POA: Insufficient documentation

## 2020-03-27 DIAGNOSIS — Z9221 Personal history of antineoplastic chemotherapy: Secondary | ICD-10-CM | POA: Insufficient documentation

## 2020-03-27 DIAGNOSIS — Z9011 Acquired absence of right breast and nipple: Secondary | ICD-10-CM | POA: Insufficient documentation

## 2020-03-27 DIAGNOSIS — K5900 Constipation, unspecified: Secondary | ICD-10-CM | POA: Insufficient documentation

## 2020-03-27 DIAGNOSIS — Z853 Personal history of malignant neoplasm of breast: Secondary | ICD-10-CM | POA: Insufficient documentation

## 2020-03-27 DIAGNOSIS — Z7982 Long term (current) use of aspirin: Secondary | ICD-10-CM | POA: Insufficient documentation

## 2020-03-27 DIAGNOSIS — Z9079 Acquired absence of other genital organ(s): Secondary | ICD-10-CM | POA: Insufficient documentation

## 2020-03-27 MED ORDER — PROCHLORPERAZINE MALEATE 10 MG PO TABS: 10.0000 mg | ORAL_TABLET | Freq: Four times a day (QID) | ORAL | 1 refills | Status: AC | PRN

## 2020-03-27 MED ORDER — LIDOCAINE-PRILOCAINE 2.5-2.5 % EX CREA: TOPICAL_CREAM | CUTANEOUS | 3 refills | Status: AC

## 2020-03-27 MED ORDER — LORAZEPAM 0.5 MG PO TABS: 0.5000 mg | ORAL_TABLET | Freq: Four times a day (QID) | ORAL | 0 refills | Status: DC | PRN

## 2020-03-27 MED ORDER — ONDANSETRON HCL 8 MG PO TABS: 8.0000 mg | ORAL_TABLET | Freq: Two times a day (BID) | ORAL | 1 refills | Status: DC | PRN

## 2020-03-27 NOTE — Telephone Encounter (Signed)
Call from [facility] stating that Zofran has been approved for 1 year abdominal that they call [facility] and patient to inform of this and patient told her that she is not using [facility], she is using [facility]. I attempted calling patient but did not reach. I left message on voice mail to call back to confirm her pharmacy

## 2020-03-27 NOTE — Telephone Encounter (Signed)
I called the pt to make sure that I got the correct pharmacy and it was [facility] on [address] also she asked about all the meds.She has had breast cancer along time ago and she was familiar with zofran and ativan. I told her that I would resend all the rx to the [facility] and I apologized for the error.

## 2020-03-27 NOTE — Progress Notes (Signed)
Multidisciplinary Oncology Council Documentation  Emily Harding was presented by our [facility] on 03/27/2020, which included representatives from:  . Palliative Care . Dietitian . Physical/Occupational Therapist . Speech Therapist . Nurse Navigator . Genetics    Emily Harding currently presents with history of stage I pancreatic cancer  We reviewed previous medical and familial history, history of present illness, and recent lab results along with all available histopathologic and imaging studies. The MOC considered available treatment options and made the following recommendations/referrals:  Recommend genetics, nutrition, and ST/OT consults.   The MOC is a meeting of clinicians from various specialty areas who evaluate and discuss patients for whom a multidisciplinary approach is being considered. Final determinations in the plan of care are those of the provider(s).   Today's extended care, comprehensive team conference, Emily Harding was not present for the discussion and was not examined.

## 2020-03-28 ENCOUNTER — Encounter (INDEPENDENT_AMBULATORY_CARE_PROVIDER_SITE_OTHER): Payer: Self-pay | Admitting: Vascular Surgery

## 2020-04-01 ENCOUNTER — Other Ambulatory Visit (INDEPENDENT_AMBULATORY_CARE_PROVIDER_SITE_OTHER): Payer: Self-pay | Admitting: Nurse Practitioner

## 2020-04-02 ENCOUNTER — Other Ambulatory Visit
Admission: RE | Admit: 2020-04-02 | Discharge: 2020-04-02 | Disposition: A | Discharge status: Home / Self Care | Payer: Medicare Other | Source: Ambulatory Visit | Attending: Vascular Surgery | Admitting: Vascular Surgery

## 2020-04-02 ENCOUNTER — Other Ambulatory Visit: Payer: Self-pay

## 2020-04-02 ENCOUNTER — Telehealth: Payer: Self-pay | Admitting: *Deleted

## 2020-04-02 DIAGNOSIS — Z01812 Encounter for preprocedural laboratory examination: Secondary | ICD-10-CM | POA: Diagnosis present

## 2020-04-02 DIAGNOSIS — Z20822 Contact with and (suspected) exposure to covid-19: Secondary | ICD-10-CM | POA: Insufficient documentation

## 2020-04-02 NOTE — Telephone Encounter (Signed)
I went over how to use the emla cream and when to use it. She also asked about her nausea med. She gets nauseated at times and also some anxiety about having chemo. I assured her that after she gets a couple of treatments done then she will probably know how she will feel if any side effects. She has 3 nausea pills to take. She wondered  if she needs to take nausea med before she comes in. I told her that she is getting 3 meds for nausea IV when she gets her chemo. If she needs any med for nausea at home she should take compazine if it the same day as the infusion and she needs to wait  6 hours from the drugs she got in infusion if she needs the med. She understands about how to take the nasuea meds.

## 2020-04-02 NOTE — Telephone Encounter (Signed)
Patient called requesting a return call to discuss a letter that she got, questions about a procedure she is having a nd a cream she needs. Please return her call [phone]

## 2020-04-03 LAB — SARS CORONAVIRUS 2 (TAT 6-24 HRS): SARS Coronavirus 2: NEGATIVE

## 2020-04-04 ENCOUNTER — Other Ambulatory Visit: Payer: Self-pay

## 2020-04-04 ENCOUNTER — Encounter
Admission: RE | Disposition: A | Discharge status: Home / Self Care | Payer: Self-pay | Source: Home / Self Care | Attending: Vascular Surgery

## 2020-04-04 ENCOUNTER — Ambulatory Visit
Admission: RE | Admit: 2020-04-04 | Discharge: 2020-04-04 | Disposition: A | Discharge status: Home / Self Care | Payer: Medicare Other | Attending: Vascular Surgery | Admitting: Vascular Surgery

## 2020-04-04 ENCOUNTER — Encounter: Payer: Self-pay | Admitting: Vascular Surgery

## 2020-04-04 DIAGNOSIS — Z791 Long term (current) use of non-steroidal anti-inflammatories (NSAID): Secondary | ICD-10-CM | POA: Insufficient documentation

## 2020-04-04 DIAGNOSIS — Z79899 Other long term (current) drug therapy: Secondary | ICD-10-CM | POA: Diagnosis not present

## 2020-04-04 DIAGNOSIS — Z7982 Long term (current) use of aspirin: Secondary | ICD-10-CM | POA: Diagnosis not present

## 2020-04-04 DIAGNOSIS — C250 Malignant neoplasm of head of pancreas: Secondary | ICD-10-CM | POA: Diagnosis not present

## 2020-04-04 DIAGNOSIS — Z882 Allergy status to sulfonamides status: Secondary | ICD-10-CM | POA: Insufficient documentation

## 2020-04-04 DIAGNOSIS — C259 Malignant neoplasm of pancreas, unspecified: Secondary | ICD-10-CM | POA: Diagnosis not present

## 2020-04-04 DIAGNOSIS — Z885 Allergy status to narcotic agent status: Secondary | ICD-10-CM | POA: Diagnosis not present

## 2020-04-04 HISTORY — PX: PORTA CATH INSERTION: CATH118285

## 2020-04-04 SURGERY — PORTA CATH INSERTION
Anesthesia: Moderate Sedation

## 2020-04-04 MED ORDER — MIDAZOLAM HCL 2 MG/2ML IJ SOLN
INTRAMUSCULAR | Status: DC | PRN
  Administered 2020-04-04: 1 mg via INTRAVENOUS
  Administered 2020-04-04: 2 mg via INTRAVENOUS

## 2020-04-04 MED ORDER — SODIUM CHLORIDE 0.9 % IV SOLN
Freq: Once | INTRAVENOUS | Status: DC
  Filled 2020-04-04: qty 2

## 2020-04-04 MED ORDER — MIDAZOLAM HCL 2 MG/ML PO SYRP: 8.0000 mg | ORAL_SOLUTION | Freq: Once | ORAL | Status: DC | PRN

## 2020-04-04 MED ORDER — FENTANYL CITRATE (PF) 100 MCG/2ML IJ SOLN
INTRAMUSCULAR | Status: AC
  Filled 2020-04-04: qty 2

## 2020-04-04 MED ORDER — MIDAZOLAM HCL 5 MG/5ML IJ SOLN
INTRAMUSCULAR | Status: AC
  Filled 2020-04-04: qty 5

## 2020-04-04 MED ORDER — CHLORHEXIDINE GLUCONATE CLOTH 2 % EX PADS
6.0000 | MEDICATED_PAD | Freq: Every day | CUTANEOUS | Status: DC
  Administered 2020-04-04: 6 via TOPICAL

## 2020-04-04 MED ORDER — DIPHENHYDRAMINE HCL 50 MG/ML IJ SOLN: 50.0000 mg | Freq: Once | INTRAMUSCULAR | Status: DC | PRN

## 2020-04-04 MED ORDER — CEFAZOLIN SODIUM-DEXTROSE 2-4 GM/100ML-% IV SOLN: 2.0000 g | Freq: Once | INTRAVENOUS | Status: AC

## 2020-04-04 MED ORDER — FAMOTIDINE 20 MG PO TABS: 40.0000 mg | ORAL_TABLET | Freq: Once | ORAL | Status: DC | PRN

## 2020-04-04 MED ORDER — METHYLPREDNISOLONE SODIUM SUCC 125 MG IJ SOLR: 125.0000 mg | Freq: Once | INTRAMUSCULAR | Status: DC | PRN

## 2020-04-04 MED ORDER — ONDANSETRON HCL 4 MG/2ML IJ SOLN: 4.0000 mg | Freq: Four times a day (QID) | INTRAMUSCULAR | Status: DC | PRN

## 2020-04-04 MED ORDER — SODIUM CHLORIDE 0.9 % IV SOLN: INTRAVENOUS | Status: DC

## 2020-04-04 MED ORDER — FENTANYL CITRATE (PF) 100 MCG/2ML IJ SOLN: 12.5000 ug | Freq: Once | INTRAMUSCULAR | Status: DC | PRN

## 2020-04-04 MED ORDER — FENTANYL CITRATE (PF) 100 MCG/2ML IJ SOLN
INTRAMUSCULAR | Status: DC | PRN
  Administered 2020-04-04: 50 ug via INTRAVENOUS
  Administered 2020-04-04: 25 ug via INTRAVENOUS

## 2020-04-04 MED ORDER — CEFAZOLIN SODIUM-DEXTROSE 2-4 GM/100ML-% IV SOLN
INTRAVENOUS | Status: AC
  Administered 2020-04-04: 2 g via INTRAVENOUS
  Filled 2020-04-04: qty 100

## 2020-04-04 SURGICAL SUPPLY — 13 items
DERMABOND ADVANCED (GAUZE/BANDAGES/DRESSINGS) ×1
DERMABOND ADVANCED .7 DNX12 (GAUZE/BANDAGES/DRESSINGS) ×1 IMPLANT
HANDLE YANKAUER SUCT BULB TIP (MISCELLANEOUS) ×2 IMPLANT
KIT PORT POWER 8FR ISP CVUE (Port) ×2 IMPLANT
PACK ANGIOGRAPHY (CUSTOM PROCEDURE TRAY) ×2 IMPLANT
PENCIL ELECTRO HAND CTR (MISCELLANEOUS) ×2 IMPLANT
SUT MNCRL 4-0 (SUTURE) ×1
SUT MNCRL 4-0 27XMFL (SUTURE) ×1
SUT VIC AB 3-0 CT1 27 (SUTURE) ×1
SUT VIC AB 3-0 CT1 TAPERPNT 27 (SUTURE) ×1 IMPLANT
SUTURE MNCRL 4-0 27XMF (SUTURE) ×1 IMPLANT
TOWEL OR 17X26 4PK STRL BLUE (TOWEL DISPOSABLE) ×2 IMPLANT
TUBE SUCTION BOTTLE EVAC LL (MISCELLANEOUS) ×2 IMPLANT

## 2020-04-04 NOTE — Op Note (Signed)
      [facility]       Operative Note  Date: 04/04/2020  Preoperative diagnosis:  1. Pancreatic cancer  Postoperative diagnosis:  Same as above  Procedures: #1. Ultrasound guidance for vascular access to the right internal jugular vein. #2. Fluoroscopic guidance for placement of catheter. #3. Placement of CT compatible Port-A-Cath, right internal jugular vein.  Surgeon: Leotis Pain, MD.   Anesthesia: Local with moderate conscious sedation for approximately 21  minutes using 3 mg of Versed and 75 mcg of Fentanyl  Fluoroscopy time: less than 1 minute  Contrast used: 0  Estimated blood loss: 5 cc  Indication for the procedure:  The patient is a 80 y.o.female with pancreatic cancer.  The patient needs a Port-A-Cath for durable venous access, chemotherapy, lab draws, and CT scans. We are asked to place this. Risks and benefits were discussed and informed consent was obtained.  Description of procedure: The patient was brought to the vascular and interventional radiology suite.  Moderate conscious sedation was administered throughout the procedure during a face to face encounter with the patient with my supervision of the RN administering medicines and monitoring the patient's vital signs, pulse oximetry, telemetry and mental status throughout from the start of the procedure until the patient was taken to the recovery room. The right neck chest and shoulder were sterilely prepped and draped, and a sterile surgical field was created. Ultrasound was used to help visualize a patent right internal jugular vein. This was then accessed under direct ultrasound guidance without difficulty with the Seldinger needle and a permanent image was recorded. A J-wire was placed. After skin nick and dilatation, the peel-away sheath was then placed over the wire. I then anesthetized an area under the clavicle approximately 1-2 fingerbreadths. A transverse incision was created and an inferior  pocket was created with electrocautery and blunt dissection. The port was then brought onto the field, placed into the pocket and secured to the chest wall with 2 Prolene sutures. The catheter was connected to the port and tunneled from the subclavicular incision to the access site. Fluoroscopic guidance was then used to cut the catheter to an appropriate length. The catheter was then placed through the peel-away sheath and the peel-away sheath was removed. The catheter tip was parked in excellent location under fluorocoscopic guidance in the cavoatrial junction. The pocket was then irrigated with antibiotic impregnated saline and the wound was closed with a running 3-0 Vicryl and a 4-0 Monocryl. The access incision was closed with a single 4-0 Monocryl. The Huber needle was used to withdraw blood and flush the port with heparinized saline. Dermabond was then placed as a dressing. The patient tolerated the procedure well and was taken to the recovery room in stable condition.   Leotis Pain 04/04/2020 11:31 AM   This note was created with Dragon Medical transcription system. Any errors in dictation are purely unintentional.

## 2020-04-04 NOTE — Patient Instructions (Signed)
Gemcitabine injection What is this medicine? GEMCITABINE (jem SYE ta been) is a chemotherapy drug. This medicine is used to treat many types of cancer like breast cancer, lung cancer, pancreatic cancer, and ovarian cancer. This medicine may be used for other purposes; ask your health care provider or pharmacist if you have questions. COMMON BRAND NAME(S): Gemzar, Infugem What should I tell my health care provider before I take this medicine? They need to know if you have any of these conditions:  blood disorders  infection  kidney disease  liver disease  lung or breathing disease, like asthma  recent or ongoing radiation therapy  an unusual or allergic reaction to gemcitabine, other chemotherapy, other medicines, foods, dyes, or preservatives  pregnant or trying to get pregnant  breast-feeding How should I use this medicine? This drug is given as an infusion into a vein. It is administered in a hospital or clinic by a specially trained health care professional. Talk to your pediatrician regarding the use of this medicine in children. Special care may be needed. Overdosage: If you think you have taken too much of this medicine contact a poison control center or emergency room at once. NOTE: This medicine is only for you. Do not share this medicine with others. What if I miss a dose? It is important not to miss your dose. Call your doctor or health care professional if you are unable to keep an appointment. What may interact with this medicine?  medicines to increase blood counts like filgrastim, pegfilgrastim, sargramostim  some other chemotherapy drugs like cisplatin  vaccines Talk to your doctor or health care professional before taking any of these medicines:  acetaminophen  aspirin  ibuprofen  ketoprofen  naproxen This list may not describe all possible interactions. Give your health care provider a list of all the medicines, herbs, non-prescription drugs, or  dietary supplements you use. Also tell them if you smoke, drink alcohol, or use illegal drugs. Some items may interact with your medicine. What should I watch for while using this medicine? Visit your doctor for checks on your progress. This drug may make you feel generally unwell. This is not uncommon, as chemotherapy can affect healthy cells as well as cancer cells. Report any side effects. Continue your course of treatment even though you feel ill unless your doctor tells you to stop. In some cases, you may be given additional medicines to help with side effects. Follow all directions for their use. Call your doctor or health care professional for advice if you get a fever, chills or sore throat, or other symptoms of a cold or flu. Do not treat yourself. This drug decreases your body's ability to fight infections. Try to avoid being around people who are sick. This medicine may increase your risk to bruise or bleed. Call your doctor or health care professional if you notice any unusual bleeding. Be careful brushing and flossing your teeth or using a toothpick because you may get an infection or bleed more easily. If you have any dental work done, tell your dentist you are receiving this medicine. Avoid taking products that contain aspirin, acetaminophen, ibuprofen, naproxen, or ketoprofen unless instructed by your doctor. These medicines may hide a fever. Do not become pregnant while taking this medicine or for 6 months after stopping it. Women should inform their doctor if they wish to become pregnant or think they might be pregnant. Men should not father a child while taking this medicine and for 3 months after stopping it.   There is a potential for serious side effects to an unborn child. Talk to your health care professional or pharmacist for more information. Do not breast-feed an infant while taking this medicine or for at least 1 week after stopping it. Men should inform their doctors if they wish  to father a child. This medicine may lower sperm counts. Talk with your doctor or health care professional if you are concerned about your fertility. What side effects may I notice from receiving this medicine? Side effects that you should report to your doctor or health care professional as soon as possible:  allergic reactions like skin rash, itching or hives, swelling of the face, lips, or tongue  breathing problems  pain, redness, or irritation at site where injected  signs and symptoms of a dangerous change in heartbeat or heart rhythm like chest pain; dizziness; fast or irregular heartbeat; palpitations; feeling faint or lightheaded, falls; breathing problems  signs of decreased platelets or bleeding - bruising, pinpoint red spots on the skin, black, tarry stools, blood in the urine  signs of decreased red blood cells - unusually weak or tired, feeling faint or lightheaded, falls  signs of infection - fever or chills, cough, sore throat, pain or difficulty passing urine  signs and symptoms of kidney injury like trouble passing urine or change in the amount of urine  signs and symptoms of liver injury like dark yellow or brown urine; general ill feeling or flu-like symptoms; light-colored stools; loss of appetite; nausea; right upper belly pain; unusually weak or tired; yellowing of the eyes or skin  swelling of ankles, feet, hands Side effects that usually do not require medical attention (report to your doctor or health care professional if they continue or are bothersome):  constipation  diarrhea  hair loss  loss of appetite  nausea  rash  vomiting This list may not describe all possible side effects. Call your doctor for medical advice about side effects. You may report side effects to FDA at 1-800-FDA-1088. Where should I keep my medicine? This drug is given in a hospital or clinic and will not be stored at home. NOTE: This sheet is a summary. It may not cover all  possible information. If you have questions about this medicine, talk to your doctor, pharmacist, or health care provider.  2021 Elsevier/Gold Standard (2017-06-02 18:06:11)  

## 2020-04-04 NOTE — Interval H&P Note (Signed)
History and Physical Interval Note:  04/04/2020 9:47 AM  Emily Harding  has presented today for surgery, with the diagnosis of Porta Cath Placement   Malignant neoplasm of head pancreas  Covid Jan 11.  The various methods of treatment have been discussed with the patient and family. After consideration of risks, benefits and other options for treatment, the patient has consented to  Procedure(s): PORTA CATH INSERTION (N/A) as a surgical intervention.  The patient's history has been reviewed, patient examined, no change in status, stable for surgery.  I have reviewed the patient's chart and labs.  Questions were answered to the patient's satisfaction.     Leotis Pain

## 2020-04-05 ENCOUNTER — Telehealth: Payer: Self-pay | Admitting: Oncology

## 2020-04-05 ENCOUNTER — Inpatient Hospital Stay: Payer: Medicare Other

## 2020-04-05 NOTE — Telephone Encounter (Signed)
Pt called and stated that her daughter is unable to take her to her appt on 1/18 and she wants to r/s. Offered pt a ride with transport but she declined because she would like her daughter present.

## 2020-04-05 NOTE — Telephone Encounter (Signed)
Pt was contacted and the appt was changed to next Thursday 1/20 and pt aware of times of appts.

## 2020-04-05 NOTE — Telephone Encounter (Signed)
This was for new chemo start on 1/18

## 2020-04-09 ENCOUNTER — Other Ambulatory Visit: Payer: PPO

## 2020-04-09 ENCOUNTER — Ambulatory Visit: Payer: PPO

## 2020-04-09 ENCOUNTER — Inpatient Hospital Stay: Payer: Medicare Other | Admitting: Oncology

## 2020-04-09 ENCOUNTER — Inpatient Hospital Stay: Payer: Medicare Other

## 2020-04-11 ENCOUNTER — Inpatient Hospital Stay: Payer: Medicare Other | Admitting: Oncology

## 2020-04-11 ENCOUNTER — Ambulatory Visit: Payer: Medicare Other

## 2020-04-11 ENCOUNTER — Inpatient Hospital Stay: Payer: Medicare Other

## 2020-04-11 ENCOUNTER — Encounter: Payer: Self-pay | Admitting: Oncology

## 2020-04-11 VITALS — BP 148/57 | HR 66 | Temp 98.7°F | Resp 16 | Ht 63.0 in | Wt 118.2 lb

## 2020-04-11 DIAGNOSIS — Z90710 Acquired absence of both cervix and uterus: Secondary | ICD-10-CM | POA: Diagnosis not present

## 2020-04-11 DIAGNOSIS — C250 Malignant neoplasm of head of pancreas: Secondary | ICD-10-CM

## 2020-04-11 DIAGNOSIS — Z5111 Encounter for antineoplastic chemotherapy: Secondary | ICD-10-CM

## 2020-04-11 DIAGNOSIS — K5900 Constipation, unspecified: Secondary | ICD-10-CM | POA: Diagnosis not present

## 2020-04-11 DIAGNOSIS — I10 Essential (primary) hypertension: Secondary | ICD-10-CM | POA: Diagnosis not present

## 2020-04-11 DIAGNOSIS — Z853 Personal history of malignant neoplasm of breast: Secondary | ICD-10-CM | POA: Diagnosis not present

## 2020-04-11 DIAGNOSIS — Z9011 Acquired absence of right breast and nipple: Secondary | ICD-10-CM | POA: Diagnosis not present

## 2020-04-11 DIAGNOSIS — Z9079 Acquired absence of other genital organ(s): Secondary | ICD-10-CM | POA: Diagnosis not present

## 2020-04-11 DIAGNOSIS — Z79899 Other long term (current) drug therapy: Secondary | ICD-10-CM | POA: Diagnosis not present

## 2020-04-11 DIAGNOSIS — Z7982 Long term (current) use of aspirin: Secondary | ICD-10-CM | POA: Diagnosis not present

## 2020-04-11 DIAGNOSIS — Z9221 Personal history of antineoplastic chemotherapy: Secondary | ICD-10-CM | POA: Diagnosis not present

## 2020-04-11 DIAGNOSIS — F419 Anxiety disorder, unspecified: Secondary | ICD-10-CM | POA: Diagnosis not present

## 2020-04-11 LAB — COMPREHENSIVE METABOLIC PANEL
ALT: 14 U/L (ref 0–44)
AST: 34 U/L (ref 15–41)
Albumin: 4 g/dL (ref 3.5–5.0)
Alkaline Phosphatase: 65 U/L (ref 38–126)
Anion gap: 8 (ref 5–15)
BUN: 16 mg/dL (ref 8–23)
CO2: 28 mmol/L (ref 22–32)
Calcium: 8.9 mg/dL (ref 8.9–10.3)
Chloride: 101 mmol/L (ref 98–111)
Creatinine, Ser: 0.85 mg/dL (ref 0.44–1.00)
GFR, Estimated: >60 mL/min (ref >60)
Glucose, Bld: 115 mg/dL — ABNORMAL HIGH (ref 70–99)
Potassium: 3.7 mmol/L (ref 3.5–5.1)
Sodium: 137 mmol/L (ref 135–145)
Total Bilirubin: 0.6 mg/dL (ref 0.3–1.2)
Total Protein: 6.9 g/dL (ref 6.5–8.1)

## 2020-04-11 LAB — CBC WITH DIFFERENTIAL/PLATELET
Abs Immature Granulocytes: 0.03 10*3/uL (ref 0.00–0.07)
Basophils Absolute: 0 10*3/uL (ref 0.0–0.1)
Basophils Relative: 0 %
Eosinophils Absolute: 0.2 10*3/uL (ref 0.0–0.5)
Eosinophils Relative: 2 %
HCT: 37.2 % (ref 36.0–46.0)
Hemoglobin: 12.3 g/dL (ref 12.0–15.0)
Immature Granulocytes: 0 %
Lymphocytes Relative: 6 %
Lymphs Abs: 0.5 10*3/uL — ABNORMAL LOW (ref 0.7–4.0)
MCH: 29.6 pg (ref 26.0–34.0)
MCHC: 33.1 g/dL (ref 30.0–36.0)
MCV: 89.4 fL (ref 80.0–100.0)
Monocytes Absolute: 0.5 10*3/uL (ref 0.1–1.0)
Monocytes Relative: 5 %
Neutro Abs: 7.7 10*3/uL (ref 1.7–7.7)
Neutrophils Relative %: 87 %
Platelets: 343 10*3/uL (ref 150–400)
RBC: 4.16 MIL/uL (ref 3.87–5.11)
RDW: 13 % (ref 11.5–15.5)
WBC: 8.9 10*3/uL (ref 4.0–10.5)
nRBC: 0 % (ref 0.0–0.2)

## 2020-04-11 MED ORDER — SODIUM CHLORIDE 0.9 % IV SOLN
1000.0000 mg/m2 | Freq: Once | INTRAVENOUS | Status: AC
  Administered 2020-04-11: 1558 mg via INTRAVENOUS
  Filled 2020-04-11: qty 26.3

## 2020-04-11 MED ORDER — HEPARIN SOD (PORK) LOCK FLUSH 100 UNIT/ML IV SOLN
500.0000 [IU] | Freq: Once | INTRAVENOUS | Status: AC | PRN
  Administered 2020-04-11: 500 [IU]
  Filled 2020-04-11: qty 5

## 2020-04-11 MED ORDER — HEPARIN SOD (PORK) LOCK FLUSH 100 UNIT/ML IV SOLN
INTRAVENOUS | Status: AC
  Filled 2020-04-11: qty 5

## 2020-04-11 MED ORDER — SODIUM CHLORIDE 0.9% FLUSH
10.0000 mL | INTRAVENOUS | Status: DC | PRN
  Administered 2020-04-11: 10 mL
  Filled 2020-04-11: qty 10

## 2020-04-11 MED ORDER — PROCHLORPERAZINE MALEATE 10 MG PO TABS
10.0000 mg | ORAL_TABLET | Freq: Once | ORAL | Status: AC
  Administered 2020-04-11: 10 mg via ORAL
  Filled 2020-04-11: qty 1

## 2020-04-11 MED ORDER — SODIUM CHLORIDE 0.9 % IV SOLN
Freq: Once | INTRAVENOUS | Status: AC
  Filled 2020-04-11: qty 250

## 2020-04-11 NOTE — Progress Notes (Signed)
Hematology/Oncology Consult note [facility]  [phone] [phone]  Patient Care Team: Baxter Hire, MD as PCP - General (Internal Medicine)   Name of the patient: Emily Harding  OP:[id number]  03-18-1941   Date of visit: 04/11/20  Diagnosis-pancreatic adenocarcinoma stage Ib T2 N0 M0 s/p Whipple surgery  Chief complaint/ Reason for visit-on treatment assessment prior to cycle 1 day 1 of adjuvant gemcitabine  Heme/Onc history: patient is a 80 year old female with an oncology history as follows: 1.  On 11/22/2019 she presented with left lower quadrant abdominal pain and persistent nausea.  CT abdomen and pelvis showed a 3.1 x 2.3 cm mass in the head of the pancreas with diffuse pancreatic ductal dilatation.  CA 19-9 elevated at 166 2.  EUS showed 2.0 x 1.9 cm mass in the head of pancreas with no vessel involvement.  UT2N0.  Pathology showed adenocarcinoma 3.  CT chest showed 2 to 3 mm lung nodules with no evidence of distant metastatic disease.  In October 2021 patient underwent Whipple resection with pathology demonstrating 3.1 cm adenocarcinoma with mucinous features.  Uncinate margin positive for tumor.  Tumor approaches less than 1 mm within cautery.  Positive for PNI.  0 out of 18 lymph nodes positive for malignancy. 4.  Patient evaluated by medical oncology at [facility] and was recommended adjuvant chemotherapy followed by chemoradiation  Interval history-reports ongoing fatigue.  She is still living alone and is independent of her ADLs.  Since her Whipple surgery she has been trying to find a diet that would suit her stomach and make it easier for her to digest.  She is anxious about starting chemotherapy today  ECOG PS- 1 Pain scale- 0 Opioid associated constipation- no  Review of systems- Review of Systems  Constitutional: Positive for malaise/fatigue. Negative for chills, fever and weight loss.  HENT: Negative for congestion, ear  discharge and nosebleeds.   Eyes: Negative for blurred vision.  Respiratory: Negative for cough, hemoptysis, sputum production, shortness of breath and wheezing.   Cardiovascular: Negative for chest pain, palpitations, orthopnea and claudication.  Gastrointestinal: Negative for abdominal pain, blood in stool, constipation, diarrhea, heartburn, melena, nausea and vomiting.  Genitourinary: Negative for dysuria, flank pain, frequency, hematuria and urgency.  Musculoskeletal: Negative for back pain, joint pain and myalgias.  Skin: Negative for rash.  Neurological: Negative for dizziness, tingling, focal weakness, seizures, weakness and headaches.  Endo/Heme/Allergies: Does not bruise/bleed easily.  Psychiatric/Behavioral: Negative for depression and suicidal ideas. The patient does not have insomnia.       Allergies  Allergen Reactions  . Hydromorphone Hcl Itching  . Azithromycin   . Codeine Sulfate Other (See Comments)  . Doxycycline Calcium Other (See Comments)  . Duloxetine Nausea Only  . Erythromycin Ethylsuccinate Other (See Comments)  . Macrolides And Ketolides Other (See Comments)  . Nitrofurantoin     Other reaction(s): Unknown Other reaction(s): Abdominal Pain Other reaction(s): Unknown  . Sulfa Antibiotics      Past Medical History:  Diagnosis Date  . Anxiety   . Arthritis   . Breast cancer ([place]) 1993   RT MASTECTOMY  . Heartburn   . HTN (hypertension)   . Pancreatic cancer ([place])   . Personal history of chemotherapy   . S/P chemotherapy, time since greater than 12 weeks 1993   BREAST CA     Past Surgical History:  Procedure Laterality Date  . ABDOMINAL HYSTERECTOMY    . colon polyp removal    .  GALLBLADDER SURGERY    . LAPAROSCOPIC OOPHERECTOMY  1978  . MASTECTOMY Right 1993   BREAST CA  . PANCREAS SURGERY N/A 01/04/2020   Per patient removed head of pancreas due to cancer  . PORTA CATH INSERTION N/A 04/04/2020   Procedure: PORTA CATH INSERTION;  Surgeon:  Algernon Huxley, MD;  Location: [place] [facility];  Service: Cardiovascular;  Laterality: N/A;  . removal of fallopian tubes  1989    Social History   Socioeconomic History  . Marital status: Widowed    Spouse name: Not on file  . Number of children: Not on file  . Years of education: Not on file  . Highest education level: Not on file  Occupational History  . Not on file  Tobacco Use  . Smoking status: Never Smoker  . Smokeless tobacco: Never Used  Vaping Use  . Vaping Use: Never used  Substance and Sexual Activity  . Alcohol use: No  . Drug use: No  . Sexual activity: Not Currently  Other Topics Concern  . Not on file  Social History Narrative  . Not on file   Social Determinants of Health   Financial Resource Strain: Not on file  Food Insecurity: Not on file  Transportation Needs: Not on file  Physical Activity: Not on file  Stress: Not on file  Social Connections: Not on file  Intimate Partner Violence: Not on file    Family History  Problem Relation Age of Onset  . Breast cancer Neg Hx   . Kidney cancer Neg Hx   . Bladder Cancer Neg Hx   . Prostate cancer Neg Hx      Current Outpatient Medications:  .  aspirin EC 81 MG tablet, Take 81 mg by mouth daily., Disp: , Rfl:  .  atenolol (TENORMIN) 50 MG tablet, Take 50 mg by mouth 2 (two) times daily., Disp: , Rfl:  .  celecoxib (CELEBREX) 200 MG capsule, Take 200 mg by mouth daily., Disp: , Rfl:  .  clorazepate (TRANXENE) 3.75 MG tablet, Take 3.75 mg by mouth daily as needed., Disp: , Rfl:  .  gabapentin (NEURONTIN) 400 MG capsule, Take 400 mg by mouth 3 (three) times daily., Disp: , Rfl:  .  lidocaine-prilocaine (EMLA) cream, Apply small amount of cream over port site 1 1/2 hours before each treatment, place saran wrap over cream to protect clothin, Disp: 30 g, Rfl: 3 .  losartan (COZAAR) 50 MG tablet, Take 100 mg by mouth daily., Disp: , Rfl:  .  metoCLOPramide (REGLAN) 5 MG tablet, Take 5 mg by mouth 3  (three) times daily before meals., Disp: , Rfl:  .  Multiple Vitamin (MULTI-VITAMINS) TABS, Take 1 tablet by mouth daily., Disp: , Rfl:  .  Pancrelipase, Lip-Prot-Amyl, 40000-126000 units CPEP, Take by mouth., Disp: , Rfl:  .  traZODone (DESYREL) 100 MG tablet, Take 100 mg by mouth at bedtime., Disp: , Rfl:  .  cyclobenzaprine (FLEXERIL) 10 MG tablet, TAKE 1 TABLET (10 MG TOTAL) BY MOUTH 3 (THREE) TIMES DAILY AS NEEDED FOR MUSCLE SPASMS. (Patient not taking: Reported on 04/11/2020), Disp: , Rfl:  .  LORazepam (ATIVAN) 0.5 MG tablet, Take 1 tablet (0.5 mg total) by mouth every 6 (six) hours as needed (Nausea or vomiting). (Patient not taking: Reported on 04/11/2020), Disp: 30 tablet, Rfl: 0 .  ondansetron (ZOFRAN) 8 MG tablet, Take 1 tablet (8 mg total) by mouth 2 (two) times daily as needed (Nausea or vomiting). (Patient not taking: Reported  on 04/11/2020), Disp: 30 tablet, Rfl: 1 .  prochlorperazine (COMPAZINE) 10 MG tablet, Take 1 tablet (10 mg total) by mouth every 6 (six) hours as needed (Nausea or vomiting). (Patient not taking: Reported on 04/11/2020), Disp: 30 tablet, Rfl: 1  Physical exam:  Vitals:   04/11/20 1039  BP: (!) 148/57  Pulse: 66  Resp: 16  Temp: 98.7 F (37.1 C)  TempSrc: Oral  Weight: 118 lb 3.2 oz (53.6 kg)  Height: 5\' 3"  (1.6 m)   Physical Exam Constitutional:      Comments: Thin elderly woman in no acute distress  Eyes:     Extraocular Movements: EOM normal.  Cardiovascular:     Rate and Rhythm: Normal rate and regular rhythm.     Heart sounds: Normal heart sounds.  Pulmonary:     Effort: Pulmonary effort is normal.     Breath sounds: Normal breath sounds.  Abdominal:     General: Bowel sounds are normal.     Palpations: Abdomen is soft.  Skin:    General: Skin is warm and dry.  Neurological:     Mental Status: She is alert and oriented to person, place, and time.      CMP Latest Ref Rng & Units 04/11/2020  Glucose 70 - 99 mg/dL 115(H)  BUN 8 - 23 mg/dL  16  Creatinine 0.44 - 1.00 mg/dL 0.85  Sodium 135 - 145 mmol/L 137  Potassium 3.5 - 5.1 mmol/L 3.7  Chloride 98 - 111 mmol/L 101  CO2 22 - 32 mmol/L 28  Calcium 8.9 - 10.3 mg/dL 8.9  Total Protein 6.5 - 8.1 g/dL 6.9  Total Bilirubin 0.3 - 1.2 mg/dL 0.6  Alkaline Phos 38 - 126 U/L 65  AST 15 - 41 U/L 34  ALT 0 - 44 U/L 14   CBC Latest Ref Rng & Units 04/11/2020  WBC 4.0 - 10.5 K/uL 8.9  Hemoglobin 12.0 - 15.0 g/dL 12.3  Hematocrit 36.0 - 46.0 % 37.2  Platelets 150 - 400 K/uL 343    No images are attached to the encounter.  PERIPHERAL VASCULAR CATHETERIZATION  Result Date: 04/04/2020 See op note    Assessment and plan- Patient is a 80 y.o. female with pancreatic adenocarcinoma stage IB T2 N0 M0 s/p Whipple surgery.  She is here for on treatment assessment prior to cycle 1 of adjuvant gemcitabine chemotherapy  Counseled to proceed with cycle 1 of gemcitabine chemotherapy today at 1000 mg per metered square.  I will see her back in 1 week's time for cycle 2.  Plan is to do it 2 weeks on 1 week off for 6 months.  We will plan to repeat CT chest abdomen and pelvis after 3 months.  Discussed risks and benefits of chemotherapy including all but not limited to nausea, vomiting, low blood counts and possible risk of infections.  Overall gemcitabine single agent is well-tolerated at her age.  I did get in touch with Jennet Maduro to touch base with her about dietary recommendations post Whipple surgery    Visit Diagnosis 1. Encounter for antineoplastic chemotherapy   2. Malignant neoplasm of head of pancreas [facility])      Dr. Randa Evens, MD, MPH [facility] [phone] 04/11/2020 11:02 AM

## 2020-04-11 NOTE — Progress Notes (Signed)
Nutrition Assessment:  Acute add on today  Referral from Dr Janese Banks  80 year old female with pancreatic cancer.  S/p whipple in 01/2020 at [facility].  Patient to start adjuvant chemotherapy today. Past medical history of HTN, HLD, breast cancer, GERD, dysphagia.   Met with patient during infusion.  Patient anxious about first treatment as previous chemo caused nausea and vomiting.  Reports that since surgery she can't eat very many foods.  Reports typically has egg and toast for breakfast. Lunch and dinner are the same typically chicken with green beans, macaroni and cheese or mashed potatoes.  Sometimes will eat ice cream.  Does not like ensure/boost shakes. Reports she throw up after drinking them one time.      Medications: compazine, pancreatic enzymes, MVI, reglan, zofran, ativan  Labs: reviewed  Anthropometrics:   Height: 63 inches Weight: 118 lb 1/20 121 lb 03/21/2020 BMI: 20  Estimated Energy Needs  Kcals: 1600-1800 Protein: 80-90 g Fluid: 1.6 L  NUTRITION DIAGNOSIS: Inadequate oral intake related to cancer and cancer related treatment as evidenced by weight loss and poor appetite   INTERVENTION:  Discussed foods to try following Whipple procedure. Handout provided to offer more variety to patient's diet.   Reviewed proper way to take pancreatic enzymes. Offered other samples of shakes for patient to try.  She declined at this time.  Contact information given to patient.    MONITORING, EVALUATION, GOAL: weight trends, intake   NEXT VISIT: Feb 10, phone call  Emily Harding, [place], [place] Registered Dietitian [phone] (mobile)

## 2020-04-11 NOTE — Progress Notes (Signed)
Pt doing well. Has tailbone pain but she went to see md and they feel it Is arthritis. She is nervous about chemo today because she had chemo a long time ago and she got nauseated and vomiting at home the evening of the first treatment.

## 2020-04-12 ENCOUNTER — Telehealth: Payer: Self-pay

## 2020-04-12 LAB — CANCER ANTIGEN 19-9: CA 19-9: 10 U/mL (ref 0–35)

## 2020-04-12 NOTE — Telephone Encounter (Signed)
Telephone call to patient for follow up after receiving first infusion.   Patient states infusion went great.  States eating good and drinking plenty of fluids.   Denies any nausea or vomiting.  Encouraged patient to call for any concerns or questions. 

## 2020-04-15 ENCOUNTER — Inpatient Hospital Stay (HOSPITAL_BASED_OUTPATIENT_CLINIC_OR_DEPARTMENT_OTHER): Payer: Medicare Other | Admitting: Nurse Practitioner

## 2020-04-15 ENCOUNTER — Inpatient Hospital Stay: Payer: Medicare Other | Admitting: Nurse Practitioner

## 2020-04-15 ENCOUNTER — Telehealth: Payer: Self-pay | Admitting: *Deleted

## 2020-04-15 DIAGNOSIS — C250 Malignant neoplasm of head of pancreas: Secondary | ICD-10-CM | POA: Diagnosis not present

## 2020-04-15 DIAGNOSIS — R197 Diarrhea, unspecified: Secondary | ICD-10-CM | POA: Diagnosis not present

## 2020-04-15 DIAGNOSIS — Z9221 Personal history of antineoplastic chemotherapy: Secondary | ICD-10-CM

## 2020-04-15 MED ORDER — DIPHENOXYLATE-ATROPINE 2.5-0.025 MG PO TABS: 1.0000 | ORAL_TABLET | Freq: Four times a day (QID) | ORAL | 0 refills | Status: DC | PRN

## 2020-04-15 NOTE — Progress Notes (Signed)
Virtual Visit Progress Note  Symptom Management Clinic [facility]  [phone] Fax:([phone]) [phone]  I connected with Emily Harding on 04/15/20 at 10:30 AM EST by telephone visit and verified that I am speaking with the correct person using two identifiers.   I discussed the limitations, risks, security and privacy concerns of performing an evaluation and management service by telemedicine and the availability of in-person appointments. I also discussed with the patient that there may be a patient responsible charge related to this service. The patient expressed understanding and agreed to proceed.   Other persons participating in the visit and their role in the encounter: none  Patient's location: home Provider's location: home  Chief Complaint: diarrhea    Patient Care Team: Baxter Hire, MD as PCP - General (Internal Medicine)   Name of the patient: Emily Harding  OP:[id number]  07-17-1940   Date of visit: 04/15/20  Diagnosis- pancreatic cancer  Chief complaint/ Reason for visit- constipation and diarrhea  Heme/Onc history:  Oncology History  Malignant neoplasm of head of pancreas ([place])  03/21/2020 Initial Diagnosis   Malignant neoplasm of head of pancreas ([place])   03/21/2020 Cancer Staging   Staging form: Exocrine Pancreas, AJCC 8th Edition - Pathologic stage from 03/21/2020: Stage IB (pT2, pN0, cM0) - Signed by Sindy Guadeloupe, MD on 03/21/2020   04/11/2020 -  Chemotherapy    Patient is on Treatment Plan: PANCREAS GEMCITABINE D1,8,15 Q28D X 1 CYCLE        Interval history- Patient is 80 year old female diagnosed with pancreatic cancer, currently on gemcitabine who presents to [facility] for complaints of diarrhea.   Saturday pain in side; thought was constipation. Took miralax, 3 times. Worried about becoming constipated. Yesterday started diarrhea. Took Imodium and thought it was better. Diarrhea started again last night. Diarrhea  every 2 hours since then. Took more imodium this morning. Diarrhea is chunky, floating. Brownish yellow in color. Abdominal pain has resolved. No nausea or vomiting. No fevers or chills.   Review of systems- Review of Systems  Constitutional: Negative for chills, fever, malaise/fatigue and weight loss.  HENT: Negative for hearing loss, nosebleeds, sore throat and tinnitus.   Eyes: Negative for blurred vision and double vision.  Respiratory: Negative for cough, hemoptysis, shortness of breath and wheezing.   Cardiovascular: Negative for chest pain, palpitations and leg swelling.  Gastrointestinal: Positive for constipation and diarrhea. Negative for abdominal pain, blood in stool, melena, nausea and vomiting.  Genitourinary: Negative for dysuria and urgency.  Musculoskeletal: Negative for back pain, falls, joint pain and myalgias.  Skin: Negative for itching and rash.  Neurological: Negative for dizziness, tingling, sensory change, loss of consciousness, weakness and headaches.  Endo/Heme/Allergies: Negative for environmental allergies. Does not bruise/bleed easily.  Psychiatric/Behavioral: Negative for depression. The patient is nervous/anxious. The patient does not have insomnia.      Allergies  Allergen Reactions  . Hydromorphone Hcl Itching  . Azithromycin   . Codeine Sulfate Other (See Comments)  . Doxycycline Calcium Other (See Comments)  . Duloxetine Nausea Only  . Erythromycin Ethylsuccinate Other (See Comments)  . Macrolides And Ketolides Other (See Comments)  . Nitrofurantoin     Other reaction(s): Unknown Other reaction(s): Abdominal Pain Other reaction(s): Unknown  . Sulfa Antibiotics     Past Medical History:  Diagnosis Date  . Anxiety   . Arthritis   . Breast cancer ([place]) 1993   RT MASTECTOMY  . Heartburn   . HTN (hypertension)   .  Pancreatic cancer ([place])   . Personal history of chemotherapy   . S/P chemotherapy, time since greater than 12 weeks 1993   BREAST  CA    Past Surgical History:  Procedure Laterality Date  . ABDOMINAL HYSTERECTOMY    . colon polyp removal    . GALLBLADDER SURGERY    . LAPAROSCOPIC OOPHERECTOMY  1978  . MASTECTOMY Right 1993   BREAST CA  . PANCREAS SURGERY N/A 01/04/2020   Per patient removed head of pancreas due to cancer  . PORTA CATH INSERTION N/A 04/04/2020   Procedure: PORTA CATH INSERTION;  Surgeon: Algernon Huxley, MD;  Location: [place] [facility];  Service: Cardiovascular;  Laterality: N/A;  . removal of fallopian tubes  1989    Social History   Socioeconomic History  . Marital status: Widowed    Spouse name: Not on file  . Number of children: Not on file  . Years of education: Not on file  . Highest education level: Not on file  Occupational History  . Not on file  Tobacco Use  . Smoking status: Never Smoker  . Smokeless tobacco: Never Used  Vaping Use  . Vaping Use: Never used  Substance and Sexual Activity  . Alcohol use: No  . Drug use: No  . Sexual activity: Not Currently  Other Topics Concern  . Not on file  Social History Narrative  . Not on file   Social Determinants of Health   Financial Resource Strain: Not on file  Food Insecurity: Not on file  Transportation Needs: Not on file  Physical Activity: Not on file  Stress: Not on file  Social Connections: Not on file  Intimate Partner Violence: Not on file    Family History  Problem Relation Age of Onset  . Breast cancer Neg Hx   . Kidney cancer Neg Hx   . Bladder Cancer Neg Hx   . Prostate cancer Neg Hx      Current Outpatient Medications:  .  aspirin EC 81 MG tablet, Take 81 mg by mouth daily., Disp: , Rfl:  .  atenolol (TENORMIN) 50 MG tablet, Take 50 mg by mouth 2 (two) times daily., Disp: , Rfl:  .  celecoxib (CELEBREX) 200 MG capsule, Take 200 mg by mouth daily., Disp: , Rfl:  .  clorazepate (TRANXENE) 3.75 MG tablet, Take 3.75 mg by mouth daily as needed., Disp: , Rfl:  .  cyclobenzaprine (FLEXERIL) 10 MG  tablet, TAKE 1 TABLET (10 MG TOTAL) BY MOUTH 3 (THREE) TIMES DAILY AS NEEDED FOR MUSCLE SPASMS. (Patient not taking: Reported on 04/11/2020), Disp: , Rfl:  .  gabapentin (NEURONTIN) 400 MG capsule, Take 400 mg by mouth 3 (three) times daily., Disp: , Rfl:  .  lidocaine-prilocaine (EMLA) cream, Apply small amount of cream over port site 1 1/2 hours before each treatment, place saran wrap over cream to protect clothin, Disp: 30 g, Rfl: 3 .  LORazepam (ATIVAN) 0.5 MG tablet, Take 1 tablet (0.5 mg total) by mouth every 6 (six) hours as needed (Nausea or vomiting). (Patient not taking: Reported on 04/11/2020), Disp: 30 tablet, Rfl: 0 .  losartan (COZAAR) 50 MG tablet, Take 100 mg by mouth daily., Disp: , Rfl:  .  metoCLOPramide (REGLAN) 5 MG tablet, Take 5 mg by mouth 3 (three) times daily before meals., Disp: , Rfl:  .  Multiple Vitamin (MULTI-VITAMINS) TABS, Take 1 tablet by mouth daily., Disp: , Rfl:  .  ondansetron (ZOFRAN) 8 MG tablet, Take 1  tablet (8 mg total) by mouth 2 (two) times daily as needed (Nausea or vomiting). (Patient not taking: Reported on 04/11/2020), Disp: 30 tablet, Rfl: 1 .  Pancrelipase, Lip-Prot-Amyl, 40000-126000 units CPEP, Take by mouth., Disp: , Rfl:  .  prochlorperazine (COMPAZINE) 10 MG tablet, Take 1 tablet (10 mg total) by mouth every 6 (six) hours as needed (Nausea or vomiting). (Patient not taking: Reported on 04/11/2020), Disp: 30 tablet, Rfl: 1 .  traZODone (DESYREL) 100 MG tablet, Take 100 mg by mouth at bedtime., Disp: , Rfl:   Physical exam: Exam limited due to telemedicine Physical Exam Pulmonary:     Comments: No obvious respiratory distress Neurological:     Mental Status: She is alert and oriented to person, place, and time.  Psychiatric:        Mood and Affect: Mood is anxious.        Speech: Speech normal.        Cognition and Memory: Cognition normal.     CMP Latest Ref Rng & Units 04/11/2020  Glucose 70 - 99 mg/dL 115(H)  BUN 8 - 23 mg/dL 16   Creatinine 0.44 - 1.00 mg/dL 0.85  Sodium 135 - 145 mmol/L 137  Potassium 3.5 - 5.1 mmol/L 3.7  Chloride 98 - 111 mmol/L 101  CO2 22 - 32 mmol/L 28  Calcium 8.9 - 10.3 mg/dL 8.9  Total Protein 6.5 - 8.1 g/dL 6.9  Total Bilirubin 0.3 - 1.2 mg/dL 0.6  Alkaline Phos 38 - 126 U/L 65  AST 15 - 41 U/L 34  ALT 0 - 44 U/L 14   CBC Latest Ref Rng & Units 04/11/2020  WBC 4.0 - 10.5 K/uL 8.9  Hemoglobin 12.0 - 15.0 g/dL 12.3  Hematocrit 36.0 - 46.0 % 37.2  Platelets 150 - 400 K/uL 343    No images are attached to the encounter.  PERIPHERAL VASCULAR CATHETERIZATION  Result Date: 04/04/2020 See op note   Assessment and plan- Patient is a 80 y.o. female diagnosed with pancreatic cancer currently status post cycle one of gemcitabine chemotherapy who presents to symptom management clinic for complaints of constipation and diarrhea.  Constipation is resolved post MiraLAX.  Unclear if diarrhea related to MiraLAX or chemotherapy.  Prescription sent to pharmacy for Lomotil however due to transportation issues will maximize Imodium up to 16 mg/24 hour period. Hold miralax when diarrhea occurs. Diet reviewed. Encouraged hydration.   Follow-up with Dr. Janese Banks as scheduled or return to clinic sooner if symptoms do not improve or worsen.    Visit Diagnosis 1. Diarrhea, unspecified type     Patient expressed understanding and was in agreement with this plan. She also understands that She can call clinic at any time with any questions, concerns, or complaints.   I discussed the assessment and treatment plan with the patient. The patient was provided an opportunity to ask questions and all were answered. The patient agreed with the plan and demonstrated an understanding of the instructions.   The patient was advised to call back or seek an in-person evaluation if the symptoms worsen or if the condition fails to improve as anticipated.   I provided 15 minutes of non face-to-face telephone visit time during  this encounter, and > 50% was spent counseling as documented under my assessment & plan.  Thank you for allowing me to participate in the care of this very pleasant patient.   Beckey Rutter, DNP, AGNP-[facility] [place]: Dr. Janese Banks

## 2020-04-15 NOTE — Telephone Encounter (Signed)
Patient called to state that she is unclear about her telephone visit today. She is still having multiple loose stools and would like to speak with the nurse or MD.

## 2020-04-15 NOTE — Telephone Encounter (Signed)
Sonia Baller will call her

## 2020-04-18 ENCOUNTER — Ambulatory Visit: Payer: Medicare Other

## 2020-04-18 ENCOUNTER — Ambulatory Visit: Payer: Medicare Other | Admitting: Oncology

## 2020-04-18 ENCOUNTER — Other Ambulatory Visit: Payer: Medicare Other

## 2020-04-19 ENCOUNTER — Inpatient Hospital Stay: Payer: Medicare Other

## 2020-04-19 ENCOUNTER — Inpatient Hospital Stay: Payer: Medicare Other | Admitting: Oncology

## 2020-04-19 ENCOUNTER — Encounter: Payer: Self-pay | Admitting: Oncology

## 2020-04-19 ENCOUNTER — Other Ambulatory Visit: Payer: Self-pay

## 2020-04-19 VITALS — BP 176/63 | HR 74 | Temp 97.5°F | Wt 118.5 lb

## 2020-04-19 DIAGNOSIS — Z5111 Encounter for antineoplastic chemotherapy: Secondary | ICD-10-CM | POA: Diagnosis not present

## 2020-04-19 DIAGNOSIS — C250 Malignant neoplasm of head of pancreas: Secondary | ICD-10-CM

## 2020-04-19 LAB — COMPREHENSIVE METABOLIC PANEL
ALT: 15 U/L (ref 0–44)
AST: 25 U/L (ref 15–41)
Albumin: 3.7 g/dL (ref 3.5–5.0)
Alkaline Phosphatase: 61 U/L (ref 38–126)
Anion gap: 11 (ref 5–15)
BUN: 14 mg/dL (ref 8–23)
CO2: 25 mmol/L (ref 22–32)
Calcium: 8.9 mg/dL (ref 8.9–10.3)
Chloride: 99 mmol/L (ref 98–111)
Creatinine, Ser: 0.73 mg/dL (ref 0.44–1.00)
GFR, Estimated: >60 mL/min (ref >60)
Glucose, Bld: 118 mg/dL — ABNORMAL HIGH (ref 70–99)
Potassium: 3.6 mmol/L (ref 3.5–5.1)
Sodium: 135 mmol/L (ref 135–145)
Total Bilirubin: 0.7 mg/dL (ref 0.3–1.2)
Total Protein: 6.6 g/dL (ref 6.5–8.1)

## 2020-04-19 LAB — CBC WITH DIFFERENTIAL/PLATELET
Abs Immature Granulocytes: 0.01 10*3/uL (ref 0.00–0.07)
Basophils Absolute: 0 10*3/uL (ref 0.0–0.1)
Basophils Relative: 1 %
Eosinophils Absolute: 0.1 10*3/uL (ref 0.0–0.5)
Eosinophils Relative: 2 %
HCT: 34.3 % — ABNORMAL LOW (ref 36.0–46.0)
Hemoglobin: 11.5 g/dL — ABNORMAL LOW (ref 12.0–15.0)
Immature Granulocytes: 0 %
Lymphocytes Relative: 14 %
Lymphs Abs: 0.6 10*3/uL — ABNORMAL LOW (ref 0.7–4.0)
MCH: 29.6 pg (ref 26.0–34.0)
MCHC: 33.5 g/dL (ref 30.0–36.0)
MCV: 88.4 fL (ref 80.0–100.0)
Monocytes Absolute: 0.4 10*3/uL (ref 0.1–1.0)
Monocytes Relative: 8 %
Neutro Abs: 3.5 10*3/uL (ref 1.7–7.7)
Neutrophils Relative %: 75 %
Platelets: 215 10*3/uL (ref 150–400)
RBC: 3.88 MIL/uL (ref 3.87–5.11)
RDW: 12.8 % (ref 11.5–15.5)
WBC: 4.6 10*3/uL (ref 4.0–10.5)
nRBC: 0 % (ref 0.0–0.2)

## 2020-04-19 MED ORDER — SODIUM CHLORIDE 0.9 % IV SOLN
Freq: Once | INTRAVENOUS | Status: AC
  Filled 2020-04-19: qty 250

## 2020-04-19 MED ORDER — HEPARIN SOD (PORK) LOCK FLUSH 100 UNIT/ML IV SOLN
INTRAVENOUS | Status: AC
  Filled 2020-04-19: qty 5

## 2020-04-19 MED ORDER — PROCHLORPERAZINE MALEATE 10 MG PO TABS
10.0000 mg | ORAL_TABLET | Freq: Once | ORAL | Status: AC
Start: 2020-04-19 — End: 2020-04-19
  Administered 2020-04-19: 10 mg via ORAL
  Filled 2020-04-19: qty 1

## 2020-04-19 MED ORDER — HEPARIN SOD (PORK) LOCK FLUSH 100 UNIT/ML IV SOLN
500.0000 [IU] | Freq: Once | INTRAVENOUS | Status: AC | PRN
  Administered 2020-04-19: 500 [IU]
  Filled 2020-04-19: qty 5

## 2020-04-19 MED ORDER — SODIUM CHLORIDE 0.9 % IV SOLN
1000.0000 mg/m2 | Freq: Once | INTRAVENOUS | Status: AC
  Administered 2020-04-19: 1558 mg via INTRAVENOUS
  Filled 2020-04-19: qty 26.3

## 2020-04-19 NOTE — Progress Notes (Signed)
Pt states that she had some diarrhea on sun and mon. She used imodium and last 2 days she is having normal BM. She is drinking and wating good. She has been complaining about like a knot on her sacral bone and Dr. Janese Banks told her it was probably because of her wt loss. She has long time back pain and it sometimes makes her legs hurt and she is rating it 5 out 10 and she takes tylenol for it.

## 2020-04-19 NOTE — Progress Notes (Signed)
Hematology/Oncology Consult note [facility]  Telephone:([phone] Fax:([phone]  Patient Care Team: Baxter Hire, MD as PCP - General (Internal Medicine)   Name of the patient: Emily Harding  [id number]  [birth year]   Date of visit: 04/19/20  Diagnosis- pancreatic adenocarcinoma stage Ib T2 N0 M0 s/p Whipple surgery  Chief complaint/ Reason for visit-on treatment assessment prior to cycle 1 day 8 of adjuvant gemcitabine  Heme/Onc history: patient is a 80 year old female with an oncology history as follows: 1.On 11/22/2019 she presented with left lower quadrant abdominal pain and persistent nausea. CT abdomen and pelvis showed a 3.1 x 2.3 cm mass in the head of the pancreas with diffuse pancreatic ductal dilatation.CA 19-9 elevated at 166 2.EUS showed 2.0 x 1.9 cm mass in the head of pancreas with no vessel involvement. UT2N0. Pathology showed adenocarcinoma 3.CT chest showed 2 to 3 mm lung nodules with no evidence of distant metastatic disease. In October 2021 patient underwent Whipple resection with pathology demonstrating 3.1 cm adenocarcinoma with mucinous features. Uncinate margin positive for tumor. Tumor approaches less than 1 mm within cautery. Positive for PNI. 0 out of 18 lymph nodes positive for malignancy. 4.Patient evaluated by medical oncology at [facility] and was recommended adjuvant chemotherapy followed by chemoradiation  Multiple adjuvant regimens including gem Abraxane, gemcitabine and Xeloda and gemcitabine single agent discussed with patient and patient decided to proceed with single agent gemcitabine starting 04/11/2020  Interval history-patient did have a few episodes of loose stools but not frank watery diarrhea.  She did use some as needed Imodium and her symptoms resolved.  ECOG PS- 1 Pain scale- 0   Review of systems- Review of Systems  Constitutional: Negative for chills, fever, malaise/fatigue and weight loss.   HENT: Negative for congestion, ear discharge and nosebleeds.   Eyes: Negative for blurred vision.  Respiratory: Negative for cough, hemoptysis, sputum production, shortness of breath and wheezing.   Cardiovascular: Negative for chest pain, palpitations, orthopnea and claudication.  Gastrointestinal: Negative for abdominal pain, blood in stool, constipation, diarrhea, heartburn, melena, nausea and vomiting.  Genitourinary: Negative for dysuria, flank pain, frequency, hematuria and urgency.  Musculoskeletal: Negative for back pain, joint pain and myalgias.  Skin: Negative for rash.  Neurological: Negative for dizziness, tingling, focal weakness, seizures, weakness and headaches.  Endo/Heme/Allergies: Does not bruise/bleed easily.  Psychiatric/Behavioral: Negative for depression and suicidal ideas. The patient does not have insomnia.        Allergies  Allergen Reactions  . Hydromorphone Hcl Itching  . Azithromycin   . Codeine Sulfate Other (See Comments)  . Doxycycline Calcium Other (See Comments)  . Duloxetine Nausea Only  . Erythromycin Ethylsuccinate Other (See Comments)  . Macrolides And Ketolides Other (See Comments)  . Nitrofurantoin     Other reaction(s): Unknown Other reaction(s): Abdominal Pain Other reaction(s): Unknown  . Sulfa Antibiotics      Past Medical History:  Diagnosis Date  . Anxiety   . Arthritis   . Breast cancer ([place]) 1993   RT MASTECTOMY  . Heartburn   . HTN (hypertension)   . Pancreatic cancer ([place])   . Personal history of chemotherapy   . S/P chemotherapy, time since greater than 12 weeks 1993   BREAST CA     Past Surgical History:  Procedure Laterality Date  . ABDOMINAL HYSTERECTOMY    . colon polyp removal    . GALLBLADDER SURGERY    . LAPAROSCOPIC OOPHERECTOMY  1978  . MASTECTOMY Right 1993  BREAST CA  . PANCREAS SURGERY N/A 01/04/2020   Per patient removed head of pancreas due to cancer  . PORTA CATH INSERTION N/A 04/04/2020    Procedure: PORTA CATH INSERTION;  Surgeon: Algernon Huxley, MD;  Location: [place] [facility];  Service: Cardiovascular;  Laterality: N/A;  . removal of fallopian tubes  1989    Social History   Socioeconomic History  . Marital status: Widowed    Spouse name: Not on file  . Number of children: Not on file  . Years of education: Not on file  . Highest education level: Not on file  Occupational History  . Not on file  Tobacco Use  . Smoking status: Never Smoker  . Smokeless tobacco: Never Used  Vaping Use  . Vaping Use: Never used  Substance and Sexual Activity  . Alcohol use: No  . Drug use: No  . Sexual activity: Not Currently  Other Topics Concern  . Not on file  Social History Narrative  . Not on file   Social Determinants of Health   Financial Resource Strain: Not on file  Food Insecurity: Not on file  Transportation Needs: Not on file  Physical Activity: Not on file  Stress: Not on file  Social Connections: Not on file  Intimate Partner Violence: Not on file    Family History  Problem Relation Age of Onset  . Breast cancer Neg Hx   . Kidney cancer Neg Hx   . Bladder Cancer Neg Hx   . Prostate cancer Neg Hx      Current Outpatient Medications:  .  aspirin EC 81 MG tablet, Take 81 mg by mouth daily., Disp: , Rfl:  .  atenolol (TENORMIN) 50 MG tablet, Take 50 mg by mouth 2 (two) times daily., Disp: , Rfl:  .  celecoxib (CELEBREX) 200 MG capsule, Take 200 mg by mouth daily., Disp: , Rfl:  .  clorazepate (TRANXENE) 3.75 MG tablet, Take 3.75 mg by mouth daily as needed., Disp: , Rfl:  .  cyclobenzaprine (FLEXERIL) 10 MG tablet, TAKE 1 TABLET (10 MG TOTAL) BY MOUTH 3 (THREE) TIMES DAILY AS NEEDED FOR MUSCLE SPASMS. (Patient not taking: Reported on 04/11/2020), Disp: , Rfl:  .  diphenoxylate-atropine (LOMOTIL) 2.5-0.025 MG tablet, Take 1 tablet by mouth 4 (four) times daily as needed for diarrhea or loose stools., Disp: 30 tablet, Rfl: 0 .  gabapentin (NEURONTIN) 400  MG capsule, Take 400 mg by mouth 3 (three) times daily., Disp: , Rfl:  .  lidocaine-prilocaine (EMLA) cream, Apply small amount of cream over port site 1 1/2 hours before each treatment, place saran wrap over cream to protect clothin, Disp: 30 g, Rfl: 3 .  LORazepam (ATIVAN) 0.5 MG tablet, Take 1 tablet (0.5 mg total) by mouth every 6 (six) hours as needed (Nausea or vomiting). (Patient not taking: Reported on 04/11/2020), Disp: 30 tablet, Rfl: 0 .  losartan (COZAAR) 50 MG tablet, Take 100 mg by mouth daily., Disp: , Rfl:  .  metoCLOPramide (REGLAN) 5 MG tablet, Take 5 mg by mouth 3 (three) times daily before meals., Disp: , Rfl:  .  Multiple Vitamin (MULTI-VITAMINS) TABS, Take 1 tablet by mouth daily., Disp: , Rfl:  .  ondansetron (ZOFRAN) 8 MG tablet, Take 1 tablet (8 mg total) by mouth 2 (two) times daily as needed (Nausea or vomiting). (Patient not taking: Reported on 04/11/2020), Disp: 30 tablet, Rfl: 1 .  Pancrelipase, Lip-Prot-Amyl, 40000-126000 units CPEP, Take by mouth., Disp: , Rfl:  .  prochlorperazine (COMPAZINE) 10 MG tablet, Take 1 tablet (10 mg total) by mouth every 6 (six) hours as needed (Nausea or vomiting). (Patient not taking: Reported on 04/11/2020), Disp: 30 tablet, Rfl: 1 .  traZODone (DESYREL) 100 MG tablet, Take 100 mg by mouth at bedtime., Disp: , Rfl:   Physical exam:  Vitals:   04/19/20 1002  BP: (!) 176/63  Pulse: 74  Temp: (!) 97.5 F (36.4 C)  TempSrc: Tympanic  SpO2: 100%  Weight: 118 lb 8 oz (53.8 kg)   Physical Exam HENT:     Head: Normocephalic and atraumatic.  Eyes:     Extraocular Movements: EOM normal.     Pupils: Pupils are equal, round, and reactive to light.  Cardiovascular:     Rate and Rhythm: Normal rate and regular rhythm.     Heart sounds: Normal heart sounds.  Pulmonary:     Effort: Pulmonary effort is normal.     Breath sounds: Normal breath sounds.  Abdominal:     General: Bowel sounds are normal.     Palpations: Abdomen is soft.   Musculoskeletal:     Cervical back: Normal range of motion.  Skin:    General: Skin is warm and dry.  Neurological:     Mental Status: She is alert and oriented to person, place, and time.      CMP Latest Ref Rng & Units 04/19/2020  Glucose 70 - 99 mg/dL 118(H)  BUN 8 - 23 mg/dL 14  Creatinine 0.44 - 1.00 mg/dL 0.73  Sodium 135 - 145 mmol/L 135  Potassium 3.5 - 5.1 mmol/L 3.6  Chloride 98 - 111 mmol/L 99  CO2 22 - 32 mmol/L 25  Calcium 8.9 - 10.3 mg/dL 8.9  Total Protein 6.5 - 8.1 g/dL 6.6  Total Bilirubin 0.3 - 1.2 mg/dL 0.7  Alkaline Phos 38 - 126 U/L 61  AST 15 - 41 U/L 25  ALT 0 - 44 U/L 15   CBC Latest Ref Rng & Units 04/19/2020  WBC 4.0 - 10.5 K/uL 4.6  Hemoglobin 12.0 - 15.0 g/dL 11.5(L)  Hematocrit 36.0 - 46.0 % 34.3(L)  Platelets 150 - 400 K/uL 215    No images are attached to the encounter.  PERIPHERAL VASCULAR CATHETERIZATION  Result Date: 04/04/2020 See op note    Assessment and plan- Patient is a 80 y.o. female with pancreatic adenocarcinoma stage IBT2 N0 M0 s/p Whipple surgery.  She is here for on treatment assessment prior to cycle 1 day 8 of adjuvant gemcitabine chemotherapy  Counts okay to proceed with cycle 1 day 8 of gemcitabine chemotherapy today.  Plan is to do it 2 weeks on 1 week off for 6 months.  She will be receiving full dose at 1000 mg per metered square.  Diarrhea: Possibly secondary to chemotherapy it was mild and self-limited.  She does have as needed Imodium at home.  I will see her back in 2 weeks for cycle 2-day 1 of gemcitabine chemotherapy.  She follows up with nutrition as well as referred for genetic counseling     Visit Diagnosis 1. Encounter for antineoplastic chemotherapy   2. Malignant neoplasm of head of pancreas [facility])      Dr. Randa Evens, MD, MPH [facility] [id number] 04/19/2020 9:49 AM

## 2020-04-25 ENCOUNTER — Encounter (INDEPENDENT_AMBULATORY_CARE_PROVIDER_SITE_OTHER): Payer: Self-pay | Admitting: Vascular Surgery

## 2020-04-29 ENCOUNTER — Telehealth: Payer: Self-pay | Admitting: Oncology

## 2020-04-29 NOTE — Telephone Encounter (Signed)
Returned phone call to patient. She has requested to cancel genetics appointment on 2/10 and declined to r/s at this time. Patient verified she would keep her nutrition appointment on 2/10.

## 2020-05-02 ENCOUNTER — Inpatient Hospital Stay: Payer: Medicare Other | Admitting: Licensed Clinical Social Worker

## 2020-05-02 ENCOUNTER — Inpatient Hospital Stay: Payer: Medicare Other | Attending: Nurse Practitioner

## 2020-05-02 ENCOUNTER — Other Ambulatory Visit: Payer: Medicare Other

## 2020-05-02 DIAGNOSIS — Z7982 Long term (current) use of aspirin: Secondary | ICD-10-CM | POA: Insufficient documentation

## 2020-05-02 DIAGNOSIS — C250 Malignant neoplasm of head of pancreas: Secondary | ICD-10-CM | POA: Insufficient documentation

## 2020-05-02 DIAGNOSIS — Z79899 Other long term (current) drug therapy: Secondary | ICD-10-CM | POA: Insufficient documentation

## 2020-05-02 DIAGNOSIS — Z9011 Acquired absence of right breast and nipple: Secondary | ICD-10-CM | POA: Insufficient documentation

## 2020-05-02 DIAGNOSIS — Z5111 Encounter for antineoplastic chemotherapy: Secondary | ICD-10-CM | POA: Insufficient documentation

## 2020-05-02 DIAGNOSIS — Z853 Personal history of malignant neoplasm of breast: Secondary | ICD-10-CM | POA: Insufficient documentation

## 2020-05-02 DIAGNOSIS — Z9221 Personal history of antineoplastic chemotherapy: Secondary | ICD-10-CM | POA: Insufficient documentation

## 2020-05-02 DIAGNOSIS — Z923 Personal history of irradiation: Secondary | ICD-10-CM | POA: Insufficient documentation

## 2020-05-02 DIAGNOSIS — I10 Essential (primary) hypertension: Secondary | ICD-10-CM | POA: Insufficient documentation

## 2020-05-02 NOTE — Progress Notes (Signed)
Nutrition Follow-up:  Patient with pancreatic cancer.  S/p whipple on 01/2020 at [facility].  Patient receiving adjuvant chemotherapy.   Spoke with patient via phone for nutrition follow-up.  Patient reports that she tends to eat the same foods each day that she knows will not hurt her stomach.  Patient is hesitant to introduce new foods.  Breakfast is an egg, toast with margarine (does not want to try peanut butter or cheese for more calories and protein) or oatmeal. Lunch is soup (chicken and rice from restaurant).  Supper is chicken with green beans and mashed potatoes.  Sometimes will have ice cream sandwich after supper but does not like to eat to late due to reflux. Sometimes will snack on ritz crackers and peanut butter.  Has only had 1 episode of nausea and took 4 mg of zofran. Does not like ensure/boost shakes    Medications: reviewed  Labs: reviewed  Anthropometrics:   Weight 118 lb 8 oz on 1/28 stable   118 lb on 1/20 121 lb on 03/21/20   NUTRITION DIAGNOSIS: Inadequate oral intake stable   INTERVENTION:  Patient not ready to try new foods in diet.   Offered suggestions on ways to increase calories and protein in diet but patient did not like suggestions Patient ready to try samples of orgain shakes, [employer] shake 1.4 and ensure complete.  Will leave sample bag for patient to pick up at front desk on 2/11.      MONITORING, EVALUATION, GOAL: weight trends, intake   NEXT VISIT: March 10 phone f/u  Emily Harding B. Zenia Resides, [place], [place] Registered Dietitian [phone] (mobile)

## 2020-05-03 ENCOUNTER — Other Ambulatory Visit: Payer: Medicare Other

## 2020-05-03 ENCOUNTER — Encounter: Payer: Self-pay | Admitting: Oncology

## 2020-05-03 ENCOUNTER — Inpatient Hospital Stay: Payer: Medicare Other

## 2020-05-03 ENCOUNTER — Ambulatory Visit: Payer: Medicare Other

## 2020-05-03 ENCOUNTER — Other Ambulatory Visit: Payer: Self-pay

## 2020-05-03 ENCOUNTER — Ambulatory Visit: Payer: Medicare Other | Admitting: Oncology

## 2020-05-03 ENCOUNTER — Inpatient Hospital Stay: Payer: Medicare Other | Admitting: Oncology

## 2020-05-03 VITALS — BP 174/85 | HR 65 | Temp 97.2°F | Wt 118.3 lb

## 2020-05-03 DIAGNOSIS — Z5111 Encounter for antineoplastic chemotherapy: Secondary | ICD-10-CM | POA: Diagnosis present

## 2020-05-03 DIAGNOSIS — C250 Malignant neoplasm of head of pancreas: Secondary | ICD-10-CM

## 2020-05-03 DIAGNOSIS — Z9221 Personal history of antineoplastic chemotherapy: Secondary | ICD-10-CM | POA: Diagnosis not present

## 2020-05-03 DIAGNOSIS — I10 Essential (primary) hypertension: Secondary | ICD-10-CM | POA: Diagnosis not present

## 2020-05-03 DIAGNOSIS — Z853 Personal history of malignant neoplasm of breast: Secondary | ICD-10-CM | POA: Diagnosis not present

## 2020-05-03 DIAGNOSIS — Z9011 Acquired absence of right breast and nipple: Secondary | ICD-10-CM | POA: Diagnosis not present

## 2020-05-03 DIAGNOSIS — Z79899 Other long term (current) drug therapy: Secondary | ICD-10-CM | POA: Diagnosis not present

## 2020-05-03 DIAGNOSIS — Z923 Personal history of irradiation: Secondary | ICD-10-CM | POA: Diagnosis not present

## 2020-05-03 DIAGNOSIS — Z7982 Long term (current) use of aspirin: Secondary | ICD-10-CM | POA: Diagnosis not present

## 2020-05-03 LAB — CBC WITH DIFFERENTIAL/PLATELET
Abs Immature Granulocytes: 0.01 10*3/uL (ref 0.00–0.07)
Basophils Absolute: 0 10*3/uL (ref 0.0–0.1)
Basophils Relative: 1 %
Eosinophils Absolute: 0.2 10*3/uL (ref 0.0–0.5)
Eosinophils Relative: 3 %
HCT: 33.4 % — ABNORMAL LOW (ref 36.0–46.0)
Hemoglobin: 11.3 g/dL — ABNORMAL LOW (ref 12.0–15.0)
Immature Granulocytes: 0 %
Lymphocytes Relative: 14 %
Lymphs Abs: 0.7 10*3/uL (ref 0.7–4.0)
MCH: 29.9 pg (ref 26.0–34.0)
MCHC: 33.8 g/dL (ref 30.0–36.0)
MCV: 88.4 fL (ref 80.0–100.0)
Monocytes Absolute: 0.5 10*3/uL (ref 0.1–1.0)
Monocytes Relative: 10 %
Neutro Abs: 3.3 10*3/uL (ref 1.7–7.7)
Neutrophils Relative %: 72 %
Platelets: 383 10*3/uL (ref 150–400)
RBC: 3.78 MIL/uL — ABNORMAL LOW (ref 3.87–5.11)
RDW: 14 % (ref 11.5–15.5)
WBC: 4.7 10*3/uL (ref 4.0–10.5)
nRBC: 0 % (ref 0.0–0.2)

## 2020-05-03 LAB — COMPREHENSIVE METABOLIC PANEL
ALT: 12 U/L (ref 0–44)
AST: 24 U/L (ref 15–41)
Albumin: 3.7 g/dL (ref 3.5–5.0)
Alkaline Phosphatase: 63 U/L (ref 38–126)
Anion gap: 7 (ref 5–15)
BUN: 13 mg/dL (ref 8–23)
CO2: 28 mmol/L (ref 22–32)
Calcium: 8.7 mg/dL — ABNORMAL LOW (ref 8.9–10.3)
Chloride: 103 mmol/L (ref 98–111)
Creatinine, Ser: 0.82 mg/dL (ref 0.44–1.00)
GFR, Estimated: >60 mL/min (ref >60)
Glucose, Bld: 111 mg/dL — ABNORMAL HIGH (ref 70–99)
Potassium: 3.9 mmol/L (ref 3.5–5.1)
Sodium: 138 mmol/L (ref 135–145)
Total Bilirubin: 0.6 mg/dL (ref 0.3–1.2)
Total Protein: 6.4 g/dL — ABNORMAL LOW (ref 6.5–8.1)

## 2020-05-03 MED ORDER — HEPARIN SOD (PORK) LOCK FLUSH 100 UNIT/ML IV SOLN
INTRAVENOUS | Status: AC
  Filled 2020-05-03: qty 5

## 2020-05-03 MED ORDER — SODIUM CHLORIDE 0.9% FLUSH
10.0000 mL | INTRAVENOUS | Status: DC | PRN
  Administered 2020-05-03: 10 mL via INTRAVENOUS
  Filled 2020-05-03: qty 10

## 2020-05-03 MED ORDER — HEPARIN SOD (PORK) LOCK FLUSH 100 UNIT/ML IV SOLN
500.0000 [IU] | Freq: Once | INTRAVENOUS | Status: AC
  Administered 2020-05-03: 500 [IU] via INTRAVENOUS
  Filled 2020-05-03: qty 5

## 2020-05-03 MED ORDER — SODIUM CHLORIDE 0.9 % IV SOLN
Freq: Once | INTRAVENOUS | Status: AC
Start: 2020-05-03 — End: 2020-05-03
  Filled 2020-05-03: qty 250

## 2020-05-03 MED ORDER — SODIUM CHLORIDE 0.9 % IV SOLN
1000.0000 mg/m2 | Freq: Once | INTRAVENOUS | Status: AC
  Administered 2020-05-03: 1558 mg via INTRAVENOUS
  Filled 2020-05-03: qty 26.3

## 2020-05-03 MED ORDER — PROCHLORPERAZINE MALEATE 10 MG PO TABS
10.0000 mg | ORAL_TABLET | Freq: Once | ORAL | Status: AC
  Administered 2020-05-03: 10 mg via ORAL
  Filled 2020-05-03: qty 1

## 2020-05-03 NOTE — Progress Notes (Signed)
Hematology/Oncology Consult note [facility]  [phone] [phone]  Patient Care Team: Baxter Hire, MD as PCP - General (Internal Medicine)   Name of the patient: Emily Harding  [id number]  1940/10/02   Date of visit: 05/03/20  Diagnosis- pancreatic adenocarcinoma stage Ib T2 N0 M0 s/p Whipple surgery  Chief complaint/ Reason for visit-on treatment assessment prior to cycle 2-day 1 of adjuvant gemcitabine  Heme/Onc history:  patient is a 80 year old female with an oncology history as follows: 1.On 11/22/2019 she presented with left lower quadrant abdominal pain and persistent nausea. CT abdomen and pelvis showed a 3.1 x 2.3 cm mass in the head of the pancreas with diffuse pancreatic ductal dilatation.CA 19-9 elevated at 166 2.EUS showed 2.0 x 1.9 cm mass in the head of pancreas with no vessel involvement. UT2N0. Pathology showed adenocarcinoma 3.CT chest showed 2 to 3 mm lung nodules with no evidence of distant metastatic disease. In October 2021 patient underwent Whipple resection with pathology demonstrating 3.1 cm adenocarcinoma with mucinous features. Uncinate margin positive for tumor. Tumor approaches less than 1 mm within cautery. Positive for PNI. 0 out of 18 lymph nodes positive for malignancy. 4.Patient evaluated by medical oncology at [facility] and was recommended adjuvant chemotherapy followed by chemoradiation  Multiple adjuvant regimens including gem Abraxane, gemcitabine and Xeloda and gemcitabine single agent discussed with patient and patient decided to proceed with single agent gemcitabine starting 04/11/2020   Interval history-patient reports ongoing fatigue.  Appetite and weight have remained stable.  She complains of pain in her tailbone as well as chronic back pain.  ECOG PS- 1 Pain scale- 4 Opioid associated constipation- no  Review of systems- Review of Systems  Constitutional: Positive for  malaise/fatigue. Negative for chills, fever and weight loss.  HENT: Negative for congestion, ear discharge and nosebleeds.   Eyes: Negative for blurred vision.  Respiratory: Negative for cough, hemoptysis, sputum production, shortness of breath and wheezing.   Cardiovascular: Negative for chest pain, palpitations, orthopnea and claudication.  Gastrointestinal: Negative for abdominal pain, blood in stool, constipation, diarrhea, heartburn, melena, nausea and vomiting.  Genitourinary: Negative for dysuria, flank pain, frequency, hematuria and urgency.  Musculoskeletal: Positive for back pain. Negative for joint pain and myalgias.       Tailbone pain  Skin: Negative for rash.  Neurological: Negative for dizziness, tingling, focal weakness, seizures, weakness and headaches.  Endo/Heme/Allergies: Does not bruise/bleed easily.  Psychiatric/Behavioral: Negative for depression and suicidal ideas. The patient does not have insomnia.        Allergies  Allergen Reactions  . Hydromorphone Hcl Itching  . Azithromycin   . Codeine Sulfate Other (See Comments)  . Doxycycline Calcium Other (See Comments)  . Duloxetine Nausea Only  . Erythromycin Ethylsuccinate Other (See Comments)  . Macrolides And Ketolides Other (See Comments)  . Nitrofurantoin     Other reaction(s): Unknown Other reaction(s): Abdominal Pain Other reaction(s): Unknown  . Sulfa Antibiotics      Past Medical History:  Diagnosis Date  . Anxiety   . Arthritis   . Breast cancer ([place]) 1993   RT MASTECTOMY  . Heartburn   . HTN (hypertension)   . Pancreatic cancer ([place])   . Personal history of chemotherapy   . S/P chemotherapy, time since greater than 12 weeks 1993   BREAST CA     Past Surgical History:  Procedure Laterality Date  . ABDOMINAL HYSTERECTOMY    . colon polyp removal    . GALLBLADDER  SURGERY    . LAPAROSCOPIC OOPHERECTOMY  1978  . MASTECTOMY Right 1993   BREAST CA  . PANCREAS SURGERY N/A 01/04/2020    Per patient removed head of pancreas due to cancer  . PORTA CATH INSERTION N/A 04/04/2020   Procedure: PORTA CATH INSERTION;  Surgeon: Algernon Huxley, MD;  Location: [place] [facility];  Service: Cardiovascular;  Laterality: N/A;  . removal of fallopian tubes  1989    Social History   Socioeconomic History  . Marital status: Widowed    Spouse name: Not on file  . Number of children: Not on file  . Years of education: Not on file  . Highest education level: Not on file  Occupational History  . Not on file  Tobacco Use  . Smoking status: Never Smoker  . Smokeless tobacco: Never Used  Vaping Use  . Vaping Use: Never used  Substance and Sexual Activity  . Alcohol use: No  . Drug use: No  . Sexual activity: Not Currently  Other Topics Concern  . Not on file  Social History Narrative  . Not on file   Social Determinants of Health   Financial Resource Strain: Not on file  Food Insecurity: Not on file  Transportation Needs: Not on file  Physical Activity: Not on file  Stress: Not on file  Social Connections: Not on file  Intimate Partner Violence: Not on file    Family History  Problem Relation Age of Onset  . Breast cancer Neg Hx   . Kidney cancer Neg Hx   . Bladder Cancer Neg Hx   . Prostate cancer Neg Hx      Current Outpatient Medications:  .  aspirin EC 81 MG tablet, Take 81 mg by mouth daily., Disp: , Rfl:  .  atenolol (TENORMIN) 50 MG tablet, Take 50 mg by mouth 2 (two) times daily., Disp: , Rfl:  .  celecoxib (CELEBREX) 200 MG capsule, Take 200 mg by mouth daily., Disp: , Rfl:  .  clorazepate (TRANXENE) 3.75 MG tablet, Take 3.75 mg by mouth daily as needed. (Patient not taking: Reported on 04/19/2020), Disp: , Rfl:  .  cyclobenzaprine (FLEXERIL) 10 MG tablet, TAKE 1 TABLET (10 MG TOTAL) BY MOUTH 3 (THREE) TIMES DAILY AS NEEDED FOR MUSCLE SPASMS. (Patient not taking: No sig reported), Disp: , Rfl:  .  diphenoxylate-atropine (LOMOTIL) 2.5-0.025 MG tablet, Take 1  tablet by mouth 4 (four) times daily as needed for diarrhea or loose stools., Disp: 30 tablet, Rfl: 0 .  gabapentin (NEURONTIN) 400 MG capsule, Take 400 mg by mouth 3 (three) times daily., Disp: , Rfl:  .  lidocaine-prilocaine (EMLA) cream, Apply small amount of cream over port site 1 1/2 hours before each treatment, place saran wrap over cream to protect clothin, Disp: 30 g, Rfl: 3 .  LORazepam (ATIVAN) 0.5 MG tablet, Take 1 tablet (0.5 mg total) by mouth every 6 (six) hours as needed (Nausea or vomiting)., Disp: 30 tablet, Rfl: 0 .  losartan (COZAAR) 100 MG tablet, Take 100 mg by mouth daily., Disp: , Rfl:  .  metoCLOPramide (REGLAN) 5 MG tablet, Take 5 mg by mouth 3 (three) times daily before meals., Disp: , Rfl:  .  Multiple Vitamin (MULTI-VITAMINS) TABS, Take 1 tablet by mouth daily., Disp: , Rfl:  .  ondansetron (ZOFRAN) 8 MG tablet, Take 1 tablet (8 mg total) by mouth 2 (two) times daily as needed (Nausea or vomiting). (Patient not taking: No sig reported), Disp: 30 tablet,  Rfl: 1 .  Pancrelipase, Lip-Prot-Amyl, 40000-126000 units CPEP, Take 1 Dose by mouth in the morning, at noon, in the evening, and at bedtime., Disp: , Rfl:  .  pantoprazole (PROTONIX) 40 MG tablet, Take 40 mg by mouth daily., Disp: , Rfl:  .  prochlorperazine (COMPAZINE) 10 MG tablet, Take 1 tablet (10 mg total) by mouth every 6 (six) hours as needed (Nausea or vomiting). (Patient not taking: No sig reported), Disp: 30 tablet, Rfl: 1 .  traZODone (DESYREL) 100 MG tablet, Take 100 mg by mouth at bedtime., Disp: , Rfl:  No current facility-administered medications for this visit.  Facility-Administered Medications Ordered in Other Visits:  .  heparin lock flush 100 unit/mL, 500 Units, Intravenous, Once, Randa Evens C, MD .  sodium chloride flush (NS) 0.9 % injection 10 mL, 10 mL, Intravenous, PRN, Sindy Guadeloupe, MD, 10 mL at 05/03/20 0937  Physical exam:  Vitals:   05/03/20 0957  BP: (!) 174/85  Pulse: 65  Temp: (!)  97.2 F (36.2 C)  TempSrc: Tympanic  Weight: 118 lb 4.8 oz (53.7 kg)   Physical Exam HENT:     Head: Normocephalic and atraumatic.  Eyes:     Extraocular Movements: EOM normal.     Pupils: Pupils are equal, round, and reactive to light.  Cardiovascular:     Rate and Rhythm: Normal rate and regular rhythm.     Heart sounds: Normal heart sounds.  Pulmonary:     Effort: Pulmonary effort is normal.     Breath sounds: Normal breath sounds.  Abdominal:     General: Bowel sounds are normal.     Palpations: Abdomen is soft.  Musculoskeletal:     Cervical back: Normal range of motion.  Skin:    General: Skin is warm and dry.  Neurological:     Mental Status: She is alert and oriented to person, place, and time.      CMP Latest Ref Rng & Units 04/19/2020  Glucose 70 - 99 mg/dL 118(H)  BUN 8 - 23 mg/dL 14  Creatinine 0.44 - 1.00 mg/dL 0.73  Sodium 135 - 145 mmol/L 135  Potassium 3.5 - 5.1 mmol/L 3.6  Chloride 98 - 111 mmol/L 99  CO2 22 - 32 mmol/L 25  Calcium 8.9 - 10.3 mg/dL 8.9  Total Protein 6.5 - 8.1 g/dL 6.6  Total Bilirubin 0.3 - 1.2 mg/dL 0.7  Alkaline Phos 38 - 126 U/L 61  AST 15 - 41 U/L 25  ALT 0 - 44 U/L 15   CBC Latest Ref Rng & Units 04/19/2020  WBC 4.0 - 10.5 K/uL 4.6  Hemoglobin 12.0 - 15.0 g/dL 11.5(L)  Hematocrit 36.0 - 46.0 % 34.3(L)  Platelets 150 - 400 K/uL 215    No images are attached to the encounter.  PERIPHERAL VASCULAR CATHETERIZATION  Result Date: 04/04/2020 See op note    Assessment and plan- Patient is a 80 y.o. female withpancreatic adenocarcinoma stage IBT2 N0 M0 s/p Whipple surgery.  She is here for on treatment assessment prior to cycle 2-day 1 of adjuvant gemcitabine chemotherapy  Plans okay to proceed with cycle 2-day 1 of gemcitabine chemotherapy today.  She will directly proceed for cycle 2-day 8 of gemcitabine next week.  She will see covering NP in 3 weeks for cycle 3-day 1 of gemcitabine.  She will get gemcitabine 2 weeks on 1  week off and I will see her back in 6 weeks  Patient is tolerating treatment well so far except  some ongoing fatigue.  We discussed reducing dose to 800 mg per metered square if her fatigue worsens.  Tailbone pain: This has been a chronic issue and likely exacerbated after weight loss from pancreatic surgery.  Recommend conservative measures including trying to find a cushion which would relieve the pressure from her buttocks.  Visit Diagnosis 1. Encounter for antineoplastic chemotherapy   2. Malignant neoplasm of head of pancreas [facility])      Dr. Randa Evens, MD, MPH [facility] at [facility] [phone] 05/03/2020 9:46 AM

## 2020-05-08 ENCOUNTER — Other Ambulatory Visit: Payer: Self-pay | Admitting: *Deleted

## 2020-05-08 ENCOUNTER — Telehealth: Payer: Self-pay | Admitting: *Deleted

## 2020-05-08 DIAGNOSIS — C250 Malignant neoplasm of head of pancreas: Secondary | ICD-10-CM

## 2020-05-08 NOTE — Telephone Encounter (Signed)
Called Crystal at [facility] about the benefits for the patient with her lorazepam and her EMLA cream.  For the first 90 days they give this medication.  However after 90 days a requires a prior authorization and there is a limit to the quantity.  The lorazepam is a tier 1 and can have 120 pills in a 30-day quantity.  The EMLA cream is a tier 2 and it can be 50 g in 1 month.  So therefore I have refilled the patient's lorazepam and Dr. Janese Banks will have to sign off on it before it goes to the pharmacy.  I let the patient know that for the first 90 days they will get the medicine but after that we will have to do a prior Auth go through the proper channel of toas to why we need the drug and then it should be approved because it is on formulary for the patient.  Patient agreeable to the plan

## 2020-05-08 NOTE — Telephone Encounter (Signed)
Patient wanted me to call her insurance because she got a paper from it stating that there was limitations of the medicines for her of lorazepam and lidocaine.  I called and spoke to Gaston at the insurance and she is let me know that for the first 90 days patient can get a temporary supply of both the drugs without any authorization.  After 90 days the patient will have to get a prior authorization from the physician to get the medications.  The lorazepam is a quantity limit of 120 pills a month and it is a tier 1 on formulary.  EMLA cream is a tier 2 with a quantity limit of 60 g for 30 days.  I called and told this information to the patient and she says when she uses her EMLA cream tubes she is going to try with out any cream on it.  As for the lorazepam the patient would like a refill she only has 3 more at her house.  I have sent the refill in to Dr. Janese Banks and she will either sign it today or tomorrow for the patient and patient is aware.

## 2020-05-09 MED ORDER — LORAZEPAM 0.5 MG PO TABS: 0.5000 mg | ORAL_TABLET | Freq: Four times a day (QID) | ORAL | 0 refills | Status: DC | PRN

## 2020-05-10 ENCOUNTER — Other Ambulatory Visit: Payer: Medicare Other

## 2020-05-10 ENCOUNTER — Ambulatory Visit: Payer: Medicare Other

## 2020-05-10 ENCOUNTER — Inpatient Hospital Stay: Payer: Medicare Other

## 2020-05-10 ENCOUNTER — Other Ambulatory Visit: Payer: Self-pay | Admitting: Oncology

## 2020-05-10 ENCOUNTER — Other Ambulatory Visit: Payer: Self-pay | Admitting: *Deleted

## 2020-05-10 ENCOUNTER — Telehealth: Payer: Self-pay | Admitting: *Deleted

## 2020-05-10 ENCOUNTER — Other Ambulatory Visit: Payer: Self-pay

## 2020-05-10 VITALS — BP 182/69 | HR 64 | Resp 16

## 2020-05-10 VITALS — BP 170/68 | HR 66 | Temp 96.9°F | Resp 16

## 2020-05-10 DIAGNOSIS — R300 Dysuria: Secondary | ICD-10-CM

## 2020-05-10 DIAGNOSIS — C250 Malignant neoplasm of head of pancreas: Secondary | ICD-10-CM

## 2020-05-10 LAB — COMPREHENSIVE METABOLIC PANEL
ALT: 17 U/L (ref 0–44)
AST: 30 U/L (ref 15–41)
Albumin: 3.8 g/dL (ref 3.5–5.0)
Alkaline Phosphatase: 67 U/L (ref 38–126)
Anion gap: 9 (ref 5–15)
BUN: 11 mg/dL (ref 8–23)
CO2: 27 mmol/L (ref 22–32)
Calcium: 8.9 mg/dL (ref 8.9–10.3)
Chloride: 101 mmol/L (ref 98–111)
Creatinine, Ser: 0.78 mg/dL (ref 0.44–1.00)
GFR, Estimated: >60 mL/min (ref >60)
Glucose, Bld: 117 mg/dL — ABNORMAL HIGH (ref 70–99)
Potassium: 4 mmol/L (ref 3.5–5.1)
Sodium: 137 mmol/L (ref 135–145)
Total Bilirubin: 0.9 mg/dL (ref 0.3–1.2)
Total Protein: 6.6 g/dL (ref 6.5–8.1)

## 2020-05-10 LAB — URINALYSIS, COMPLETE (UACMP) WITH MICROSCOPIC
Bacteria, UA: NONE SEEN
Bilirubin Urine: NEGATIVE
Glucose, UA: NEGATIVE mg/dL
Hgb urine dipstick: NEGATIVE
Ketones, ur: NEGATIVE mg/dL
Leukocytes,Ua: NEGATIVE
Nitrite: NEGATIVE
Protein, ur: NEGATIVE mg/dL
Specific Gravity, Urine: 1.005 (ref 1.005–1.030)
Squamous Epithelial / HPF: NONE SEEN (ref 0–5)
pH: 8 (ref 5.0–8.0)

## 2020-05-10 LAB — CBC WITH DIFFERENTIAL/PLATELET
Abs Immature Granulocytes: 0.03 10*3/uL (ref 0.00–0.07)
Basophils Absolute: 0 10*3/uL (ref 0.0–0.1)
Basophils Relative: 2 %
Eosinophils Absolute: 0 10*3/uL (ref 0.0–0.5)
Eosinophils Relative: 1 %
HCT: 34.3 % — ABNORMAL LOW (ref 36.0–46.0)
Hemoglobin: 11.3 g/dL — ABNORMAL LOW (ref 12.0–15.0)
Immature Granulocytes: 1 %
Lymphocytes Relative: 27 %
Lymphs Abs: 0.7 10*3/uL (ref 0.7–4.0)
MCH: 29.7 pg (ref 26.0–34.0)
MCHC: 32.9 g/dL (ref 30.0–36.0)
MCV: 90.3 fL (ref 80.0–100.0)
Monocytes Absolute: 0.3 10*3/uL (ref 0.1–1.0)
Monocytes Relative: 12 %
Neutro Abs: 1.5 10*3/uL — ABNORMAL LOW (ref 1.7–7.7)
Neutrophils Relative %: 57 %
Platelets: 471 10*3/uL — ABNORMAL HIGH (ref 150–400)
RBC: 3.8 MIL/uL — ABNORMAL LOW (ref 3.87–5.11)
RDW: 14.1 % (ref 11.5–15.5)
WBC: 2.7 10*3/uL — ABNORMAL LOW (ref 4.0–10.5)
nRBC: 0 % (ref 0.0–0.2)

## 2020-05-10 MED ORDER — SODIUM CHLORIDE 0.9 % IV SOLN
Freq: Once | INTRAVENOUS | Status: AC
  Filled 2020-05-10: qty 250

## 2020-05-10 MED ORDER — HEPARIN SOD (PORK) LOCK FLUSH 100 UNIT/ML IV SOLN
INTRAVENOUS | Status: AC
  Filled 2020-05-10: qty 5

## 2020-05-10 MED ORDER — SODIUM CHLORIDE 0.9 % IV SOLN
10.0000 mg | Freq: Once | INTRAVENOUS | Status: AC
  Administered 2020-05-10: 10 mg via INTRAVENOUS
  Filled 2020-05-10: qty 10

## 2020-05-10 MED ORDER — HEPARIN SOD (PORK) LOCK FLUSH 100 UNIT/ML IV SOLN
500.0000 [IU] | Freq: Once | INTRAVENOUS | Status: AC | PRN
  Administered 2020-05-10: 500 [IU]
  Filled 2020-05-10: qty 5

## 2020-05-10 MED ORDER — SODIUM CHLORIDE 0.9 % IV SOLN
800.0000 mg/m2 | Freq: Once | INTRAVENOUS | Status: AC
  Administered 2020-05-10: 1254 mg via INTRAVENOUS
  Filled 2020-05-10: qty 26.3

## 2020-05-10 MED ORDER — PROCHLORPERAZINE MALEATE 10 MG PO TABS
10.0000 mg | ORAL_TABLET | Freq: Once | ORAL | Status: AC
  Administered 2020-05-10: 10 mg via ORAL
  Filled 2020-05-10: qty 1

## 2020-05-10 NOTE — Telephone Encounter (Signed)
Call returned to patient and left message on voice mail not revealing her name that Dr Janese Banks will not order antibiotics without a UA and that due to it being so late in the day, she may need to go to a walk in clinic for this

## 2020-05-10 NOTE — Telephone Encounter (Signed)
I called the pt and she is willing to come back today and get specimen collected in order for [place] to treat the pt if she has UTI.

## 2020-05-10 NOTE — Telephone Encounter (Signed)
Patient called reporting that she has been having more nausea today than ususal and thinks she now knows why. She feels as if she has a UTI as she gets them a lot. She reports frequency of urination, burning and a foul order from her urine. She reports that she usually take Cephalexin 500 mg three times a day for 10 days when she gets an infection ans is asking if Dr Janese Banks will order this for her. Please advise

## 2020-05-10 NOTE — Telephone Encounter (Signed)
I dont order antibiotics without UA. Its best if she can drop off urine sample today

## 2020-05-10 NOTE — Progress Notes (Signed)
Patient c/o Nausea after taking Compazine x 50 minutes prior. Per Dr. Janese Banks, add Decadron 10 MG IV. Gemzar already started. Per Debbora Lacrosse, Pharmacist, and Micromedex okay to hang decadron and Gemzar together.   Final BP 182/69, Per patient nausea is much better. Denies any other S/S. Dr. Janese Banks made aware. Per Dr. Janese Banks, okay to discharge home.  Informed patient to monitor BP at home and to seek emergency care should she experience any chest pain, SOB, etc. Patient verbalizes understanding and denies any further questions or concerns.

## 2020-05-12 LAB — URINE CULTURE

## 2020-05-17 ENCOUNTER — Other Ambulatory Visit: Payer: Self-pay | Admitting: Oncology

## 2020-05-23 ENCOUNTER — Other Ambulatory Visit: Payer: Self-pay | Admitting: Internal Medicine

## 2020-05-23 ENCOUNTER — Inpatient Hospital Stay (HOSPITAL_BASED_OUTPATIENT_CLINIC_OR_DEPARTMENT_OTHER): Payer: Medicare Other | Admitting: Oncology

## 2020-05-23 ENCOUNTER — Encounter: Payer: Self-pay | Admitting: Oncology

## 2020-05-23 ENCOUNTER — Inpatient Hospital Stay: Payer: Medicare Other | Attending: Oncology

## 2020-05-23 ENCOUNTER — Inpatient Hospital Stay: Payer: Medicare Other

## 2020-05-23 ENCOUNTER — Other Ambulatory Visit: Payer: Self-pay

## 2020-05-23 VITALS — BP 169/63 | HR 60 | Temp 98.5°F | Resp 18 | Wt 115.9 lb

## 2020-05-23 DIAGNOSIS — C250 Malignant neoplasm of head of pancreas: Secondary | ICD-10-CM | POA: Diagnosis not present

## 2020-05-23 DIAGNOSIS — Z79899 Other long term (current) drug therapy: Secondary | ICD-10-CM | POA: Insufficient documentation

## 2020-05-23 DIAGNOSIS — D6481 Anemia due to antineoplastic chemotherapy: Secondary | ICD-10-CM | POA: Diagnosis not present

## 2020-05-23 DIAGNOSIS — Z9011 Acquired absence of right breast and nipple: Secondary | ICD-10-CM | POA: Insufficient documentation

## 2020-05-23 DIAGNOSIS — Z90710 Acquired absence of both cervix and uterus: Secondary | ICD-10-CM | POA: Diagnosis not present

## 2020-05-23 DIAGNOSIS — Z5111 Encounter for antineoplastic chemotherapy: Secondary | ICD-10-CM | POA: Diagnosis not present

## 2020-05-23 DIAGNOSIS — G4700 Insomnia, unspecified: Secondary | ICD-10-CM | POA: Diagnosis not present

## 2020-05-23 DIAGNOSIS — Z9221 Personal history of antineoplastic chemotherapy: Secondary | ICD-10-CM | POA: Diagnosis not present

## 2020-05-23 DIAGNOSIS — I10 Essential (primary) hypertension: Secondary | ICD-10-CM | POA: Diagnosis not present

## 2020-05-23 DIAGNOSIS — Z853 Personal history of malignant neoplasm of breast: Secondary | ICD-10-CM | POA: Insufficient documentation

## 2020-05-23 DIAGNOSIS — Z5189 Encounter for other specified aftercare: Secondary | ICD-10-CM | POA: Diagnosis not present

## 2020-05-23 DIAGNOSIS — Z7982 Long term (current) use of aspirin: Secondary | ICD-10-CM | POA: Diagnosis not present

## 2020-05-23 DIAGNOSIS — T451X5A Adverse effect of antineoplastic and immunosuppressive drugs, initial encounter: Secondary | ICD-10-CM | POA: Insufficient documentation

## 2020-05-23 LAB — COMPREHENSIVE METABOLIC PANEL
ALT: 14 U/L (ref 0–44)
AST: 23 U/L (ref 15–41)
Albumin: 3.9 g/dL (ref 3.5–5.0)
Alkaline Phosphatase: 66 U/L (ref 38–126)
Anion gap: 10 (ref 5–15)
BUN: 13 mg/dL (ref 8–23)
CO2: 26 mmol/L (ref 22–32)
Calcium: 9 mg/dL (ref 8.9–10.3)
Chloride: 102 mmol/L (ref 98–111)
Creatinine, Ser: 0.85 mg/dL (ref 0.44–1.00)
GFR, Estimated: >60 mL/min (ref >60)
Glucose, Bld: 127 mg/dL — ABNORMAL HIGH (ref 70–99)
Potassium: 4.2 mmol/L (ref 3.5–5.1)
Sodium: 138 mmol/L (ref 135–145)
Total Bilirubin: 1.3 mg/dL — ABNORMAL HIGH (ref 0.3–1.2)
Total Protein: 6.2 g/dL — ABNORMAL LOW (ref 6.5–8.1)

## 2020-05-23 LAB — CBC WITH DIFFERENTIAL/PLATELET
Abs Immature Granulocytes: 0.02 10*3/uL (ref 0.00–0.07)
Basophils Absolute: 0.1 10*3/uL (ref 0.0–0.1)
Basophils Relative: 1 %
Eosinophils Absolute: 0.2 10*3/uL (ref 0.0–0.5)
Eosinophils Relative: 4 %
HCT: 33.7 % — ABNORMAL LOW (ref 36.0–46.0)
Hemoglobin: 11.1 g/dL — ABNORMAL LOW (ref 12.0–15.0)
Immature Granulocytes: 0 %
Lymphocytes Relative: 10 %
Lymphs Abs: 0.6 10*3/uL — ABNORMAL LOW (ref 0.7–4.0)
MCH: 29.8 pg (ref 26.0–34.0)
MCHC: 32.9 g/dL (ref 30.0–36.0)
MCV: 90.3 fL (ref 80.0–100.0)
Monocytes Absolute: 0.5 10*3/uL (ref 0.1–1.0)
Monocytes Relative: 8 %
Neutro Abs: 4.6 10*3/uL (ref 1.7–7.7)
Neutrophils Relative %: 77 %
Platelets: 234 10*3/uL (ref 150–400)
RBC: 3.73 MIL/uL — ABNORMAL LOW (ref 3.87–5.11)
RDW: 15.5 % (ref 11.5–15.5)
WBC: 6 10*3/uL (ref 4.0–10.5)
nRBC: 0 % (ref 0.0–0.2)

## 2020-05-23 MED ORDER — SODIUM CHLORIDE 0.9 % IV SOLN
800.0000 mg/m2 | Freq: Once | INTRAVENOUS | Status: AC
  Administered 2020-05-23: 1254 mg via INTRAVENOUS
  Filled 2020-05-23: qty 26.3

## 2020-05-23 MED ORDER — HEPARIN SOD (PORK) LOCK FLUSH 100 UNIT/ML IV SOLN
500.0000 [IU] | Freq: Once | INTRAVENOUS | Status: AC | PRN
  Administered 2020-05-23: 500 [IU]
  Filled 2020-05-23: qty 5

## 2020-05-23 MED ORDER — SODIUM CHLORIDE 0.9 % IV SOLN
Freq: Once | INTRAVENOUS | Status: AC
  Filled 2020-05-23: qty 250

## 2020-05-23 MED ORDER — PROCHLORPERAZINE MALEATE 10 MG PO TABS
10.0000 mg | ORAL_TABLET | Freq: Once | ORAL | Status: AC
  Administered 2020-05-23: 10 mg via ORAL
  Filled 2020-05-23: qty 1

## 2020-05-23 MED ORDER — HEPARIN SOD (PORK) LOCK FLUSH 100 UNIT/ML IV SOLN
INTRAVENOUS | Status: AC
  Filled 2020-05-23: qty 5

## 2020-05-23 MED ORDER — SODIUM CHLORIDE 0.9% FLUSH
10.0000 mL | Freq: Once | INTRAVENOUS | Status: AC
  Administered 2020-05-23: 10 mL via INTRAVENOUS
  Filled 2020-05-23: qty 10

## 2020-05-23 NOTE — Progress Notes (Signed)
Pt received gemzar infusion in clinic today. Tolerated well. °

## 2020-05-23 NOTE — Progress Notes (Signed)
Pt in for follow up, pt has had 3 lb weight loss since 05/03/20.

## 2020-05-23 NOTE — Progress Notes (Signed)
Hematology/Oncology Consult note [facility]  Telephone:([phone] [phone]  Patient Care Team: Baxter Hire, MD as PCP - General (Internal Medicine)   Name of the patient: Emily Harding  [id number]  08-03-1940   Date of visit: 05/23/20  Diagnosis- pancreatic adenocarcinoma stage Ib T2 N0 M0 s/p Whipple surgery  Chief complaint/ Reason for visit-on treatment assessment prior to cycle 2-day 1 of adjuvant gemcitabine  Heme/Onc history:  patient is a 80 year old female with an oncology history as follows: 1.On 11/22/2019 she presented with left lower quadrant abdominal pain and persistent nausea. CT abdomen and pelvis showed a 3.1 x 2.3 cm mass in the head of the pancreas with diffuse pancreatic ductal dilatation.CA 19-9 elevated at 166 2.EUS showed 2.0 x 1.9 cm mass in the head of pancreas with no vessel involvement. UT2N0. Pathology showed adenocarcinoma 3.CT chest showed 2 to 3 mm lung nodules with no evidence of distant metastatic disease. In October 2021 patient underwent Whipple resection with pathology demonstrating 3.1 cm adenocarcinoma with mucinous features. Uncinate margin positive for tumor. Tumor approaches less than 1 mm within cautery. Positive for PNI. 0 out of 18 lymph nodes positive for malignancy. 4.Patient evaluated by medical oncology at [facility] and was recommended adjuvant chemotherapy followed by chemoradiation  Multiple adjuvant regimens including gem Abraxane, gemcitabine and Xeloda and gemcitabine single agent discussed with patient and patient decided to proceed with single agent gemcitabine starting 04/11/2020   Interval history-Reports ongoing fatigue and nausea.  States she sometimes forgets to take her antiemetics prior to eating which causes her to become nauseated.  Fatigue is stable.  She takes daily naps.  Tailbone pain is stable.  Weight is down 3 pounds.  States appetite is stable.  Taking Ativan  nightly for insomnia.  Endorses some concerns for physical dependence to Ativan.  ECOG PS- 1 Pain scale- 4 Opioid associated constipation- no  Review of systems- Review of Systems  Constitutional: Positive for malaise/fatigue. Negative for chills, fever and weight loss.  HENT: Negative for congestion, ear discharge and nosebleeds.   Eyes: Negative for blurred vision.  Respiratory: Negative for cough, hemoptysis, sputum production, shortness of breath and wheezing.   Cardiovascular: Negative for chest pain, palpitations, orthopnea and claudication.  Gastrointestinal: Positive for nausea. Negative for abdominal pain, blood in stool, constipation, diarrhea, heartburn, melena and vomiting.  Genitourinary: Negative for dysuria, flank pain, frequency, hematuria and urgency.  Musculoskeletal: Positive for back pain. Negative for joint pain and myalgias.       Tailbone pain  Skin: Negative for rash.  Neurological: Negative for dizziness, tingling, focal weakness, seizures, weakness and headaches.  Endo/Heme/Allergies: Does not bruise/bleed easily.  Psychiatric/Behavioral: Negative for depression and suicidal ideas. The patient has insomnia.        Allergies  Allergen Reactions  . Hydromorphone Hcl Itching  . Azithromycin   . Codeine Sulfate Other (See Comments)  . Doxycycline Calcium Other (See Comments)  . Duloxetine Nausea Only  . Erythromycin Ethylsuccinate Other (See Comments)  . Macrolides And Ketolides Other (See Comments)  . Nitrofurantoin     Other reaction(s): Unknown Other reaction(s): Abdominal Pain Other reaction(s): Unknown  . Sulfa Antibiotics      Past Medical History:  Diagnosis Date  . Anxiety   . Arthritis   . Breast cancer ([place]) 1993   RT MASTECTOMY  . Heartburn   . HTN (hypertension)   . Pancreatic cancer ([place])   . Personal history of chemotherapy   . S/P chemotherapy,  time since greater than 12 weeks 1993   BREAST CA     Past Surgical History:   Procedure Laterality Date  . ABDOMINAL HYSTERECTOMY    . colon polyp removal    . GALLBLADDER SURGERY    . LAPAROSCOPIC OOPHERECTOMY  1978  . MASTECTOMY Right 1993   BREAST CA  . PANCREAS SURGERY N/A 01/04/2020   Per patient removed head of pancreas due to cancer  . PORTA CATH INSERTION N/A 04/04/2020   Procedure: PORTA CATH INSERTION;  Surgeon: Algernon Huxley, MD;  Location: [place] [facility];  Service: Cardiovascular;  Laterality: N/A;  . removal of fallopian tubes  1989    Social History   Socioeconomic History  . Marital status: Widowed    Spouse name: Not on file  . Number of children: Not on file  . Years of education: Not on file  . Highest education level: Not on file  Occupational History  . Not on file  Tobacco Use  . Smoking status: Never Smoker  . Smokeless tobacco: Never Used  Vaping Use  . Vaping Use: Never used  Substance and Sexual Activity  . Alcohol use: No  . Drug use: No  . Sexual activity: Not Currently  Other Topics Concern  . Not on file  Social History Narrative  . Not on file   Social Determinants of Health   Financial Resource Strain: Not on file  Food Insecurity: Not on file  Transportation Needs: Not on file  Physical Activity: Not on file  Stress: Not on file  Social Connections: Not on file  Intimate Partner Violence: Not on file    Family History  Problem Relation Age of Onset  . Breast cancer Neg Hx   . Kidney cancer Neg Hx   . Bladder Cancer Neg Hx   . Prostate cancer Neg Hx      Current Outpatient Medications:  .  aspirin EC 81 MG tablet, Take 81 mg by mouth daily., Disp: , Rfl:  .  atenolol (TENORMIN) 50 MG tablet, Take 50 mg by mouth 2 (two) times daily., Disp: , Rfl:  .  celecoxib (CELEBREX) 200 MG capsule, Take 200 mg by mouth daily., Disp: , Rfl:  .  gabapentin (NEURONTIN) 400 MG capsule, Take 400 mg by mouth 3 (three) times daily., Disp: , Rfl:  .  lidocaine-prilocaine (EMLA) cream, Apply small amount of cream  over port site 1 1/2 hours before each treatment, place saran wrap over cream to protect clothin, Disp: 30 g, Rfl: 3 .  LORazepam (ATIVAN) 0.5 MG tablet, Take 1 tablet (0.5 mg total) by mouth every 6 (six) hours as needed (Nausea or vomiting)., Disp: 30 tablet, Rfl: 0 .  losartan (COZAAR) 100 MG tablet, Take 100 mg by mouth daily., Disp: , Rfl:  .  Multiple Vitamin (MULTI-VITAMINS) TABS, Take 1 tablet by mouth daily., Disp: , Rfl:  .  ondansetron (ZOFRAN) 8 MG tablet, Take 1 tablet (8 mg total) by mouth 2 (two) times daily as needed (Nausea or vomiting)., Disp: 30 tablet, Rfl: 1 .  Pancrelipase, Lip-Prot-Amyl, 40000-126000 units CPEP, Take 1 Dose by mouth in the morning, at noon, in the evening, and at bedtime., Disp: , Rfl:  .  pantoprazole (PROTONIX) 40 MG tablet, Take 40 mg by mouth daily., Disp: , Rfl:  .  prochlorperazine (COMPAZINE) 10 MG tablet, Take 1 tablet (10 mg total) by mouth every 6 (six) hours as needed (Nausea or vomiting)., Disp: 30 tablet, Rfl: 1 .  traZODone (DESYREL) 100 MG tablet, Take 100 mg by mouth at bedtime., Disp: , Rfl:  .  clorazepate (TRANXENE) 3.75 MG tablet, Take 3.75 mg by mouth daily as needed. (Patient not taking: No sig reported), Disp: , Rfl:  .  cyclobenzaprine (FLEXERIL) 10 MG tablet, TAKE 1 TABLET (10 MG TOTAL) BY MOUTH 3 (THREE) TIMES DAILY AS NEEDED FOR MUSCLE SPASMS. (Patient not taking: No sig reported), Disp: , Rfl:  .  metoCLOPramide (REGLAN) 5 MG tablet, Take 5 mg by mouth 3 (three) times daily before meals. (Patient not taking: No sig reported), Disp: , Rfl:   Physical exam:  Vitals:   05/23/20 1023  BP: (!) 169/63  Pulse: 60  Resp: 18  Temp: 98.5 F (36.9 C)  TempSrc: Tympanic  Weight: 115 lb 14.4 oz (52.6 kg)   Physical Exam HENT:     Head: Normocephalic and atraumatic.  Eyes:     Pupils: Pupils are equal, round, and reactive to light.  Cardiovascular:     Rate and Rhythm: Normal rate and regular rhythm.     Heart sounds: Normal heart  sounds.  Pulmonary:     Effort: Pulmonary effort is normal.     Breath sounds: Normal breath sounds.  Abdominal:     General: Bowel sounds are normal.     Palpations: Abdomen is soft.  Musculoskeletal:     Cervical back: Normal range of motion.  Skin:    General: Skin is warm and dry.  Neurological:     Mental Status: She is alert and oriented to person, place, and time.      CMP Latest Ref Rng & Units 05/23/2020  Glucose 70 - 99 mg/dL 127(H)  BUN 8 - 23 mg/dL 13  Creatinine 0.44 - 1.00 mg/dL 0.85  Sodium 135 - 145 mmol/L 138  Potassium 3.5 - 5.1 mmol/L 4.2  Chloride 98 - 111 mmol/L 102  CO2 22 - 32 mmol/L 26  Calcium 8.9 - 10.3 mg/dL 9.0  Total Protein 6.5 - 8.1 g/dL 6.2(L)  Total Bilirubin 0.3 - 1.2 mg/dL 1.3(H)  Alkaline Phos 38 - 126 U/L 66  AST 15 - 41 U/L 23  ALT 0 - 44 U/L 14   CBC Latest Ref Rng & Units 05/23/2020  WBC 4.0 - 10.5 K/uL 6.0  Hemoglobin 12.0 - 15.0 g/dL 11.1(L)  Hematocrit 36.0 - 46.0 % 33.7(L)  Platelets 150 - 400 K/uL 234    No images are attached to the encounter.  No results found.   Assessment and plan- Patient is a 80 y.o. female withpancreatic adenocarcinoma stage IBT2 N0 M0 s/p Whipple surgery.  She is here for on treatment assessment prior to cycle 3-day 1 of adjuvant gemcitabine chemotherapy  Labs okay to proceed with cycle 3-day 1 of gemcitabine chemotherapy today.  She will return tomorrow for a Udenyca injection.  She will directly proceed for cycle 3-day 8 of gemcitabine next week.  She will then see Dr. Janese Banks back in 3 weeks.   She appears to be tolerating treatments well so far except for some ongoing fatigue and nausea.  Admits to forgetting to take her antiemetics.  We discussed compliance with antiemetics especially prior to eating meals.  She prefers Zofran over Compazine.  Patient can rotate Compazine and Zofran if nausea unrelieved by one over the other.  Insomnia: Stable on 0.5 mg Ativan nightly.  She has concerns about  becoming physically addicted to Ativan given she is taking this nightly.  I offered her some reassurance  hoping that her insomnia is only temporary.  Weight loss: Appetite is stable per patient.  We will continue to monitor her weight.  Tailbone pain: This has been a chronic issue and likely exacerbated after weight loss from pancreatic surgery.  Recommend conservative measures including trying to find a cushion which would relieve the pressure from her buttocks.  Elevated bilirubin: Total bili 1.3 today.  Previously 0.9. Monitor.   Disposition: RTC as scheduled.  Greater than 50% was spent in counseling and coordination of care with this patient including but not limited to discussion of the relevant topics above (See A&P) including, but not limited to diagnosis and management of acute and chronic medical conditions.   Visit Diagnosis 1. Malignant neoplasm of head of pancreas [facility])     Faythe Casa, NP 05/23/2020 10:58 AM  [facility] [phone] 05/23/2020 10:58 AM

## 2020-05-24 ENCOUNTER — Ambulatory Visit: Payer: Medicare Other

## 2020-05-24 ENCOUNTER — Inpatient Hospital Stay: Payer: Medicare Other

## 2020-05-24 ENCOUNTER — Other Ambulatory Visit: Payer: Medicare Other

## 2020-05-24 ENCOUNTER — Other Ambulatory Visit: Payer: Self-pay

## 2020-05-24 ENCOUNTER — Ambulatory Visit: Payer: Medicare Other | Admitting: Oncology

## 2020-05-24 DIAGNOSIS — Z5111 Encounter for antineoplastic chemotherapy: Secondary | ICD-10-CM | POA: Diagnosis not present

## 2020-05-24 DIAGNOSIS — C250 Malignant neoplasm of head of pancreas: Secondary | ICD-10-CM

## 2020-05-24 LAB — CANCER ANTIGEN 19-9: CA 19-9: 10 U/mL (ref 0–35)

## 2020-05-24 MED ORDER — PEGFILGRASTIM-CBQV 6 MG/0.6ML ~~LOC~~ SOSY
6.0000 mg | PREFILLED_SYRINGE | Freq: Once | SUBCUTANEOUS | Status: AC
  Administered 2020-05-24: 6 mg via SUBCUTANEOUS
  Filled 2020-05-24: qty 0.6

## 2020-05-30 ENCOUNTER — Telehealth: Payer: Self-pay | Admitting: *Deleted

## 2020-05-30 ENCOUNTER — Inpatient Hospital Stay: Payer: Medicare Other

## 2020-05-30 ENCOUNTER — Telehealth: Payer: Self-pay

## 2020-05-30 NOTE — Telephone Encounter (Signed)
05/30/2020- called patient to see how she was doing since she cancelled apt for tomorrow, She said, she just was getting over a UTI And feeling the side effect of the [place]- she  States her legs and back have been hurting. [facility]

## 2020-05-30 NOTE — Telephone Encounter (Addendum)
Nutrition  Called patient at scheduled nutrition appointment follow-up and no answer. No option to leave message.  Arlene Genova B. Zenia Resides, [place], [place] Registered Dietitian [phone] (mobile)

## 2020-05-30 NOTE — Telephone Encounter (Signed)
FYI...  Pt called wanting to have her 05/31/20 appts for [facility] labs/MD/Gemcitabine R/S due to not feeling well. Pt is requesting a call back.. Dr. Janese Banks team was made aware.

## 2020-05-31 ENCOUNTER — Inpatient Hospital Stay: Payer: Medicare Other

## 2020-06-03 ENCOUNTER — Telehealth: Payer: Self-pay | Admitting: *Deleted

## 2020-06-03 NOTE — Telephone Encounter (Signed)
Patient called requesting a return call to discuss changing her injection to something different and something about insurance coverage. I spoke with Aleen Sells who said that Dr Janese Banks had said she was going to call patient.Marland Kitchen

## 2020-06-06 ENCOUNTER — Inpatient Hospital Stay (HOSPITAL_BASED_OUTPATIENT_CLINIC_OR_DEPARTMENT_OTHER): Payer: Medicare Other

## 2020-06-06 ENCOUNTER — Inpatient Hospital Stay: Payer: Medicare Other

## 2020-06-06 ENCOUNTER — Other Ambulatory Visit: Payer: Self-pay

## 2020-06-06 ENCOUNTER — Encounter: Payer: Self-pay | Admitting: Oncology

## 2020-06-06 ENCOUNTER — Inpatient Hospital Stay (HOSPITAL_BASED_OUTPATIENT_CLINIC_OR_DEPARTMENT_OTHER): Payer: Medicare Other | Admitting: Oncology

## 2020-06-06 ENCOUNTER — Other Ambulatory Visit: Payer: Self-pay | Admitting: *Deleted

## 2020-06-06 VITALS — BP 169/57 | HR 65 | Temp 98.5°F | Resp 16 | Ht 63.0 in | Wt 115.0 lb

## 2020-06-06 DIAGNOSIS — Z5111 Encounter for antineoplastic chemotherapy: Secondary | ICD-10-CM

## 2020-06-06 DIAGNOSIS — C250 Malignant neoplasm of head of pancreas: Secondary | ICD-10-CM

## 2020-06-06 DIAGNOSIS — R110 Nausea: Secondary | ICD-10-CM

## 2020-06-06 LAB — CBC WITH DIFFERENTIAL/PLATELET
Abs Immature Granulocytes: 0.2 10*3/uL — ABNORMAL HIGH (ref 0.00–0.07)
Basophils Absolute: 0.1 10*3/uL (ref 0.0–0.1)
Basophils Relative: 1 %
Eosinophils Absolute: 0.3 10*3/uL (ref 0.0–0.5)
Eosinophils Relative: 2 %
HCT: 32.6 % — ABNORMAL LOW (ref 36.0–46.0)
Hemoglobin: 10.8 g/dL — ABNORMAL LOW (ref 12.0–15.0)
Immature Granulocytes: 1 %
Lymphocytes Relative: 6 %
Lymphs Abs: 0.9 10*3/uL (ref 0.7–4.0)
MCH: 30.3 pg (ref 26.0–34.0)
MCHC: 33.1 g/dL (ref 30.0–36.0)
MCV: 91.6 fL (ref 80.0–100.0)
Monocytes Absolute: 1 10*3/uL (ref 0.1–1.0)
Monocytes Relative: 7 %
Neutro Abs: 12.3 10*3/uL — ABNORMAL HIGH (ref 1.7–7.7)
Neutrophils Relative %: 83 %
Platelets: 278 10*3/uL (ref 150–400)
RBC: 3.56 MIL/uL — ABNORMAL LOW (ref 3.87–5.11)
RDW: 18.3 % — ABNORMAL HIGH (ref 11.5–15.5)
WBC: 14.6 10*3/uL — ABNORMAL HIGH (ref 4.0–10.5)
nRBC: 0 % (ref 0.0–0.2)

## 2020-06-06 LAB — COMPREHENSIVE METABOLIC PANEL
ALT: 13 U/L (ref 0–44)
AST: 24 U/L (ref 15–41)
Albumin: 3.8 g/dL (ref 3.5–5.0)
Alkaline Phosphatase: 117 U/L (ref 38–126)
Anion gap: 9 (ref 5–15)
BUN: 13 mg/dL (ref 8–23)
CO2: 26 mmol/L (ref 22–32)
Calcium: 8.7 mg/dL — ABNORMAL LOW (ref 8.9–10.3)
Chloride: 101 mmol/L (ref 98–111)
Creatinine, Ser: 0.85 mg/dL (ref 0.44–1.00)
GFR, Estimated: >60 mL/min (ref >60)
Glucose, Bld: 133 mg/dL — ABNORMAL HIGH (ref 70–99)
Potassium: 4.2 mmol/L (ref 3.5–5.1)
Sodium: 136 mmol/L (ref 135–145)
Total Bilirubin: 1.3 mg/dL — ABNORMAL HIGH (ref 0.3–1.2)
Total Protein: 6.6 g/dL (ref 6.5–8.1)

## 2020-06-06 MED ORDER — HEPARIN SOD (PORK) LOCK FLUSH 100 UNIT/ML IV SOLN
500.0000 [IU] | Freq: Once | INTRAVENOUS | Status: AC
  Administered 2020-06-06: 500 [IU] via INTRAVENOUS
  Filled 2020-06-06: qty 5

## 2020-06-06 MED ORDER — HEPARIN SOD (PORK) LOCK FLUSH 100 UNIT/ML IV SOLN
INTRAVENOUS | Status: AC
  Filled 2020-06-06: qty 5

## 2020-06-06 MED ORDER — SODIUM CHLORIDE 0.9% FLUSH
10.0000 mL | INTRAVENOUS | Status: AC | PRN
  Administered 2020-06-06: 10 mL via INTRAVENOUS
  Filled 2020-06-06: qty 10

## 2020-06-06 MED ORDER — PROCHLORPERAZINE MALEATE 10 MG PO TABS
10.0000 mg | ORAL_TABLET | Freq: Once | ORAL | Status: AC
  Administered 2020-06-06: 10 mg via ORAL
  Filled 2020-06-06: qty 1

## 2020-06-06 MED ORDER — SODIUM CHLORIDE 0.9 % IV SOLN
800.0000 mg/m2 | Freq: Once | INTRAVENOUS | Status: AC
  Administered 2020-06-06: 1254 mg via INTRAVENOUS
  Filled 2020-06-06: qty 26.3

## 2020-06-06 MED ORDER — ONDANSETRON HCL 4 MG/2ML IJ SOLN
4.0000 mg | Freq: Once | INTRAMUSCULAR | Status: AC
  Administered 2020-06-06: 4 mg via INTRAVENOUS
  Filled 2020-06-06: qty 2

## 2020-06-06 MED ORDER — SODIUM CHLORIDE 0.9 % IV SOLN
INTRAVENOUS | Status: DC
  Filled 2020-06-06: qty 250

## 2020-06-06 MED ORDER — LORAZEPAM 0.5 MG PO TABS: 0.5000 mg | ORAL_TABLET | Freq: Four times a day (QID) | ORAL | 0 refills | Status: DC | PRN

## 2020-06-06 NOTE — Progress Notes (Signed)
Hematology/Oncology Consult note [facility]  Telephone:([phone] Fax:([phone]  Patient Care Team: Baxter Hire, MD as PCP - General (Internal Medicine)   Name of the patient: Emily Harding  [id number]  01/17/1941   Date of visit: 06/06/20  Diagnosis- pancreatic adenocarcinoma stage Ib T2 N0 M0 s/p Whipple surgery  Chief complaint/ Reason for visit-on treatment assessment prior to cycle 4-day 1 of adjuvant gemcitabine chemotherapy  Heme/Onc history: patient is a 80 year old female with an oncology history as follows: 1.On 11/22/2019 she presented with left lower quadrant abdominal pain and persistent nausea. CT abdomen and pelvis showed a 3.1 x 2.3 cm mass in the head of the pancreas with diffuse pancreatic ductal dilatation.CA 19-9 elevated at 166 2.EUS showed 2.0 x 1.9 cm mass in the head of pancreas with no vessel involvement. UT2N0. Pathology showed adenocarcinoma 3.CT chest showed 2 to 3 mm lung nodules with no evidence of distant metastatic disease. In October 2021 patient underwent Whipple resection with pathology demonstrating 3.1 cm adenocarcinoma with mucinous features. Uncinate margin positive for tumor. Tumor approaches less than 1 mm within cautery. Positive for PNI. 0 out of 18 lymph nodes positive for malignancy. 4.Patient evaluated by medical oncology at [facility] and was recommended adjuvant chemotherapy followed by chemoradiation  Multiple adjuvant regimens including gem Abraxane, gemcitabine and Xeloda and gemcitabine single agent discussed with patient and patient decided to proceed with single agent gemcitabine starting 04/11/2020  Interval history-after receiving Udenyca with the last chemotherapy patient reported significant fatigue as well as generalized Body aches and joint pain.  She reports feeling better today but has still not gotten back to her baseline.  Reports intermittent nausea and the Compazine is less  effective as compared to Zofran.  ECOG PS- 1-2 Pain scale- 0   Review of systems- Review of Systems  Constitutional: Positive for malaise/fatigue. Negative for chills, fever and weight loss.  HENT: Negative for congestion, ear discharge and nosebleeds.   Eyes: Negative for blurred vision.  Respiratory: Negative for cough, hemoptysis, sputum production, shortness of breath and wheezing.   Cardiovascular: Negative for chest pain, palpitations, orthopnea and claudication.  Gastrointestinal: Negative for abdominal pain, blood in stool, constipation, diarrhea, heartburn, melena, nausea and vomiting.  Genitourinary: Negative for dysuria, flank pain, frequency, hematuria and urgency.  Musculoskeletal: Negative for back pain, joint pain and myalgias.  Skin: Negative for rash.  Neurological: Negative for dizziness, tingling, focal weakness, seizures, weakness and headaches.  Endo/Heme/Allergies: Does not bruise/bleed easily.  Psychiatric/Behavioral: Negative for depression and suicidal ideas. The patient does not have insomnia.        Allergies  Allergen Reactions  . Hydromorphone Hcl Itching  . Azithromycin   . Codeine Sulfate Other (See Comments)  . Doxycycline Calcium Other (See Comments)  . Duloxetine Nausea Only  . Erythromycin Ethylsuccinate Other (See Comments)  . Macrolides And Ketolides Other (See Comments)  . Nitrofurantoin     Other reaction(s): Unknown Other reaction(s): Abdominal Pain Other reaction(s): Unknown  . Sulfa Antibiotics      Past Medical History:  Diagnosis Date  . Anxiety   . Arthritis   . Breast cancer ([place]) 1993   RT MASTECTOMY  . Heartburn   . HTN (hypertension)   . Pancreatic cancer ([place])   . Personal history of chemotherapy   . S/P chemotherapy, time since greater than 12 weeks 1993   BREAST CA     Past Surgical History:  Procedure Laterality Date  . ABDOMINAL HYSTERECTOMY    .  colon polyp removal    . GALLBLADDER SURGERY    .  LAPAROSCOPIC OOPHERECTOMY  1978  . MASTECTOMY Right 1993   BREAST CA  . PANCREAS SURGERY N/A 01/04/2020   Per patient removed head of pancreas due to cancer  . PORTA CATH INSERTION N/A 04/04/2020   Procedure: PORTA CATH INSERTION;  Surgeon: Algernon Huxley, MD;  Location: [place] [facility];  Service: Cardiovascular;  Laterality: N/A;  . removal of fallopian tubes  1989    Social History   Socioeconomic History  . Marital status: Widowed    Spouse name: Not on file  . Number of children: Not on file  . Years of education: Not on file  . Highest education level: Not on file  Occupational History  . Not on file  Tobacco Use  . Smoking status: Never Smoker  . Smokeless tobacco: Never Used  Vaping Use  . Vaping Use: Never used  Substance and Sexual Activity  . Alcohol use: No  . Drug use: No  . Sexual activity: Not Currently  Other Topics Concern  . Not on file  Social History Narrative  . Not on file   Social Determinants of Health   Financial Resource Strain: Not on file  Food Insecurity: Not on file  Transportation Needs: Not on file  Physical Activity: Not on file  Stress: Not on file  Social Connections: Not on file  Intimate Partner Violence: Not on file    Family History  Problem Relation Age of Onset  . Breast cancer Neg Hx   . Kidney cancer Neg Hx   . Bladder Cancer Neg Hx   . Prostate cancer Neg Hx      Current Outpatient Medications:  .  aspirin EC 81 MG tablet, Take 81 mg by mouth daily., Disp: , Rfl:  .  atenolol (TENORMIN) 50 MG tablet, Take 50 mg by mouth 2 (two) times daily., Disp: , Rfl:  .  celecoxib (CELEBREX) 200 MG capsule, Take 200 mg by mouth daily., Disp: , Rfl:  .  clorazepate (TRANXENE) 3.75 MG tablet, Take 3.75 mg by mouth daily as needed., Disp: , Rfl:  .  gabapentin (NEURONTIN) 400 MG capsule, Take 400 mg by mouth 3 (three) times daily., Disp: , Rfl:  .  losartan (COZAAR) 100 MG tablet, Take 100 mg by mouth daily., Disp: , Rfl:  .   Multiple Vitamin (MULTI-VITAMINS) TABS, Take 1 tablet by mouth daily., Disp: , Rfl:  .  ondansetron (ZOFRAN) 8 MG tablet, Take 1 tablet (8 mg total) by mouth 2 (two) times daily as needed (Nausea or vomiting)., Disp: 30 tablet, Rfl: 1 .  Pancrelipase, Lip-Prot-Amyl, 40000-126000 units CPEP, Take 1 Dose by mouth in the morning, at noon, in the evening, and at bedtime., Disp: , Rfl:  .  pantoprazole (PROTONIX) 40 MG tablet, Take 40 mg by mouth daily., Disp: , Rfl:  .  prochlorperazine (COMPAZINE) 10 MG tablet, Take 1 tablet (10 mg total) by mouth every 6 (six) hours as needed (Nausea or vomiting)., Disp: 30 tablet, Rfl: 1 .  traZODone (DESYREL) 100 MG tablet, Take 100 mg by mouth at bedtime., Disp: , Rfl:  .  cyclobenzaprine (FLEXERIL) 10 MG tablet, TAKE 1 TABLET (10 MG TOTAL) BY MOUTH 3 (THREE) TIMES DAILY AS NEEDED FOR MUSCLE SPASMS. (Patient not taking: No sig reported), Disp: , Rfl:  .  lidocaine-prilocaine (EMLA) cream, Apply small amount of cream over port site 1 1/2 hours before each treatment, place saran wrap  over cream to protect clothin (Patient not taking: Reported on 06/06/2020), Disp: 30 g, Rfl: 3 .  LORazepam (ATIVAN) 0.5 MG tablet, Take 1 tablet (0.5 mg total) by mouth every 6 (six) hours as needed (Nausea or vomiting)., Disp: 30 tablet, Rfl: 0 No current facility-administered medications for this visit.  Facility-Administered Medications Ordered in Other Visits:  .  0.9 %  sodium chloride infusion, , Intravenous, Continuous, Sindy Guadeloupe, MD, Stopped at 06/06/20 1322 .  sodium chloride flush (NS) 0.9 % injection 10 mL, 10 mL, Intravenous, PRN, Sindy Guadeloupe, MD, 10 mL at 06/06/20 1039  Physical exam:  Vitals:   06/06/20 1049  BP: (!) 169/57  Pulse: 65  Resp: 16  Temp: 98.5 F (36.9 C)  TempSrc: Oral  Weight: 115 lb (52.2 kg)  Height: 5\' 3"  (1.6 m)   Physical Exam Constitutional:      Comments: Sitting in a wheelchair.  Appears in no acute distress  Cardiovascular:      Rate and Rhythm: Normal rate and regular rhythm.     Heart sounds: Normal heart sounds.  Pulmonary:     Effort: Pulmonary effort is normal.     Breath sounds: Normal breath sounds.  Abdominal:     General: Bowel sounds are normal.     Palpations: Abdomen is soft.  Skin:    General: Skin is warm and dry.  Neurological:     Mental Status: She is alert and oriented to person, place, and time.      CMP Latest Ref Rng & Units 06/06/2020  Glucose 70 - 99 mg/dL 133(H)  BUN 8 - 23 mg/dL 13  Creatinine 0.44 - 1.00 mg/dL 0.85  Sodium 135 - 145 mmol/L 136  Potassium 3.5 - 5.1 mmol/L 4.2  Chloride 98 - 111 mmol/L 101  CO2 22 - 32 mmol/L 26  Calcium 8.9 - 10.3 mg/dL 8.7(L)  Total Protein 6.5 - 8.1 g/dL 6.6  Total Bilirubin 0.3 - 1.2 mg/dL 1.3(H)  Alkaline Phos 38 - 126 U/L 117  AST 15 - 41 U/L 24  ALT 0 - 44 U/L 13   CBC Latest Ref Rng & Units 06/06/2020  WBC 4.0 - 10.5 K/uL 14.6(H)  Hemoglobin 12.0 - 15.0 g/dL 10.8(L)  Hematocrit 36.0 - 46.0 % 32.6(L)  Platelets 150 - 400 K/uL 278     Assessment and plan- Patient is a 80 y.o. female withpancreatic adenocarcinoma stage IBT2 N0 M0 s/p Whipple surgery.    She is here for on treatment assessment prior to cycle 4-day 1 of adjuvant gemcitabine chemotherapy  Counts okay to proceed with cycle 4-day 1 of adjuvant gemcitabine chemotherapy today.  She will directly proceed for cycle 4-day 8 next week but will not receive growth factor support.  Patient had significant side effects with Udenyca but I am concerned that even if I switch her to some other growth factor support such as Neulasta as she Mil[place] of having similar side effects.  I will try to give her chemotherapy if possible without growth factor support but consider it down the line if her neutropenia worsens.  Patient reports that she will have a follow-up at [facility] as well and will be getting repeat imaging there in April  Nausea: She will continue as needed Zofran at this time but if  it remains uncontrolled I will consider adding Zyprexa to her regimen   Chemo-induced anemia: Stable.  Continue to monitor Visit Diagnosis 1. Malignant neoplasm of head of pancreas ([place])   2.  Encounter for antineoplastic chemotherapy      Dr. Randa Evens, MD, MPH [facility] at [facility] [phone] 06/06/2020 6:37 PM

## 2020-06-06 NOTE — Progress Notes (Signed)
Pt having worse nausea. She states that she took compazine 8:30 and it seems it is not helping good today. She tries to eat meal then snack. She has not got back to herself ever since she got the shot. She does not want to get another one.

## 2020-06-06 NOTE — Progress Notes (Signed)
Patient tolerated Gemzar chemo infusion well today, no concerns voiced. Patient also received IV Zofran and states she feels better, discharged. Stable.

## 2020-06-07 ENCOUNTER — Other Ambulatory Visit: Payer: Medicare Other

## 2020-06-07 ENCOUNTER — Ambulatory Visit: Payer: Medicare Other

## 2020-06-14 ENCOUNTER — Ambulatory Visit: Payer: Medicare Other

## 2020-06-14 ENCOUNTER — Ambulatory Visit: Payer: Medicare Other | Admitting: Oncology

## 2020-06-14 ENCOUNTER — Other Ambulatory Visit: Payer: Medicare Other

## 2020-06-20 ENCOUNTER — Other Ambulatory Visit: Payer: Self-pay

## 2020-06-20 ENCOUNTER — Other Ambulatory Visit: Payer: Medicare Other

## 2020-06-20 ENCOUNTER — Inpatient Hospital Stay: Payer: Medicare Other

## 2020-06-20 ENCOUNTER — Other Ambulatory Visit: Payer: Self-pay | Admitting: Oncology

## 2020-06-20 ENCOUNTER — Ambulatory Visit: Payer: Medicare Other

## 2020-06-20 VITALS — BP 160/64 | HR 66 | Temp 96.1°F | Resp 18 | Wt 115.0 lb

## 2020-06-20 DIAGNOSIS — C250 Malignant neoplasm of head of pancreas: Secondary | ICD-10-CM

## 2020-06-20 DIAGNOSIS — Z5111 Encounter for antineoplastic chemotherapy: Secondary | ICD-10-CM | POA: Diagnosis not present

## 2020-06-20 LAB — COMPREHENSIVE METABOLIC PANEL
ALT: 14 U/L (ref 0–44)
AST: 23 U/L (ref 15–41)
Albumin: 3.7 g/dL (ref 3.5–5.0)
Alkaline Phosphatase: 68 U/L (ref 38–126)
Anion gap: 11 (ref 5–15)
BUN: 14 mg/dL (ref 8–23)
CO2: 25 mmol/L (ref 22–32)
Calcium: 8.6 mg/dL — ABNORMAL LOW (ref 8.9–10.3)
Chloride: 101 mmol/L (ref 98–111)
Creatinine, Ser: 0.87 mg/dL (ref 0.44–1.00)
GFR, Estimated: >60 mL/min (ref >60)
Glucose, Bld: 149 mg/dL — ABNORMAL HIGH (ref 70–99)
Potassium: 4 mmol/L (ref 3.5–5.1)
Sodium: 137 mmol/L (ref 135–145)
Total Bilirubin: 1.1 mg/dL (ref 0.3–1.2)
Total Protein: 6.3 g/dL — ABNORMAL LOW (ref 6.5–8.1)

## 2020-06-20 LAB — CBC WITH DIFFERENTIAL/PLATELET
Abs Immature Granulocytes: 0.01 10*3/uL (ref 0.00–0.07)
Basophils Absolute: 0.1 10*3/uL (ref 0.0–0.1)
Basophils Relative: 1 %
Eosinophils Absolute: 0.3 10*3/uL (ref 0.0–0.5)
Eosinophils Relative: 5 %
HCT: 29 % — ABNORMAL LOW (ref 36.0–46.0)
Hemoglobin: 9.8 g/dL — ABNORMAL LOW (ref 12.0–15.0)
Immature Granulocytes: 0 %
Lymphocytes Relative: 11 %
Lymphs Abs: 0.6 10*3/uL — ABNORMAL LOW (ref 0.7–4.0)
MCH: 31.2 pg (ref 26.0–34.0)
MCHC: 33.8 g/dL (ref 30.0–36.0)
MCV: 92.4 fL (ref 80.0–100.0)
Monocytes Absolute: 0.5 10*3/uL (ref 0.1–1.0)
Monocytes Relative: 9 %
Neutro Abs: 4 10*3/uL (ref 1.7–7.7)
Neutrophils Relative %: 74 %
Platelets: 241 10*3/uL (ref 150–400)
RBC: 3.14 MIL/uL — ABNORMAL LOW (ref 3.87–5.11)
RDW: 18.4 % — ABNORMAL HIGH (ref 11.5–15.5)
WBC: 5.4 10*3/uL (ref 4.0–10.5)
nRBC: 0 % (ref 0.0–0.2)

## 2020-06-20 MED ORDER — SODIUM CHLORIDE 0.9% FLUSH
10.0000 mL | INTRAVENOUS | Status: AC | PRN
  Filled 2020-06-20: qty 10

## 2020-06-20 MED ORDER — HEPARIN SOD (PORK) LOCK FLUSH 100 UNIT/ML IV SOLN
INTRAVENOUS | Status: AC
  Filled 2020-06-20: qty 5

## 2020-06-20 MED ORDER — HEPARIN SOD (PORK) LOCK FLUSH 100 UNIT/ML IV SOLN
500.0000 [IU] | Freq: Once | INTRAVENOUS | Status: AC | PRN
  Filled 2020-06-20: qty 5

## 2020-06-20 MED ORDER — SODIUM CHLORIDE 0.9% FLUSH
10.0000 mL | INTRAVENOUS | Status: AC | PRN
  Administered 2020-06-20: 10 mL
  Filled 2020-06-20: qty 10

## 2020-06-20 MED ORDER — HEPARIN SOD (PORK) LOCK FLUSH 100 UNIT/ML IV SOLN
250.0000 [IU] | Freq: Once | INTRAVENOUS | Status: AC | PRN
  Filled 2020-06-20: qty 5

## 2020-06-20 MED ORDER — SODIUM CHLORIDE 0.9 % IV SOLN: 800.0000 mg/m2 | Freq: Once | INTRAVENOUS | Status: DC

## 2020-06-20 MED ORDER — SODIUM CHLORIDE 0.9 % IV SOLN
Freq: Once | INTRAVENOUS | Status: AC
  Filled 2020-06-20: qty 250

## 2020-06-20 MED ORDER — COLD PACK MISC ONCOLOGY
1.0000 | Freq: Once | Status: AC | PRN
  Filled 2020-06-20: qty 1

## 2020-06-20 MED ORDER — PROCHLORPERAZINE MALEATE 10 MG PO TABS: 10.0000 mg | ORAL_TABLET | Freq: Once | ORAL | Status: DC

## 2020-06-20 MED ORDER — PROCHLORPERAZINE MALEATE 10 MG PO TABS
10.0000 mg | ORAL_TABLET | Freq: Once | ORAL | Status: AC
  Administered 2020-06-20: 10 mg via ORAL
  Filled 2020-06-20: qty 1

## 2020-06-20 MED ORDER — ALTEPLASE 2 MG IJ SOLR
2.0000 mg | Freq: Once | INTRAMUSCULAR | Status: AC | PRN
  Filled 2020-06-20: qty 2

## 2020-06-20 MED ORDER — SODIUM CHLORIDE 0.9% FLUSH
3.0000 mL | INTRAVENOUS | Status: AC | PRN
  Filled 2020-06-20: qty 3

## 2020-06-20 MED ORDER — SODIUM CHLORIDE 0.9 % IV SOLN
800.0000 mg/m2 | Freq: Once | INTRAVENOUS | Status: AC
  Administered 2020-06-20: 1254 mg via INTRAVENOUS
  Filled 2020-06-20: qty 26.3

## 2020-06-20 MED ORDER — HEPARIN SOD (PORK) LOCK FLUSH 100 UNIT/ML IV SOLN
500.0000 [IU] | Freq: Once | INTRAVENOUS | Status: AC | PRN
  Administered 2020-06-20: 500 [IU]
  Filled 2020-06-20: qty 5

## 2020-06-20 NOTE — Progress Notes (Signed)
Patient missed cycle 4 day 8 treatment.  MD to resume Day 1 treatment for 06/20/20

## 2020-06-27 ENCOUNTER — Ambulatory Visit: Payer: Medicare Other | Admitting: Oncology

## 2020-06-27 ENCOUNTER — Ambulatory Visit: Payer: Medicare Other

## 2020-06-27 ENCOUNTER — Other Ambulatory Visit: Payer: Medicare Other

## 2020-06-28 ENCOUNTER — Inpatient Hospital Stay: Payer: Medicare Other

## 2020-06-28 ENCOUNTER — Inpatient Hospital Stay: Payer: Medicare Other | Attending: Oncology

## 2020-06-28 ENCOUNTER — Encounter: Payer: Self-pay | Admitting: Oncology

## 2020-06-28 ENCOUNTER — Other Ambulatory Visit: Payer: Self-pay

## 2020-06-28 ENCOUNTER — Inpatient Hospital Stay: Payer: Medicare Other | Admitting: Oncology

## 2020-06-28 VITALS — BP 166/60 | HR 63 | Temp 97.5°F | Resp 18 | Wt 113.8 lb

## 2020-06-28 DIAGNOSIS — Z7982 Long term (current) use of aspirin: Secondary | ICD-10-CM | POA: Diagnosis not present

## 2020-06-28 DIAGNOSIS — Z9011 Acquired absence of right breast and nipple: Secondary | ICD-10-CM | POA: Diagnosis not present

## 2020-06-28 DIAGNOSIS — C250 Malignant neoplasm of head of pancreas: Secondary | ICD-10-CM | POA: Diagnosis not present

## 2020-06-28 DIAGNOSIS — Z79899 Other long term (current) drug therapy: Secondary | ICD-10-CM | POA: Insufficient documentation

## 2020-06-28 DIAGNOSIS — Z5111 Encounter for antineoplastic chemotherapy: Secondary | ICD-10-CM

## 2020-06-28 DIAGNOSIS — D649 Anemia, unspecified: Secondary | ICD-10-CM | POA: Insufficient documentation

## 2020-06-28 DIAGNOSIS — Z90710 Acquired absence of both cervix and uterus: Secondary | ICD-10-CM | POA: Insufficient documentation

## 2020-06-28 DIAGNOSIS — I10 Essential (primary) hypertension: Secondary | ICD-10-CM | POA: Insufficient documentation

## 2020-06-28 DIAGNOSIS — Z853 Personal history of malignant neoplasm of breast: Secondary | ICD-10-CM | POA: Diagnosis not present

## 2020-06-28 LAB — COMPREHENSIVE METABOLIC PANEL
ALT: 19 U/L (ref 0–44)
AST: 30 U/L (ref 15–41)
Albumin: 4 g/dL (ref 3.5–5.0)
Alkaline Phosphatase: 62 U/L (ref 38–126)
Anion gap: 7 (ref 5–15)
BUN: 12 mg/dL (ref 8–23)
CO2: 27 mmol/L (ref 22–32)
Calcium: 8.7 mg/dL — ABNORMAL LOW (ref 8.9–10.3)
Chloride: 104 mmol/L (ref 98–111)
Creatinine, Ser: 0.86 mg/dL (ref 0.44–1.00)
GFR, Estimated: >60 mL/min (ref >60)
Glucose, Bld: 140 mg/dL — ABNORMAL HIGH (ref 70–99)
Potassium: 4 mmol/L (ref 3.5–5.1)
Sodium: 138 mmol/L (ref 135–145)
Total Bilirubin: 0.5 mg/dL (ref 0.3–1.2)
Total Protein: 6.4 g/dL — ABNORMAL LOW (ref 6.5–8.1)

## 2020-06-28 LAB — CBC WITH DIFFERENTIAL/PLATELET
Abs Immature Granulocytes: 0 10*3/uL (ref 0.00–0.07)
Basophils Absolute: 0 10*3/uL (ref 0.0–0.1)
Basophils Relative: 1 %
Eosinophils Absolute: 0.1 10*3/uL (ref 0.0–0.5)
Eosinophils Relative: 4 %
HCT: 28.7 % — ABNORMAL LOW (ref 36.0–46.0)
Hemoglobin: 9.8 g/dL — ABNORMAL LOW (ref 12.0–15.0)
Immature Granulocytes: 0 %
Lymphocytes Relative: 24 %
Lymphs Abs: 0.6 10*3/uL — ABNORMAL LOW (ref 0.7–4.0)
MCH: 32.2 pg (ref 26.0–34.0)
MCHC: 34.1 g/dL (ref 30.0–36.0)
MCV: 94.4 fL (ref 80.0–100.0)
Monocytes Absolute: 0.3 10*3/uL (ref 0.1–1.0)
Monocytes Relative: 12 %
Neutro Abs: 1.5 10*3/uL — ABNORMAL LOW (ref 1.7–7.7)
Neutrophils Relative %: 59 %
Platelets: 318 10*3/uL (ref 150–400)
RBC: 3.04 MIL/uL — ABNORMAL LOW (ref 3.87–5.11)
RDW: 17.5 % — ABNORMAL HIGH (ref 11.5–15.5)
WBC: 2.6 10*3/uL — ABNORMAL LOW (ref 4.0–10.5)
nRBC: 0 % (ref 0.0–0.2)

## 2020-06-28 MED ORDER — HEPARIN SOD (PORK) LOCK FLUSH 100 UNIT/ML IV SOLN
500.0000 [IU] | Freq: Once | INTRAVENOUS | Status: AC | PRN
  Administered 2020-06-28: 500 [IU]
  Filled 2020-06-28: qty 5

## 2020-06-28 MED ORDER — HEPARIN SOD (PORK) LOCK FLUSH 100 UNIT/ML IV SOLN
INTRAVENOUS | Status: AC
  Filled 2020-06-28: qty 5

## 2020-06-28 MED ORDER — SODIUM CHLORIDE 0.9 % IV SOLN
Freq: Once | INTRAVENOUS | Status: AC
  Filled 2020-06-28: qty 250

## 2020-06-28 MED ORDER — PROCHLORPERAZINE MALEATE 10 MG PO TABS
10.0000 mg | ORAL_TABLET | Freq: Once | ORAL | Status: AC
  Administered 2020-06-28: 10 mg via ORAL
  Filled 2020-06-28: qty 1

## 2020-06-28 MED ORDER — SODIUM CHLORIDE 0.9% FLUSH
10.0000 mL | Freq: Once | INTRAVENOUS | Status: AC
  Administered 2020-06-28: 10 mL via INTRAVENOUS
  Filled 2020-06-28: qty 10

## 2020-06-28 MED ORDER — SODIUM CHLORIDE 0.9 % IV SOLN
800.0000 mg/m2 | Freq: Once | INTRAVENOUS | Status: AC
  Administered 2020-06-28: 1254 mg via INTRAVENOUS
  Filled 2020-06-28: qty 26.3

## 2020-06-28 MED ORDER — LORAZEPAM 0.5 MG PO TABS: 0.5000 mg | ORAL_TABLET | Freq: Four times a day (QID) | ORAL | 0 refills | Status: DC | PRN

## 2020-06-28 NOTE — Progress Notes (Signed)
Nutrition Follow-up:  Patient with pancreatic cancer.  S/p whipple on 01/2020 at [facility].  Patient receiving adjuvant chemotherapy.   Met with patient during infusion.  Patient reports that she has been more nauseated.  Has been taking zofran with relief.  Patient reports having nausea about 4 times per week.  Reports that she eats about the same thing every day.  Tried the [employer] and orgain shake and made her nauseated.  Does not like juice and does not want to try juice type shakes.      Medications: reviewed  Labs: reviewed  Anthropometrics:   Weight 113 lb today  118 lb on 1/28 118 lb on 1/20 121 lb on 12/30   NUTRITION DIAGNOSIS:  Inadequate oral intake continues   INTERVENTION:  Encouraged patient to take nausea medication at least amount of nausea.   Discussed ways to add calories to diet as weight continues to decline.   Patient does not like any oral nutrition supplements.   Encouraged eating small frequent snacks/mini meals.     MONITORING, EVALUATION, GOAL: weight trends, intake   NEXT VISIT: Friday, May 6 during infusion  Demarian Epps B. Zenia Resides, [place], [place] Registered Dietitian [phone] (mobile)

## 2020-06-28 NOTE — Progress Notes (Signed)
Hematology/Oncology Consult note [facility]  [phone] [phone]  Patient Care Team: Baxter Hire, MD as PCP - General (Internal Medicine)   Name of the patient: Emily Harding  [id number]  01/08/1941   Date of visit: 06/28/20  Diagnosis- pancreatic adenocarcinoma stage Ib T2 N0 M0 s/p Whipple surgery   Chief complaint/ Reason for visit-on treatment assessment prior to cycle 5-day 1 of adjuvant gemcitabine chemotherapy  Heme/Onc history: patient is a 80 year old female with an oncology history as follows: 1.On 11/22/2019 she presented with left lower quadrant abdominal pain and persistent nausea. CT abdomen and pelvis showed a 3.1 x 2.3 cm mass in the head of the pancreas with diffuse pancreatic ductal dilatation.CA 19-9 elevated at 166 2.EUS showed 2.0 x 1.9 cm mass in the head of pancreas with no vessel involvement. UT2N0. Pathology showed adenocarcinoma 3.CT chest showed 2 to 3 mm lung nodules with no evidence of distant metastatic disease. In October 2021 patient underwent Whipple resection with pathology demonstrating 3.1 cm adenocarcinoma with mucinous features. Uncinate margin positive for tumor. Tumor approaches less than 1 mm within cautery. Positive for PNI. 0 out of 18 lymph nodes positive for malignancy. 4.Patient evaluated by medical oncology at [facility] and was recommended adjuvant chemotherapy followed by chemoradiation  Multiple adjuvant regimens including gem Abraxane, gemcitabine and Xeloda and gemcitabine single agent discussed with patient and patient decided to proceed with single agent gemcitabine starting 04/11/2020  Interval history-patient reports some self-limited nausea for which Zofran has been helping.  Energy levels are fair.  She lives alone and independent of her ADLs.  She does need assistance with IADLs.  No recent falls  ECOG PS- 1 Pain scale- 0   Review of systems- Review of Systems   Constitutional: Positive for malaise/fatigue. Negative for chills, fever and weight loss.  HENT: Negative for congestion, ear discharge and nosebleeds.   Eyes: Negative for blurred vision.  Respiratory: Negative for cough, hemoptysis, sputum production, shortness of breath and wheezing.   Cardiovascular: Negative for chest pain, palpitations, orthopnea and claudication.  Gastrointestinal: Positive for nausea. Negative for abdominal pain, blood in stool, constipation, diarrhea, heartburn, melena and vomiting.  Genitourinary: Negative for dysuria, flank pain, frequency, hematuria and urgency.  Musculoskeletal: Negative for back pain, joint pain and myalgias.  Skin: Negative for rash.  Neurological: Negative for dizziness, tingling, focal weakness, seizures, weakness and headaches.  Endo/Heme/Allergies: Does not bruise/bleed easily.  Psychiatric/Behavioral: Negative for depression and suicidal ideas. The patient does not have insomnia.        Allergies  Allergen Reactions  . Hydromorphone Hcl Itching  . Azithromycin   . Codeine Sulfate Other (See Comments)  . Doxycycline Calcium Other (See Comments)  . Duloxetine Nausea Only  . Erythromycin Ethylsuccinate Other (See Comments)  . Macrolides And Ketolides Other (See Comments)  . Nitrofurantoin     Other reaction(s): Unknown Other reaction(s): Abdominal Pain Other reaction(s): Unknown  . Sulfa Antibiotics      Past Medical History:  Diagnosis Date  . Anxiety   . Arthritis   . Breast cancer ([place]) 1993   RT MASTECTOMY  . Heartburn   . HTN (hypertension)   . Pancreatic cancer ([place])   . Personal history of chemotherapy   . S/P chemotherapy, time since greater than 12 weeks 1993   BREAST CA     Past Surgical History:  Procedure Laterality Date  . ABDOMINAL HYSTERECTOMY    . colon polyp removal    . GALLBLADDER  SURGERY    . LAPAROSCOPIC OOPHERECTOMY  1978  . MASTECTOMY Right 1993   BREAST CA  . PANCREAS SURGERY N/A  01/04/2020   Per patient removed head of pancreas due to cancer  . PORTA CATH INSERTION N/A 04/04/2020   Procedure: PORTA CATH INSERTION;  Surgeon: Algernon Huxley, MD;  Location: [place] [facility];  Service: Cardiovascular;  Laterality: N/A;  . removal of fallopian tubes  1989    Social History   Socioeconomic History  . Marital status: Widowed    Spouse name: Not on file  . Number of children: Not on file  . Years of education: Not on file  . Highest education level: Not on file  Occupational History  . Not on file  Tobacco Use  . Smoking status: Never Smoker  . Smokeless tobacco: Never Used  Vaping Use  . Vaping Use: Never used  Substance and Sexual Activity  . Alcohol use: No  . Drug use: No  . Sexual activity: Not Currently  Other Topics Concern  . Not on file  Social History Narrative  . Not on file   Social Determinants of Health   Financial Resource Strain: Not on file  Food Insecurity: Not on file  Transportation Needs: Not on file  Physical Activity: Not on file  Stress: Not on file  Social Connections: Not on file  Intimate Partner Violence: Not on file    Family History  Problem Relation Age of Onset  . Breast cancer Neg Hx   . Kidney cancer Neg Hx   . Bladder Cancer Neg Hx   . Prostate cancer Neg Hx      Current Outpatient Medications:  .  aspirin EC 81 MG tablet, Take 81 mg by mouth daily., Disp: , Rfl:  .  atenolol (TENORMIN) 50 MG tablet, Take 50 mg by mouth 2 (two) times daily., Disp: , Rfl:  .  celecoxib (CELEBREX) 200 MG capsule, Take 200 mg by mouth daily., Disp: , Rfl:  .  clorazepate (TRANXENE) 3.75 MG tablet, Take 3.75 mg by mouth daily as needed., Disp: , Rfl:  .  cyclobenzaprine (FLEXERIL) 10 MG tablet, TAKE 1 TABLET (10 MG TOTAL) BY MOUTH 3 (THREE) TIMES DAILY AS NEEDED FOR MUSCLE SPASMS. (Patient not taking: No sig reported), Disp: , Rfl:  .  gabapentin (NEURONTIN) 400 MG capsule, Take 400 mg by mouth 3 (three) times daily., Disp: ,  Rfl:  .  lidocaine-prilocaine (EMLA) cream, Apply small amount of cream over port site 1 1/2 hours before each treatment, place saran wrap over cream to protect clothin (Patient not taking: Reported on 06/06/2020), Disp: 30 g, Rfl: 3 .  LORazepam (ATIVAN) 0.5 MG tablet, Take 1 tablet (0.5 mg total) by mouth every 6 (six) hours as needed (Nausea or vomiting)., Disp: 30 tablet, Rfl: 0 .  losartan (COZAAR) 100 MG tablet, Take 100 mg by mouth daily., Disp: , Rfl:  .  Multiple Vitamin (MULTI-VITAMINS) TABS, Take 1 tablet by mouth daily., Disp: , Rfl:  .  ondansetron (ZOFRAN) 8 MG tablet, Take 1 tablet (8 mg total) by mouth 2 (two) times daily as needed (Nausea or vomiting)., Disp: 30 tablet, Rfl: 1 .  Pancrelipase, Lip-Prot-Amyl, 40000-126000 units CPEP, Take 1 Dose by mouth in the morning, at noon, in the evening, and at bedtime., Disp: , Rfl:  .  pantoprazole (PROTONIX) 40 MG tablet, Take 40 mg by mouth daily., Disp: , Rfl:  .  prochlorperazine (COMPAZINE) 10 MG tablet, Take 1 tablet (  10 mg total) by mouth every 6 (six) hours as needed (Nausea or vomiting)., Disp: 30 tablet, Rfl: 1 .  traZODone (DESYREL) 100 MG tablet, Take 100 mg by mouth at bedtime., Disp: , Rfl:  No current facility-administered medications for this visit.  Facility-Administered Medications Ordered in Other Visits:  .  0.9 %  sodium chloride infusion, , Intravenous, Once, Burns, Jennifer E, NP .  alteplase (CATHFLO ACTIVASE) injection 2 mg, 2 mg, Intracatheter, Once PRN, Faythe Casa E, NP .  heparin lock flush 100 unit/mL, 500 Units, Intracatheter, Once PRN, Sindy Guadeloupe, MD .  heparin lock flush 100 unit/mL, 250 Units, Intracatheter, Once PRN, Faythe Casa E, NP .  sodium chloride flush (NS) 0.9 % injection 10 mL, 10 mL, Intravenous, PRN, Sindy Guadeloupe, MD, 10 mL at 06/06/20 1039 .  sodium chloride flush (NS) 0.9 % injection 10 mL, 10 mL, Intracatheter, PRN, Sindy Guadeloupe, MD .  sodium chloride flush (NS) 0.9 %  injection 10 mL, 10 mL, Intracatheter, PRN, Faythe Casa E, NP, 10 mL at 06/20/20 1030 .  sodium chloride flush (NS) 0.9 % injection 3 mL, 3 mL, Intracatheter, PRN, Jacquelin Hawking, NP  Physical exam:  Vitals:   06/28/20 0959  BP: (!) 166/60  Pulse: 63  Resp: 18  Temp: (!) 97.5 F (36.4 C)  TempSrc: Tympanic  SpO2: 99%  Weight: 113 lb 12.8 oz (51.6 kg)   Physical Exam Cardiovascular:     Rate and Rhythm: Normal rate and regular rhythm.     Heart sounds: Normal heart sounds.  Pulmonary:     Effort: Pulmonary effort is normal.     Breath sounds: Normal breath sounds.  Skin:    General: Skin is warm and dry.  Neurological:     Mental Status: She is alert and oriented to person, place, and time.      CMP Latest Ref Rng & Units 06/20/2020  Glucose 70 - 99 mg/dL 149(H)  BUN 8 - 23 mg/dL 14  Creatinine 0.44 - 1.00 mg/dL 0.87  Sodium 135 - 145 mmol/L 137  Potassium 3.5 - 5.1 mmol/L 4.0  Chloride 98 - 111 mmol/L 101  CO2 22 - 32 mmol/L 25  Calcium 8.9 - 10.3 mg/dL 8.6(L)  Total Protein 6.5 - 8.1 g/dL 6.3(L)  Total Bilirubin 0.3 - 1.2 mg/dL 1.1  Alkaline Phos 38 - 126 U/L 68  AST 15 - 41 U/L 23  ALT 0 - 44 U/L 14   CBC Latest Ref Rng & Units 06/20/2020  WBC 4.0 - 10.5 K/uL 5.4  Hemoglobin 12.0 - 15.0 g/dL 9.8(L)  Hematocrit 36.0 - 46.0 % 29.0(L)  Platelets 150 - 400 K/uL 241      Assessment and plan- Patient is a 80 y.o. female withpancreatic adenocarcinoma stage IBT2 N0 M0 s/p Whipple surgery.   She is here for on treatment assessment prior to cycle 4-day 8 of adjuvant gemcitabine chemotherapy  Missed cycle 4-day 8 of chemotherapy.  She received cycle 5-day 1 on 06/20/2020.  She will therefore proceed with cycle 5-day 8 of gemcitabine chemotherapy today.  I will see her back in 2 weeks for cycle six 6-day 1 of gemcitabine chemotherapy.   Patient had CT chest abdomen and pelvis with contrast on 06/25/2020 at [facility] which did not reveal any evidence of disease  recurrence.  Postoperative changes of Whipple's procedure with migration of pancreatic duct stent to the common bile duct and mild persistent intra and extrahepatic biliary ductal dilatation.  Nausea:  Likely secondary to chemotherapy.  Continue as needed Zofran Compazine   Visit Diagnosis 1. Encounter for antineoplastic chemotherapy   2. Malignant neoplasm of head of pancreas [facility])      Dr. Randa Evens, MD, MPH [facility] at [facility] [phone] 06/28/2020 9:55 AM

## 2020-06-29 LAB — CANCER ANTIGEN 19-9: CA 19-9: 9 U/mL (ref 0–35)

## 2020-07-04 ENCOUNTER — Ambulatory Visit: Payer: Medicare Other

## 2020-07-04 ENCOUNTER — Other Ambulatory Visit: Payer: Medicare Other

## 2020-07-05 ENCOUNTER — Inpatient Hospital Stay: Payer: Medicare Other

## 2020-07-12 ENCOUNTER — Encounter: Payer: Self-pay | Admitting: Oncology

## 2020-07-12 ENCOUNTER — Inpatient Hospital Stay: Payer: Medicare Other

## 2020-07-12 ENCOUNTER — Inpatient Hospital Stay: Payer: Medicare Other | Admitting: Oncology

## 2020-07-12 ENCOUNTER — Other Ambulatory Visit: Payer: Self-pay

## 2020-07-12 VITALS — BP 132/69 | HR 60 | Resp 16

## 2020-07-12 VITALS — BP 144/48 | HR 60 | Temp 96.5°F | Resp 20 | Wt 113.1 lb

## 2020-07-12 DIAGNOSIS — C250 Malignant neoplasm of head of pancreas: Secondary | ICD-10-CM

## 2020-07-12 DIAGNOSIS — Z5111 Encounter for antineoplastic chemotherapy: Secondary | ICD-10-CM | POA: Diagnosis not present

## 2020-07-12 LAB — CBC WITH DIFFERENTIAL/PLATELET
Abs Immature Granulocytes: 0.01 10*3/uL (ref 0.00–0.07)
Basophils Absolute: 0 10*3/uL (ref 0.0–0.1)
Basophils Relative: 1 %
Eosinophils Absolute: 0.3 10*3/uL (ref 0.0–0.5)
Eosinophils Relative: 6 %
HCT: 29.1 % — ABNORMAL LOW (ref 36.0–46.0)
Hemoglobin: 9.9 g/dL — ABNORMAL LOW (ref 12.0–15.0)
Immature Granulocytes: 0 %
Lymphocytes Relative: 15 %
Lymphs Abs: 0.7 10*3/uL (ref 0.7–4.0)
MCH: 33.1 pg (ref 26.0–34.0)
MCHC: 34 g/dL (ref 30.0–36.0)
MCV: 97.3 fL (ref 80.0–100.0)
Monocytes Absolute: 0.4 10*3/uL (ref 0.1–1.0)
Monocytes Relative: 8 %
Neutro Abs: 3.4 10*3/uL (ref 1.7–7.7)
Neutrophils Relative %: 70 %
Platelets: 320 10*3/uL (ref 150–400)
RBC: 2.99 MIL/uL — ABNORMAL LOW (ref 3.87–5.11)
RDW: 17.8 % — ABNORMAL HIGH (ref 11.5–15.5)
WBC: 4.8 10*3/uL (ref 4.0–10.5)
nRBC: 0 % (ref 0.0–0.2)

## 2020-07-12 LAB — COMPREHENSIVE METABOLIC PANEL
ALT: 13 U/L (ref 0–44)
AST: 22 U/L (ref 15–41)
Albumin: 3.8 g/dL (ref 3.5–5.0)
Alkaline Phosphatase: 59 U/L (ref 38–126)
Anion gap: 8 (ref 5–15)
BUN: 16 mg/dL (ref 8–23)
CO2: 26 mmol/L (ref 22–32)
Calcium: 8.8 mg/dL — ABNORMAL LOW (ref 8.9–10.3)
Chloride: 101 mmol/L (ref 98–111)
Creatinine, Ser: 1.02 mg/dL — ABNORMAL HIGH (ref 0.44–1.00)
GFR, Estimated: 56 mL/min — ABNORMAL LOW (ref >60)
Glucose, Bld: 127 mg/dL — ABNORMAL HIGH (ref 70–99)
Potassium: 4.5 mmol/L (ref 3.5–5.1)
Sodium: 135 mmol/L (ref 135–145)
Total Bilirubin: 0.9 mg/dL (ref 0.3–1.2)
Total Protein: 6.2 g/dL — ABNORMAL LOW (ref 6.5–8.1)

## 2020-07-12 MED ORDER — PROCHLORPERAZINE MALEATE 10 MG PO TABS
10.0000 mg | ORAL_TABLET | Freq: Once | ORAL | Status: AC
  Administered 2020-07-12: 10 mg via ORAL
  Filled 2020-07-12: qty 1

## 2020-07-12 MED ORDER — SODIUM CHLORIDE 0.9 % IV SOLN
800.0000 mg/m2 | Freq: Once | INTRAVENOUS | Status: AC
  Administered 2020-07-12: 1254 mg via INTRAVENOUS
  Filled 2020-07-12: qty 10.52

## 2020-07-12 MED ORDER — HEPARIN SOD (PORK) LOCK FLUSH 100 UNIT/ML IV SOLN
500.0000 [IU] | Freq: Once | INTRAVENOUS | Status: AC | PRN
  Administered 2020-07-12: 500 [IU]
  Filled 2020-07-12: qty 5

## 2020-07-12 MED ORDER — SODIUM CHLORIDE 0.9 % IV SOLN
Freq: Once | INTRAVENOUS | Status: AC
  Filled 2020-07-12: qty 250

## 2020-07-12 MED ORDER — HEPARIN SOD (PORK) LOCK FLUSH 100 UNIT/ML IV SOLN
500.0000 [IU] | Freq: Once | INTRAVENOUS | Status: AC
Start: 2020-07-12 — End: ?
  Filled 2020-07-12: qty 5

## 2020-07-12 MED ORDER — HEPARIN SOD (PORK) LOCK FLUSH 100 UNIT/ML IV SOLN
INTRAVENOUS | Status: AC
  Filled 2020-07-12: qty 5

## 2020-07-14 NOTE — Progress Notes (Signed)
Hematology/Oncology Consult note [facility]  Telephone:([phone] Fax:(336) [phone]  Patient Care Team: Baxter Hire, MD as PCP - General (Internal Medicine)   Name of the patient: Emily Harding  [id number]  04-Aug-1940   Date of visit: 07/14/20  Diagnosis- pancreatic adenocarcinoma stage Ib T2 N0 M0 s/p Whipple surgery  Chief complaint/ Reason for visit-on treatment assessment prior to cycle 6-day 1 of adjuvant gemcitabine chemotherapy  Heme/Onc history: patient is a 80 year old female with an oncology history as follows: 1.On 11/22/2019 she presented with left lower quadrant abdominal pain and persistent nausea. CT abdomen and pelvis showed a 3.1 x 2.3 cm mass in the head of the pancreas with diffuse pancreatic ductal dilatation.CA 19-9 elevated at 166 2.EUS showed 2.0 x 1.9 cm mass in the head of pancreas with no vessel involvement. UT2N0. Pathology showed adenocarcinoma 3.CT chest showed 2 to 3 mm lung nodules with no evidence of distant metastatic disease. In October 2021 patient underwent Whipple resection with pathology demonstrating 3.1 cm adenocarcinoma with mucinous features. Uncinate margin positive for tumor. Tumor approaches less than 1 mm within cautery. Positive for PNI. 0 out of 18 lymph nodes positive for malignancy. 4.Patient evaluated by medical oncology at [facility] and was recommended adjuvant chemotherapy followed by chemoradiation  Multiple adjuvant regimens including gem Abraxane, gemcitabine and Xeloda and gemcitabine single agent discussed with patient and patient decided to proceed with single agent gemcitabine starting 04/11/2020  Interval history-patient reports ongoing fatigue and mild nausea.  Denies other complaints  ECOG PS- 1 Pain scale- 0 Opioid associated constipation- no  Review of systems- Review of Systems  Constitutional: Positive for malaise/fatigue. Negative for chills, fever and weight loss.  HENT:  Negative for congestion, ear discharge and nosebleeds.   Eyes: Negative for blurred vision.  Respiratory: Negative for cough, hemoptysis, sputum production, shortness of breath and wheezing.   Cardiovascular: Negative for chest pain, palpitations, orthopnea and claudication.  Gastrointestinal: Positive for nausea. Negative for abdominal pain, blood in stool, constipation, diarrhea, heartburn, melena and vomiting.  Genitourinary: Negative for dysuria, flank pain, frequency, hematuria and urgency.  Musculoskeletal: Negative for back pain, joint pain and myalgias.  Skin: Negative for rash.  Neurological: Negative for dizziness, tingling, focal weakness, seizures, weakness and headaches.  Endo/Heme/Allergies: Does not bruise/bleed easily.  Psychiatric/Behavioral: Negative for depression and suicidal ideas. The patient does not have insomnia.       Allergies  Allergen Reactions  . Hydromorphone Hcl Itching  . Azithromycin   . Codeine Sulfate Other (See Comments)  . Doxycycline Calcium Other (See Comments)  . Duloxetine Nausea Only  . Erythromycin Ethylsuccinate Other (See Comments)  . Macrolides And Ketolides Other (See Comments)  . Nitrofurantoin     Other reaction(s): Unknown Other reaction(s): Abdominal Pain Other reaction(s): Unknown  . Sulfa Antibiotics      Past Medical History:  Diagnosis Date  . Anxiety   . Arthritis   . Breast cancer ([place]) 1993   RT MASTECTOMY  . Heartburn   . HTN (hypertension)   . Pancreatic cancer ([place])   . Personal history of chemotherapy   . S/P chemotherapy, time since greater than 12 weeks 1993   BREAST CA     Past Surgical History:  Procedure Laterality Date  . ABDOMINAL HYSTERECTOMY    . colon polyp removal    . GALLBLADDER SURGERY    . LAPAROSCOPIC OOPHERECTOMY  1978  . MASTECTOMY Right 1993   BREAST CA  . PANCREAS SURGERY N/A 01/04/2020  Per patient removed head of pancreas due to cancer  . PORTA CATH INSERTION N/A 04/04/2020    Procedure: PORTA CATH INSERTION;  Surgeon: Algernon Huxley, MD;  Location: [place] [facility];  Service: Cardiovascular;  Laterality: N/A;  . removal of fallopian tubes  1989    Social History   Socioeconomic History  . Marital status: Widowed    Spouse name: Not on file  . Number of children: Not on file  . Years of education: Not on file  . Highest education level: Not on file  Occupational History  . Not on file  Tobacco Use  . Smoking status: Never Smoker  . Smokeless tobacco: Never Used  Vaping Use  . Vaping Use: Never used  Substance and Sexual Activity  . Alcohol use: No  . Drug use: No  . Sexual activity: Not Currently  Other Topics Concern  . Not on file  Social History Narrative  . Not on file   Social Determinants of Health   Financial Resource Strain: Not on file  Food Insecurity: Not on file  Transportation Needs: Not on file  Physical Activity: Not on file  Stress: Not on file  Social Connections: Not on file  Intimate Partner Violence: Not on file    Family History  Problem Relation Age of Onset  . Breast cancer Neg Hx   . Kidney cancer Neg Hx   . Bladder Cancer Neg Hx   . Prostate cancer Neg Hx      Current Outpatient Medications:  .  aspirin EC 81 MG tablet, Take 81 mg by mouth daily., Disp: , Rfl:  .  atenolol (TENORMIN) 50 MG tablet, Take 50 mg by mouth 2 (two) times daily., Disp: , Rfl:  .  celecoxib (CELEBREX) 200 MG capsule, Take 200 mg by mouth daily., Disp: , Rfl:  .  clorazepate (TRANXENE) 3.75 MG tablet, Take 3.75 mg by mouth daily as needed., Disp: , Rfl:  .  gabapentin (NEURONTIN) 400 MG capsule, Take 400 mg by mouth 3 (three) times daily., Disp: , Rfl:  .  LORazepam (ATIVAN) 0.5 MG tablet, Take 1 tablet (0.5 mg total) by mouth every 6 (six) hours as needed (Nausea or vomiting)., Disp: 30 tablet, Rfl: 0 .  losartan (COZAAR) 100 MG tablet, Take 100 mg by mouth daily., Disp: , Rfl:  .  Multiple Vitamin (MULTI-VITAMINS) TABS, Take 1  tablet by mouth daily., Disp: , Rfl:  .  ondansetron (ZOFRAN) 8 MG tablet, Take 1 tablet (8 mg total) by mouth 2 (two) times daily as needed (Nausea or vomiting)., Disp: 30 tablet, Rfl: 1 .  Pancrelipase, Lip-Prot-Amyl, 40000-126000 units CPEP, Take 1 Dose by mouth in the morning, at noon, in the evening, and at bedtime., Disp: , Rfl:  .  pantoprazole (PROTONIX) 40 MG tablet, Take 40 mg by mouth daily., Disp: , Rfl:  .  prochlorperazine (COMPAZINE) 10 MG tablet, Take 1 tablet (10 mg total) by mouth every 6 (six) hours as needed (Nausea or vomiting)., Disp: 30 tablet, Rfl: 1 .  traZODone (DESYREL) 100 MG tablet, Take 100 mg by mouth at bedtime., Disp: , Rfl:  .  amLODipine (NORVASC) 2.5 MG tablet, Take 2.5 mg by mouth daily. (Patient not taking: No sig reported), Disp: , Rfl:  .  cyclobenzaprine (FLEXERIL) 10 MG tablet, TAKE 1 TABLET (10 MG TOTAL) BY MOUTH 3 (THREE) TIMES DAILY AS NEEDED FOR MUSCLE SPASMS. (Patient not taking: No sig reported), Disp: , Rfl:  .  lidocaine-prilocaine (EMLA)  cream, Apply small amount of cream over port site 1 1/2 hours before each treatment, place saran wrap over cream to protect clothin (Patient not taking: No sig reported), Disp: 30 g, Rfl: 3 No current facility-administered medications for this visit.  Facility-Administered Medications Ordered in Other Visits:  .  0.9 %  sodium chloride infusion, , Intravenous, Once, Burns, Jennifer E, NP .  alteplase (CATHFLO ACTIVASE) injection 2 mg, 2 mg, Intracatheter, Once PRN, Faythe Casa E, NP .  heparin lock flush 100 unit/mL, 500 Units, Intracatheter, Once PRN, Sindy Guadeloupe, MD .  heparin lock flush 100 unit/mL, 250 Units, Intracatheter, Once PRN, Faythe Casa E, NP .  heparin lock flush 100 unit/mL, 500 Units, Intravenous, Once, Sindy Guadeloupe, MD .  sodium chloride flush (NS) 0.9 % injection 10 mL, 10 mL, Intravenous, PRN, Sindy Guadeloupe, MD, 10 mL at 06/06/20 1039 .  sodium chloride flush (NS) 0.9 % injection 10  mL, 10 mL, Intracatheter, PRN, Sindy Guadeloupe, MD .  sodium chloride flush (NS) 0.9 % injection 10 mL, 10 mL, Intracatheter, PRN, Faythe Casa E, NP, 10 mL at 06/20/20 1030 .  sodium chloride flush (NS) 0.9 % injection 3 mL, 3 mL, Intracatheter, PRN, Jacquelin Hawking, NP  Physical exam:  Vitals:   07/12/20 1327  BP: (!) 144/48  Pulse: 60  Resp: 20  Temp: (!) 96.5 F (35.8 C)  TempSrc: Tympanic  SpO2: 99%  Weight: 113 lb 1.6 oz (51.3 kg)   Physical Exam Cardiovascular:     Rate and Rhythm: Normal rate and regular rhythm.     Heart sounds: Normal heart sounds.  Pulmonary:     Effort: Pulmonary effort is normal.     Breath sounds: Normal breath sounds.  Abdominal:     General: Bowel sounds are normal.     Palpations: Abdomen is soft.  Skin:    General: Skin is warm and dry.  Neurological:     Mental Status: She is alert and oriented to person, place, and time.      CMP Latest Ref Rng & Units 07/12/2020  Glucose 70 - 99 mg/dL 127(H)  BUN 8 - 23 mg/dL 16  Creatinine 0.44 - 1.00 mg/dL 1.02(H)  Sodium 135 - 145 mmol/L 135  Potassium 3.5 - 5.1 mmol/L 4.5  Chloride 98 - 111 mmol/L 101  CO2 22 - 32 mmol/L 26  Calcium 8.9 - 10.3 mg/dL 8.8(L)  Total Protein 6.5 - 8.1 g/dL 6.2(L)  Total Bilirubin 0.3 - 1.2 mg/dL 0.9  Alkaline Phos 38 - 126 U/L 59  AST 15 - 41 U/L 22  ALT 0 - 44 U/L 13   CBC Latest Ref Rng & Units 07/12/2020  WBC 4.0 - 10.5 K/uL 4.8  Hemoglobin 12.0 - 15.0 g/dL 9.9(L)  Hematocrit 36.0 - 46.0 % 29.1(L)  Platelets 150 - 400 K/uL 320     Assessment and plan- Patient is a 80 y.o. female withpancreatic adenocarcinoma stage IBT2 N0 M0 s/p Whipple surgery.She is here for on treatment assessment prior to cycle 5-day 1 of gemcitabine chemotherapy  She will directly proceed for cycle 5-day 8 of gemcitabine chemotherapy in 1 week and I will see her back in 3 weeks for cycle 6-day 1.Patient also was seen by [facility] and underwent a CT chest  abdomen and pelvis with contrast on 06/25/2020.  She was not found to have any evidence of recurrent or metastatic disease.  Postoperative changes post Whipple.  Mild bilateral urothelial thickening and  enhancement without any evidence of obstruction.  Normocytic anemia possibly secondary to chemotherapy.  Prior to starting chemotherapy her hemoglobin was close to 12.  Presently it is drifted down to 9.9.  We will check ferritin and iron studies B12 and folate at next set of labs.   Visit Diagnosis 1. Malignant neoplasm of head of pancreas ([place])   2. Encounter for antineoplastic chemotherapy      Dr. Randa Evens, MD, MPH [facility] [id number] 07/14/2020 7:11 PM

## 2020-07-19 ENCOUNTER — Inpatient Hospital Stay: Payer: Medicare Other

## 2020-07-19 ENCOUNTER — Other Ambulatory Visit: Payer: Self-pay

## 2020-07-19 ENCOUNTER — Other Ambulatory Visit: Payer: Self-pay | Admitting: Oncology

## 2020-07-19 VITALS — BP 165/57 | HR 57 | Temp 96.0°F | Wt 111.1 lb

## 2020-07-19 DIAGNOSIS — Z5111 Encounter for antineoplastic chemotherapy: Secondary | ICD-10-CM

## 2020-07-19 DIAGNOSIS — C250 Malignant neoplasm of head of pancreas: Secondary | ICD-10-CM | POA: Diagnosis not present

## 2020-07-19 LAB — CBC WITH DIFFERENTIAL/PLATELET
Abs Immature Granulocytes: 0 10*3/uL (ref 0.00–0.07)
Basophils Absolute: 0 10*3/uL (ref 0.0–0.1)
Basophils Relative: 1 %
Eosinophils Absolute: 0.1 10*3/uL (ref 0.0–0.5)
Eosinophils Relative: 3 %
HCT: 30.8 % — ABNORMAL LOW (ref 36.0–46.0)
Hemoglobin: 10.5 g/dL — ABNORMAL LOW (ref 12.0–15.0)
Immature Granulocytes: 0 %
Lymphocytes Relative: 21 %
Lymphs Abs: 0.7 10*3/uL (ref 0.7–4.0)
MCH: 32.7 pg (ref 26.0–34.0)
MCHC: 34.1 g/dL (ref 30.0–36.0)
MCV: 96 fL (ref 80.0–100.0)
Monocytes Absolute: 0.2 10*3/uL (ref 0.1–1.0)
Monocytes Relative: 7 %
Neutro Abs: 2.2 10*3/uL (ref 1.7–7.7)
Neutrophils Relative %: 68 %
Platelets: 473 10*3/uL — ABNORMAL HIGH (ref 150–400)
RBC: 3.21 MIL/uL — ABNORMAL LOW (ref 3.87–5.11)
RDW: 16.1 % — ABNORMAL HIGH (ref 11.5–15.5)
WBC: 3.2 10*3/uL — ABNORMAL LOW (ref 4.0–10.5)
nRBC: 0 % (ref 0.0–0.2)

## 2020-07-19 LAB — COMPREHENSIVE METABOLIC PANEL
ALT: 15 U/L (ref 0–44)
AST: 28 U/L (ref 15–41)
Albumin: 3.8 g/dL (ref 3.5–5.0)
Alkaline Phosphatase: 61 U/L (ref 38–126)
Anion gap: 8 (ref 5–15)
BUN: 14 mg/dL (ref 8–23)
CO2: 28 mmol/L (ref 22–32)
Calcium: 9.2 mg/dL (ref 8.9–10.3)
Chloride: 100 mmol/L (ref 98–111)
Creatinine, Ser: 0.99 mg/dL (ref 0.44–1.00)
GFR, Estimated: 58 mL/min — ABNORMAL LOW (ref >60)
Glucose, Bld: 120 mg/dL — ABNORMAL HIGH (ref 70–99)
Potassium: 4.2 mmol/L (ref 3.5–5.1)
Sodium: 136 mmol/L (ref 135–145)
Total Bilirubin: 0.7 mg/dL (ref 0.3–1.2)
Total Protein: 6.6 g/dL (ref 6.5–8.1)

## 2020-07-19 LAB — IRON AND TIBC
Iron: 34 ug/dL (ref 28–170)
Saturation Ratios: 10 % — ABNORMAL LOW (ref 10.4–31.8)
TIBC: 347 ug/dL (ref 250–450)
UIBC: 313 ug/dL

## 2020-07-19 LAB — FERRITIN: Ferritin: 47 ng/mL (ref 11–307)

## 2020-07-19 LAB — FOLATE: Folate: 66 ng/mL (ref >5.9)

## 2020-07-19 LAB — VITAMIN B12: Vitamin B-12: 632 pg/mL (ref 180–914)

## 2020-07-19 MED ORDER — HEPARIN SOD (PORK) LOCK FLUSH 100 UNIT/ML IV SOLN
INTRAVENOUS | Status: AC
  Filled 2020-07-19: qty 5

## 2020-07-19 MED ORDER — HEPARIN SOD (PORK) LOCK FLUSH 100 UNIT/ML IV SOLN
500.0000 [IU] | Freq: Once | INTRAVENOUS | Status: AC | PRN
  Administered 2020-07-19: 500 [IU]
  Filled 2020-07-19: qty 5

## 2020-07-19 MED ORDER — SODIUM CHLORIDE 0.9 % IV SOLN
800.0000 mg/m2 | Freq: Once | INTRAVENOUS | Status: AC
  Administered 2020-07-19: 1254 mg via INTRAVENOUS
  Filled 2020-07-19: qty 26.3

## 2020-07-19 MED ORDER — PROCHLORPERAZINE MALEATE 10 MG PO TABS
10.0000 mg | ORAL_TABLET | Freq: Once | ORAL | Status: AC
  Administered 2020-07-19: 10 mg via ORAL
  Filled 2020-07-19: qty 1

## 2020-07-19 MED ORDER — SODIUM CHLORIDE 0.9 % IV SOLN
Freq: Once | INTRAVENOUS | Status: AC
  Filled 2020-07-19: qty 250

## 2020-07-19 NOTE — Patient Instructions (Signed)
CANCER CENTER [place] [facility]  Discharge Instructions: Thank you for choosing [facility] to provide your oncology and hematology care.  If you have a lab appointment with the Cancer Center, please go directly to the Cancer Center and check in at the registration area.  Wear comfortable clothing and clothing appropriate for easy access to any Portacath or PICC line.   We strive to give you quality time with your provider. You may need to reschedule your appointment if you arrive late (15 or more minutes).  Arriving late affects you and other patients whose appointments are after yours.  Also, if you miss three or more appointments without notifying the office, you may be dismissed from the clinic at the provider's discretion.      For prescription refill requests, have your pharmacy contact our office and allow 72 hours for refills to be completed.    Today you received the following chemotherapy and/or immunotherapy agents Gemzar   To help prevent nausea and vomiting after your treatment, we encourage you to take your nausea medication as directed.  BELOW ARE SYMPTOMS THAT SHOULD BE REPORTED IMMEDIATELY: *FEVER GREATER THAN 100.4 F (38 C) OR HIGHER *CHILLS OR SWEATING *NAUSEA AND VOMITING THAT IS NOT CONTROLLED WITH YOUR NAUSEA MEDICATION *UNUSUAL SHORTNESS OF BREATH *UNUSUAL BRUISING OR BLEEDING *URINARY PROBLEMS (pain or burning when urinating, or frequent urination) *BOWEL PROBLEMS (unusual diarrhea, constipation, pain near the anus) TENDERNESS IN MOUTH AND THROAT WITH OR WITHOUT PRESENCE OF ULCERS (sore throat, sores in mouth, or a toothache) UNUSUAL RASH, SWELLING OR PAIN  UNUSUAL VAGINAL DISCHARGE OR ITCHING   Items with * indicate a potential emergency and should be followed up as soon as possible or go to the Emergency Department if any problems should occur.  Please show the CHEMOTHERAPY ALERT CARD or IMMUNOTHERAPY ALERT CARD at check-in to the  Emergency Department and triage nurse.  Should you have questions after your visit or need to cancel or reschedule your appointment, please contact CANCER CENTER [place] [facility]  [phone] and follow the prompts.  Office hours are 8:00 a.m. to 4:30 p.m. Monday - Friday. Please note that voicemails left after 4:00 p.m. may not be returned until the following business day.  We are closed weekends and major holidays. You have access to a nurse at all times for urgent questions. Please call the main number to the clinic [phone] and follow the prompts.  For any non-urgent questions, you may also contact your provider using MyChart. We now offer e-Visits for anyone 18 and older to request care online for non-urgent symptoms. For details visit [email or web address].   Also download the MyChart app! Go to the app store, search "MyChart", open the app, select [place], and log in with your MyChart username and password.  Due to Covid, a mask is required upon entering the hospital/clinic. If you do not have a mask, one will be given to you upon arrival. For doctor visits, patients may have 1 support person aged 18 or older with them. For treatment visits, patients cannot have anyone with them due to current Covid guidelines and our immunocompromised population.  

## 2020-07-20 LAB — CANCER ANTIGEN 19-9: CA 19-9: 7 U/mL (ref 0–35)

## 2020-08-02 ENCOUNTER — Other Ambulatory Visit: Payer: Self-pay

## 2020-08-02 ENCOUNTER — Inpatient Hospital Stay: Payer: Medicare Other | Attending: Oncology

## 2020-08-02 ENCOUNTER — Inpatient Hospital Stay: Payer: Medicare Other

## 2020-08-02 ENCOUNTER — Inpatient Hospital Stay (HOSPITAL_BASED_OUTPATIENT_CLINIC_OR_DEPARTMENT_OTHER): Payer: Medicare Other | Admitting: Oncology

## 2020-08-02 VITALS — BP 154/56 | HR 59 | Temp 97.7°F | Resp 16 | Wt 113.0 lb

## 2020-08-02 DIAGNOSIS — C250 Malignant neoplasm of head of pancreas: Secondary | ICD-10-CM

## 2020-08-02 DIAGNOSIS — Z90710 Acquired absence of both cervix and uterus: Secondary | ICD-10-CM | POA: Diagnosis not present

## 2020-08-02 DIAGNOSIS — Z853 Personal history of malignant neoplasm of breast: Secondary | ICD-10-CM | POA: Diagnosis not present

## 2020-08-02 DIAGNOSIS — E871 Hypo-osmolality and hyponatremia: Secondary | ICD-10-CM | POA: Insufficient documentation

## 2020-08-02 DIAGNOSIS — Z79899 Other long term (current) drug therapy: Secondary | ICD-10-CM | POA: Insufficient documentation

## 2020-08-02 DIAGNOSIS — D6481 Anemia due to antineoplastic chemotherapy: Secondary | ICD-10-CM | POA: Diagnosis not present

## 2020-08-02 DIAGNOSIS — Z9011 Acquired absence of right breast and nipple: Secondary | ICD-10-CM | POA: Insufficient documentation

## 2020-08-02 DIAGNOSIS — I10 Essential (primary) hypertension: Secondary | ICD-10-CM | POA: Insufficient documentation

## 2020-08-02 DIAGNOSIS — Z5111 Encounter for antineoplastic chemotherapy: Secondary | ICD-10-CM | POA: Insufficient documentation

## 2020-08-02 DIAGNOSIS — Z7982 Long term (current) use of aspirin: Secondary | ICD-10-CM | POA: Diagnosis not present

## 2020-08-02 DIAGNOSIS — F419 Anxiety disorder, unspecified: Secondary | ICD-10-CM | POA: Diagnosis not present

## 2020-08-02 DIAGNOSIS — T451X5A Adverse effect of antineoplastic and immunosuppressive drugs, initial encounter: Secondary | ICD-10-CM

## 2020-08-02 DIAGNOSIS — R110 Nausea: Secondary | ICD-10-CM | POA: Insufficient documentation

## 2020-08-02 LAB — COMPREHENSIVE METABOLIC PANEL
ALT: 13 U/L (ref 0–44)
AST: 26 U/L (ref 15–41)
Albumin: 3.7 g/dL (ref 3.5–5.0)
Alkaline Phosphatase: 62 U/L (ref 38–126)
Anion gap: 10 (ref 5–15)
BUN: 13 mg/dL (ref 8–23)
CO2: 25 mmol/L (ref 22–32)
Calcium: 8.9 mg/dL (ref 8.9–10.3)
Chloride: 101 mmol/L (ref 98–111)
Creatinine, Ser: 0.97 mg/dL (ref 0.44–1.00)
GFR, Estimated: 60 mL/min — ABNORMAL LOW (ref >60)
Glucose, Bld: 132 mg/dL — ABNORMAL HIGH (ref 70–99)
Potassium: 4.1 mmol/L (ref 3.5–5.1)
Sodium: 136 mmol/L (ref 135–145)
Total Bilirubin: 0.6 mg/dL (ref 0.3–1.2)
Total Protein: 6.5 g/dL (ref 6.5–8.1)

## 2020-08-02 LAB — CBC WITH DIFFERENTIAL/PLATELET
Abs Immature Granulocytes: 0.01 10*3/uL (ref 0.00–0.07)
Basophils Absolute: 0 10*3/uL (ref 0.0–0.1)
Basophils Relative: 1 %
Eosinophils Absolute: 0.3 10*3/uL (ref 0.0–0.5)
Eosinophils Relative: 5 %
HCT: 31 % — ABNORMAL LOW (ref 36.0–46.0)
Hemoglobin: 10.3 g/dL — ABNORMAL LOW (ref 12.0–15.0)
Immature Granulocytes: 0 %
Lymphocytes Relative: 11 %
Lymphs Abs: 0.6 10*3/uL — ABNORMAL LOW (ref 0.7–4.0)
MCH: 33 pg (ref 26.0–34.0)
MCHC: 33.2 g/dL (ref 30.0–36.0)
MCV: 99.4 fL (ref 80.0–100.0)
Monocytes Absolute: 0.6 10*3/uL (ref 0.1–1.0)
Monocytes Relative: 13 %
Neutro Abs: 3.4 10*3/uL (ref 1.7–7.7)
Neutrophils Relative %: 70 %
Platelets: 329 10*3/uL (ref 150–400)
RBC: 3.12 MIL/uL — ABNORMAL LOW (ref 3.87–5.11)
RDW: 16.4 % — ABNORMAL HIGH (ref 11.5–15.5)
WBC: 4.9 10*3/uL (ref 4.0–10.5)
nRBC: 0 % (ref 0.0–0.2)

## 2020-08-02 MED ORDER — LORAZEPAM 0.5 MG PO TABS: 0.5000 mg | ORAL_TABLET | Freq: Four times a day (QID) | ORAL | 0 refills | Status: DC | PRN

## 2020-08-02 MED ORDER — SODIUM CHLORIDE 0.9 % IV SOLN
800.0000 mg/m2 | Freq: Once | INTRAVENOUS | Status: AC
  Administered 2020-08-02: 1254 mg via INTRAVENOUS
  Filled 2020-08-02: qty 26.3

## 2020-08-02 MED ORDER — ONDANSETRON HCL 8 MG PO TABS: 8.0000 mg | ORAL_TABLET | Freq: Two times a day (BID) | ORAL | 1 refills | Status: DC | PRN

## 2020-08-02 MED ORDER — SODIUM CHLORIDE 0.9% FLUSH
10.0000 mL | Freq: Once | INTRAVENOUS | Status: AC
Start: 2020-08-02 — End: 2020-08-02
  Administered 2020-08-02: 10 mL via INTRAVENOUS
  Filled 2020-08-02: qty 10

## 2020-08-02 MED ORDER — HEPARIN SOD (PORK) LOCK FLUSH 100 UNIT/ML IV SOLN
500.0000 [IU] | Freq: Once | INTRAVENOUS | Status: AC | PRN
  Administered 2020-08-02: 500 [IU]
  Filled 2020-08-02: qty 5

## 2020-08-02 MED ORDER — PROCHLORPERAZINE MALEATE 10 MG PO TABS
10.0000 mg | ORAL_TABLET | Freq: Once | ORAL | Status: AC
  Administered 2020-08-02: 10 mg via ORAL
  Filled 2020-08-02: qty 1

## 2020-08-02 MED ORDER — HEPARIN SOD (PORK) LOCK FLUSH 100 UNIT/ML IV SOLN
INTRAVENOUS | Status: AC
  Filled 2020-08-02: qty 5

## 2020-08-02 MED ORDER — SODIUM CHLORIDE 0.9 % IV SOLN
Freq: Once | INTRAVENOUS | Status: AC
  Filled 2020-08-02: qty 250

## 2020-08-02 NOTE — Progress Notes (Signed)
Hematology/Oncology Consult note [facility]  [phone] [phone]  Patient Care Team: Baxter Hire, MD as PCP - General (Internal Medicine)   Name of the patient: Emily Harding  [id number]  Dec 30, 1940   Date of visit: 08/02/20  Diagnosis- pancreatic adenocarcinoma stage Ib T2 N0 M0 s/p Whipple surgery  Chief complaint/ Reason for visit-on treatment assessment prior to cycle 7-day 1 of adjuvant gemcitabine chemotherapy  Heme/Onc history: patient is a 80 year old female with an oncology history as follows: 1.On 11/22/2019 she presented with left lower quadrant abdominal pain and persistent nausea. CT abdomen and pelvis showed a 3.1 x 2.3 cm mass in the head of the pancreas with diffuse pancreatic ductal dilatation.CA 19-9 elevated at 166 2.EUS showed 2.0 x 1.9 cm mass in the head of pancreas with no vessel involvement. UT2N0. Pathology showed adenocarcinoma 3.CT chest showed 2 to 3 mm lung nodules with no evidence of distant metastatic disease. In October 2021 patient underwent Whipple resection with pathology demonstrating 3.1 cm adenocarcinoma with mucinous features. Uncinate margin positive for tumor. Tumor approaches less than 1 mm within cautery. Positive for PNI. 0 out of 18 lymph nodes positive for malignancy. 4.Patient evaluated by medical oncology at [facility] and was recommended adjuvant chemotherapy followed by chemoradiation  Multiple adjuvant regimens including gem Abraxane, gemcitabine and Xeloda and gemcitabine single agent discussed with patient and patient decided to proceed with single agent gemcitabine starting 04/11/2020   Interval history-patient reports ongoing fatigue and occasional on and off nausea.  She has some baseline anxiety for which she takes lorazepam.  ECOG PS- 1 Pain scale- 0 Opioid associated constipation- no  Review of systems- Review of Systems  Constitutional: Positive for  malaise/fatigue. Negative for chills, fever and weight loss.  HENT: Negative for congestion, ear discharge and nosebleeds.   Eyes: Negative for blurred vision.  Respiratory: Negative for cough, hemoptysis, sputum production, shortness of breath and wheezing.   Cardiovascular: Negative for chest pain, palpitations, orthopnea and claudication.  Gastrointestinal: Positive for nausea. Negative for abdominal pain, blood in stool, constipation, diarrhea, heartburn, melena and vomiting.  Genitourinary: Negative for dysuria, flank pain, frequency, hematuria and urgency.  Musculoskeletal: Negative for back pain, joint pain and myalgias.  Skin: Negative for rash.  Neurological: Negative for dizziness, tingling, focal weakness, seizures, weakness and headaches.  Endo/Heme/Allergies: Does not bruise/bleed easily.  Psychiatric/Behavioral: Negative for depression and suicidal ideas. The patient does not have insomnia.        Allergies  Allergen Reactions  . Hydromorphone Hcl Itching  . Azithromycin   . Codeine Sulfate Other (See Comments)  . Doxycycline Calcium Other (See Comments)  . Duloxetine Nausea Only  . Erythromycin Ethylsuccinate Other (See Comments)  . Macrolides And Ketolides Other (See Comments)  . Nitrofurantoin     Other reaction(s): Unknown Other reaction(s): Abdominal Pain Other reaction(s): Unknown  . Sulfa Antibiotics      Past Medical History:  Diagnosis Date  . Anxiety   . Arthritis   . Breast cancer ([place]) 1993   RT MASTECTOMY  . Heartburn   . HTN (hypertension)   . Pancreatic cancer ([place])   . Personal history of chemotherapy   . S/P chemotherapy, time since greater than 12 weeks 1993   BREAST CA     Past Surgical History:  Procedure Laterality Date  . ABDOMINAL HYSTERECTOMY    . colon polyp removal    . GALLBLADDER SURGERY    . LAPAROSCOPIC OOPHERECTOMY  1978  . MASTECTOMY  Right 1993   BREAST CA  . PANCREAS SURGERY N/A 01/04/2020   Per patient removed  head of pancreas due to cancer  . PORTA CATH INSERTION N/A 04/04/2020   Procedure: PORTA CATH INSERTION;  Surgeon: Algernon Huxley, MD;  Location: [facility];  Service: Cardiovascular;  Laterality: N/A;  . removal of fallopian tubes  1989    Social History   Socioeconomic History  . Marital status: Widowed    Spouse name: Not on file  . Number of children: Not on file  . Years of education: Not on file  . Highest education level: Not on file  Occupational History  . Not on file  Tobacco Use  . Smoking status: Never Smoker  . Smokeless tobacco: Never Used  Vaping Use  . Vaping Use: Never used  Substance and Sexual Activity  . Alcohol use: No  . Drug use: No  . Sexual activity: Not Currently  Other Topics Concern  . Not on file  Social History Narrative  . Not on file   Social Determinants of Health   Financial Resource Strain: Not on file  Food Insecurity: Not on file  Transportation Needs: Not on file  Physical Activity: Not on file  Stress: Not on file  Social Connections: Not on file  Intimate Partner Violence: Not on file    Family History  Problem Relation Age of Onset  . Breast cancer Neg Hx   . Kidney cancer Neg Hx   . Bladder Cancer Neg Hx   . Prostate cancer Neg Hx      Current Outpatient Medications:  .  aspirin EC 81 MG tablet, Take 81 mg by mouth daily., Disp: , Rfl:  .  atenolol (TENORMIN) 50 MG tablet, Take 50 mg by mouth 2 (two) times daily., Disp: , Rfl:  .  carboxymethylcellulose (REFRESH PLUS) 0.5 % SOLN, Apply to eye., Disp: , Rfl:  .  celecoxib (CELEBREX) 200 MG capsule, Take 200 mg by mouth daily., Disp: , Rfl:  .  cetirizine (ZYRTEC) 10 MG tablet, Take by mouth., Disp: , Rfl:  .  clorazepate (TRANXENE) 3.75 MG tablet, Take 3.75 mg by mouth daily as needed., Disp: , Rfl:  .  gabapentin (NEURONTIN) 400 MG capsule, Take 400 mg by mouth 3 (three) times daily., Disp: , Rfl:  .  losartan (COZAAR) 100 MG tablet, Take 100 mg by mouth  daily., Disp: , Rfl:  .  Multiple Vitamin (MULTI-VITAMINS) TABS, Take 1 tablet by mouth daily., Disp: , Rfl:  .  Pancrelipase, Lip-Prot-Amyl, 40000-126000 units CPEP, Take 1 Dose by mouth in the morning, at noon, in the evening, and at bedtime., Disp: , Rfl:  .  pantoprazole (PROTONIX) 40 MG tablet, Take 40 mg by mouth daily., Disp: , Rfl:  .  prochlorperazine (COMPAZINE) 10 MG tablet, Take 1 tablet (10 mg total) by mouth every 6 (six) hours as needed (Nausea or vomiting)., Disp: 30 tablet, Rfl: 1 .  traZODone (DESYREL) 100 MG tablet, Take 100 mg by mouth at bedtime., Disp: , Rfl:  .  amLODipine (NORVASC) 2.5 MG tablet, Take 2.5 mg by mouth daily. (Patient not taking: No sig reported), Disp: , Rfl:  .  cephALEXin (KEFLEX) 500 MG capsule, Take 500 mg by mouth 2 (two) times daily. (Patient not taking: Reported on 08/02/2020), Disp: , Rfl:  .  ciprofloxacin (CIPRO) 250 MG tablet, Take 250 mg by mouth 2 (two) times daily. (Patient not taking: Reported on 08/02/2020), Disp: , Rfl:  .  cyclobenzaprine (FLEXERIL) 10 MG tablet, TAKE 1 TABLET (10 MG TOTAL) BY MOUTH 3 (THREE) TIMES DAILY AS NEEDED FOR MUSCLE SPASMS. (Patient not taking: No sig reported), Disp: , Rfl:  .  lidocaine-prilocaine (EMLA) cream, Apply small amount of cream over port site 1 1/2 hours before each treatment, place saran wrap over cream to protect clothin (Patient not taking: No sig reported), Disp: 30 g, Rfl: 3 .  LORazepam (ATIVAN) 0.5 MG tablet, Take 1 tablet (0.5 mg total) by mouth every 6 (six) hours as needed (Nausea or vomiting)., Disp: 30 tablet, Rfl: 0 .  ondansetron (ZOFRAN) 8 MG tablet, Take 1 tablet (8 mg total) by mouth 2 (two) times daily as needed (Nausea or vomiting)., Disp: 30 tablet, Rfl: 1 No current facility-administered medications for this visit.  Facility-Administered Medications Ordered in Other Visits:  .  0.9 %  sodium chloride infusion, , Intravenous, Once, Burns, Jennifer E, NP .  alteplase (CATHFLO ACTIVASE)  injection 2 mg, 2 mg, Intracatheter, Once PRN, Faythe Casa E, NP .  heparin lock flush 100 unit/mL, 500 Units, Intracatheter, Once PRN, Sindy Guadeloupe, MD .  heparin lock flush 100 unit/mL, 250 Units, Intracatheter, Once PRN, Faythe Casa E, NP .  heparin lock flush 100 unit/mL, 500 Units, Intravenous, Once, Sindy Guadeloupe, MD .  sodium chloride flush (NS) 0.9 % injection 10 mL, 10 mL, Intravenous, PRN, Sindy Guadeloupe, MD, 10 mL at 06/06/20 1039 .  sodium chloride flush (NS) 0.9 % injection 10 mL, 10 mL, Intracatheter, PRN, Sindy Guadeloupe, MD .  sodium chloride flush (NS) 0.9 % injection 10 mL, 10 mL, Intracatheter, PRN, Faythe Casa E, NP, 10 mL at 06/20/20 1030 .  sodium chloride flush (NS) 0.9 % injection 3 mL, 3 mL, Intracatheter, PRN, Jacquelin Hawking, NP  Physical exam:  Vitals:   08/02/20 1000  BP: (!) 154/56  Pulse: (!) 59  Resp: 16  Temp: 97.7 F (36.5 C)  SpO2: 100%  Weight: 113 lb (51.3 kg)   Physical Exam Constitutional:      General: She is not in acute distress.    Comments: Appears somewhat frail and fatigued  Cardiovascular:     Rate and Rhythm: Normal rate and regular rhythm.     Heart sounds: Normal heart sounds.  Pulmonary:     Effort: Pulmonary effort is normal.     Breath sounds: Normal breath sounds.  Abdominal:     General: Bowel sounds are normal.     Palpations: Abdomen is soft.  Skin:    General: Skin is warm and dry.  Neurological:     Mental Status: She is alert and oriented to person, place, and time.      CMP Latest Ref Rng & Units 08/02/2020  Glucose 70 - 99 mg/dL 132(H)  BUN 8 - 23 mg/dL 13  Creatinine 0.44 - 1.00 mg/dL 0.97  Sodium 135 - 145 mmol/L 136  Potassium 3.5 - 5.1 mmol/L 4.1  Chloride 98 - 111 mmol/L 101  CO2 22 - 32 mmol/L 25  Calcium 8.9 - 10.3 mg/dL 8.9  Total Protein 6.5 - 8.1 g/dL 6.5  Total Bilirubin 0.3 - 1.2 mg/dL 0.6  Alkaline Phos 38 - 126 U/L 62  AST 15 - 41 U/L 26  ALT 0 - 44 U/L 13   CBC Latest Ref  Rng & Units 08/02/2020  WBC 4.0 - 10.5 K/uL 4.9  Hemoglobin 12.0 - 15.0 g/dL 10.3(L)  Hematocrit 36.0 - 46.0 % 31.0(L)  Platelets  150 - 400 K/uL 329     Assessment and plan- Patient is a 80 y.o. female withpancreatic adenocarcinoma stage IBT2 N0 M0 s/p Whipple surgery. She is here for on treatment assessment prior to cycle 6-day 1 of gemcitabine chemotherapy  Counts okay to proceed with cycle 6-day 1 of gemcitabine chemotherapy today.  She will proceed with day 8 chemotherapy next week.  She will be seen by covering MD/NP in 3 weeks for cycle 7-day 1.  Plan is to complete followed by repeat scans.  She had recent CT chest abdomen and pelvis with contrast in April 2022 at [facility] which did not show any evidence of recurrence.  Plan to repeat scans in July.  Given that she had a positive uncinate margin at the time of surgery ideally she would benefit from concurrent chemoradiation following 6 months of chemotherapy.  However patient is feeling fatigued with this present regimen and also struggling with on and off nausea.  She is not keen on pursuing chemoradiation at this time.  Chemo induced nausea: We will refill Zofran today  Anxiety: We will refill Ativan  Chemo induced anemia: Stable continue to monitor   Visit Diagnosis 1. Malignant neoplasm of head of pancreas ([place])   2. Encounter for antineoplastic chemotherapy   3. Antineoplastic chemotherapy induced anemia      Dr. Randa Evens, MD, MPH [facility] at [facility] [phone] 08/02/2020 1:03 PM

## 2020-08-02 NOTE — Progress Notes (Signed)
Refill for zofran

## 2020-08-02 NOTE — Patient Instructions (Signed)
CANCER CENTER [place] [facility]  Discharge Instructions: Thank you for choosing [facility] to provide your oncology and hematology care.  If you have a lab appointment with the Cancer Center, please go directly to the Cancer Center and check in at the registration area.  Wear comfortable clothing and clothing appropriate for easy access to any Portacath or PICC line.   We strive to give you quality time with your provider. You may need to reschedule your appointment if you arrive late (15 or more minutes).  Arriving late affects you and other patients whose appointments are after yours.  Also, if you miss three or more appointments without notifying the office, you may be dismissed from the clinic at the provider's discretion.      For prescription refill requests, have your pharmacy contact our office and allow 72 hours for refills to be completed.    Today you received the following chemotherapy and/or immunotherapy agents Gemzar   To help prevent nausea and vomiting after your treatment, we encourage you to take your nausea medication as directed.  BELOW ARE SYMPTOMS THAT SHOULD BE REPORTED IMMEDIATELY: *FEVER GREATER THAN 100.4 F (38 C) OR HIGHER *CHILLS OR SWEATING *NAUSEA AND VOMITING THAT IS NOT CONTROLLED WITH YOUR NAUSEA MEDICATION *UNUSUAL SHORTNESS OF BREATH *UNUSUAL BRUISING OR BLEEDING *URINARY PROBLEMS (pain or burning when urinating, or frequent urination) *BOWEL PROBLEMS (unusual diarrhea, constipation, pain near the anus) TENDERNESS IN MOUTH AND THROAT WITH OR WITHOUT PRESENCE OF ULCERS (sore throat, sores in mouth, or a toothache) UNUSUAL RASH, SWELLING OR PAIN  UNUSUAL VAGINAL DISCHARGE OR ITCHING   Items with * indicate a potential emergency and should be followed up as soon as possible or go to the Emergency Department if any problems should occur.  Please show the CHEMOTHERAPY ALERT CARD or IMMUNOTHERAPY ALERT CARD at check-in to the  Emergency Department and triage nurse.  Should you have questions after your visit or need to cancel or reschedule your appointment, please contact CANC[facility] CENTER [place] [facility]  [phone] and follow the prompts.  Office hours are 8:00 a.m. to 4:30 p.m. Monday - Friday. Please note that voicemails left after 4:00 p.m. may not be returned until the following business day.  We are closed weekends and major holidays. You have access to a nurse at all times for urgent questions. Please call the main number to the clinic [phone] and follow the prompts.  For any non-urgent questions, you may also contact your provider using MyChart. We now offer e-Visits for anyone 18 and older to request care online for non-urgent symptoms. For details visit [email or web address].   Also download the MyChart app! Go to the app store, search "MyChart", open the app, select [place], and log in with your MyChart username and password.  Due to Covid, a mask is required upon entering the hospital/clinic. If you do not have a mask, one will be given to you upon arrival. For doctor visits, patients may have 1 support person aged 18 or older with them. For treatment visits, patients cannot have anyone with them due to current Covid guidelines and our immunocompromised population.  

## 2020-08-02 NOTE — Progress Notes (Signed)
Nutrition Follow-up:   Patient with pancreatic cancer.  S/p whipple on 01/2020 at [facility].  Patient receiving adjuvant chemotherapy.    Met with patient during infusion.  Patient reports that she is feeling nauseated.  Agreeable to talking with RD.  Patient says that she has been trying to eat more peanut butter.  Had a good supper last night of chicken pie, green beans, macaroni and cheese and ice cream.  Patient had tried some strawberries recently.  Also tried some chocolate carnation breakfast essentials powder mixed with milk.  Felt like is caused heartburn.    Medications: reviewed  Labs: reviewed  Anthropometrics:   Weight 113 lb today  111 lb on 4/29  118 lb on 1/28 118 lb on 1/20 121 on 12/30   NUTRITION DIAGNOSIS: Inadequate oral intake continues    INTERVENTION:  Patient requested samples of shakes today.  "Maybe I need to try them again." They made me nauseated before."  RD got patient samples and wanted to discuss the differences in shakes but patient did not want to talk about them or look at bottle due to nausea.      MONITORING, EVALUATION, GOAL: weight trends, intake   NEXT VISIT: June 3 during infusion (Friday)  Shayona Hibbitts B. Zenia Resides, [place], [place] Registered Dietitian [phone] (mobile)

## 2020-08-05 ENCOUNTER — Ambulatory Visit: Payer: Medicare Other | Admitting: Oncology

## 2020-08-05 ENCOUNTER — Other Ambulatory Visit: Payer: Medicare Other

## 2020-08-05 ENCOUNTER — Ambulatory Visit: Payer: Medicare Other

## 2020-08-09 ENCOUNTER — Other Ambulatory Visit: Payer: Self-pay | Admitting: Nurse Practitioner

## 2020-08-09 ENCOUNTER — Other Ambulatory Visit: Payer: Self-pay

## 2020-08-09 ENCOUNTER — Inpatient Hospital Stay: Payer: Medicare Other

## 2020-08-09 VITALS — BP 162/58 | HR 57 | Temp 97.8°F | Resp 17 | Wt 113.0 lb

## 2020-08-09 DIAGNOSIS — C250 Malignant neoplasm of head of pancreas: Secondary | ICD-10-CM | POA: Diagnosis not present

## 2020-08-09 LAB — COMPREHENSIVE METABOLIC PANEL
ALT: 15 U/L (ref 0–44)
AST: 29 U/L (ref 15–41)
Albumin: 3.7 g/dL (ref 3.5–5.0)
Alkaline Phosphatase: 74 U/L (ref 38–126)
Anion gap: 9 (ref 5–15)
BUN: 15 mg/dL (ref 8–23)
CO2: 26 mmol/L (ref 22–32)
Calcium: 8.8 mg/dL — ABNORMAL LOW (ref 8.9–10.3)
Chloride: 100 mmol/L (ref 98–111)
Creatinine, Ser: 0.92 mg/dL (ref 0.44–1.00)
GFR, Estimated: >60 mL/min (ref >60)
Glucose, Bld: 123 mg/dL — ABNORMAL HIGH (ref 70–99)
Potassium: 4.2 mmol/L (ref 3.5–5.1)
Sodium: 135 mmol/L (ref 135–145)
Total Bilirubin: 0.7 mg/dL (ref 0.3–1.2)
Total Protein: 6.5 g/dL (ref 6.5–8.1)

## 2020-08-09 LAB — CBC WITH DIFFERENTIAL/PLATELET
Abs Immature Granulocytes: 0.01 10*3/uL (ref 0.00–0.07)
Basophils Absolute: 0.1 10*3/uL (ref 0.0–0.1)
Basophils Relative: 2 %
Eosinophils Absolute: 0.1 10*3/uL (ref 0.0–0.5)
Eosinophils Relative: 2 %
HCT: 30.2 % — ABNORMAL LOW (ref 36.0–46.0)
Hemoglobin: 10.1 g/dL — ABNORMAL LOW (ref 12.0–15.0)
Immature Granulocytes: 0 %
Lymphocytes Relative: 17 %
Lymphs Abs: 0.5 10*3/uL — ABNORMAL LOW (ref 0.7–4.0)
MCH: 33 pg (ref 26.0–34.0)
MCHC: 33.4 g/dL (ref 30.0–36.0)
MCV: 98.7 fL (ref 80.0–100.0)
Monocytes Absolute: 0.4 10*3/uL (ref 0.1–1.0)
Monocytes Relative: 12 %
Neutro Abs: 2 10*3/uL (ref 1.7–7.7)
Neutrophils Relative %: 67 %
Platelets: 513 10*3/uL — ABNORMAL HIGH (ref 150–400)
RBC: 3.06 MIL/uL — ABNORMAL LOW (ref 3.87–5.11)
RDW: 15.1 % (ref 11.5–15.5)
WBC: 2.9 10*3/uL — ABNORMAL LOW (ref 4.0–10.5)
nRBC: 0 % (ref 0.0–0.2)

## 2020-08-09 MED ORDER — SODIUM CHLORIDE 0.9 % IV SOLN
Freq: Once | INTRAVENOUS | Status: AC
  Filled 2020-08-09: qty 250

## 2020-08-09 MED ORDER — FLUCONAZOLE 150 MG PO TABS: 150.0000 mg | ORAL_TABLET | Freq: Every day | ORAL | 0 refills | Status: DC

## 2020-08-09 MED ORDER — PROCHLORPERAZINE MALEATE 10 MG PO TABS
10.0000 mg | ORAL_TABLET | Freq: Once | ORAL | Status: AC
  Administered 2020-08-09: 10 mg via ORAL
  Filled 2020-08-09: qty 1

## 2020-08-09 MED ORDER — HEPARIN SOD (PORK) LOCK FLUSH 100 UNIT/ML IV SOLN
INTRAVENOUS | Status: AC
  Filled 2020-08-09: qty 5

## 2020-08-09 MED ORDER — HEPARIN SOD (PORK) LOCK FLUSH 100 UNIT/ML IV SOLN
500.0000 [IU] | Freq: Once | INTRAVENOUS | Status: DC | PRN
  Filled 2020-08-09: qty 5

## 2020-08-09 MED ORDER — SODIUM CHLORIDE 0.9 % IV SOLN
800.0000 mg/m2 | Freq: Once | INTRAVENOUS | Status: AC
  Administered 2020-08-09: 1254 mg via INTRAVENOUS
  Filled 2020-08-09: qty 26.3

## 2020-08-09 MED ORDER — HEPARIN SOD (PORK) LOCK FLUSH 100 UNIT/ML IV SOLN
500.0000 [IU] | Freq: Once | INTRAVENOUS | Status: AC
  Administered 2020-08-09: 500 [IU] via INTRAVENOUS
  Filled 2020-08-09: qty 5

## 2020-08-09 MED ORDER — SODIUM CHLORIDE 0.9% FLUSH
10.0000 mL | INTRAVENOUS | Status: DC | PRN
  Administered 2020-08-09: 10 mL via INTRAVENOUS
  Filled 2020-08-09: qty 10

## 2020-08-09 NOTE — Progress Notes (Signed)
Patient complains of vaginal yeast infection symptoms. Will send diflucan to patient's pharmacy.

## 2020-08-09 NOTE — Progress Notes (Signed)
1130: Pt states she is sure she has a yeast infection, pt states she has had them in the past and knows how they feel. Per Dr. Janese Banks okay to proceed with Gemzar. Per Beckey Rutter NP prescription called into the pharmacy. Pt aware and educated to call clinic if symptoms worsens or does not improve. Pt verbalizes understanding.

## 2020-08-10 LAB — CANCER ANTIGEN 19-9: CA 19-9: 8 U/mL (ref 0–35)

## 2020-08-12 ENCOUNTER — Telehealth: Payer: Self-pay | Admitting: *Deleted

## 2020-08-12 ENCOUNTER — Inpatient Hospital Stay (HOSPITAL_BASED_OUTPATIENT_CLINIC_OR_DEPARTMENT_OTHER): Payer: Medicare Other | Admitting: Oncology

## 2020-08-12 ENCOUNTER — Inpatient Hospital Stay: Payer: Medicare Other

## 2020-08-12 VITALS — BP 141/53 | HR 69 | Resp 17

## 2020-08-12 DIAGNOSIS — R531 Weakness: Secondary | ICD-10-CM | POA: Diagnosis not present

## 2020-08-12 DIAGNOSIS — C250 Malignant neoplasm of head of pancreas: Secondary | ICD-10-CM | POA: Diagnosis not present

## 2020-08-12 DIAGNOSIS — R300 Dysuria: Secondary | ICD-10-CM

## 2020-08-12 DIAGNOSIS — N3000 Acute cystitis without hematuria: Secondary | ICD-10-CM | POA: Diagnosis not present

## 2020-08-12 DIAGNOSIS — D6481 Anemia due to antineoplastic chemotherapy: Secondary | ICD-10-CM | POA: Diagnosis not present

## 2020-08-12 DIAGNOSIS — R110 Nausea: Secondary | ICD-10-CM

## 2020-08-12 DIAGNOSIS — T451X5A Adverse effect of antineoplastic and immunosuppressive drugs, initial encounter: Secondary | ICD-10-CM

## 2020-08-12 LAB — CBC WITH DIFFERENTIAL/PLATELET
Abs Immature Granulocytes: 0.02 10*3/uL (ref 0.00–0.07)
Basophils Absolute: 0 10*3/uL (ref 0.0–0.1)
Basophils Relative: 0 %
Eosinophils Absolute: 0.1 10*3/uL (ref 0.0–0.5)
Eosinophils Relative: 3 %
HCT: 24 % — ABNORMAL LOW (ref 36.0–46.0)
Hemoglobin: 8.3 g/dL — ABNORMAL LOW (ref 12.0–15.0)
Immature Granulocytes: 0 %
Lymphocytes Relative: 4 %
Lymphs Abs: 0.2 10*3/uL — ABNORMAL LOW (ref 0.7–4.0)
MCH: 34.2 pg — ABNORMAL HIGH (ref 26.0–34.0)
MCHC: 34.6 g/dL (ref 30.0–36.0)
MCV: 98.8 fL (ref 80.0–100.0)
Monocytes Absolute: 0.1 10*3/uL (ref 0.1–1.0)
Monocytes Relative: 2 %
Neutro Abs: 4.4 10*3/uL (ref 1.7–7.7)
Neutrophils Relative %: 91 %
Platelets: 288 10*3/uL (ref 150–400)
RBC: 2.43 MIL/uL — ABNORMAL LOW (ref 3.87–5.11)
RDW: 15.1 % (ref 11.5–15.5)
Smear Review: NORMAL
WBC: 4.9 10*3/uL (ref 4.0–10.5)
nRBC: 0 % (ref 0.0–0.2)

## 2020-08-12 LAB — URINALYSIS, COMPLETE (UACMP) WITH MICROSCOPIC
Bilirubin Urine: NEGATIVE
Glucose, UA: NEGATIVE mg/dL
Hgb urine dipstick: NEGATIVE
Ketones, ur: NEGATIVE mg/dL
Leukocytes,Ua: NEGATIVE
Nitrite: POSITIVE — AB
Protein, ur: NEGATIVE mg/dL
Specific Gravity, Urine: 1.011 (ref 1.005–1.030)
pH: 5 (ref 5.0–8.0)

## 2020-08-12 LAB — COMPREHENSIVE METABOLIC PANEL
ALT: 25 U/L (ref 0–44)
AST: 32 U/L (ref 15–41)
Albumin: 3.1 g/dL — ABNORMAL LOW (ref 3.5–5.0)
Alkaline Phosphatase: 79 U/L (ref 38–126)
Anion gap: 8 (ref 5–15)
BUN: 26 mg/dL — ABNORMAL HIGH (ref 8–23)
CO2: 25 mmol/L (ref 22–32)
Calcium: 8.3 mg/dL — ABNORMAL LOW (ref 8.9–10.3)
Chloride: 97 mmol/L — ABNORMAL LOW (ref 98–111)
Creatinine, Ser: 1.41 mg/dL — ABNORMAL HIGH (ref 0.44–1.00)
GFR, Estimated: 38 mL/min — ABNORMAL LOW (ref >60)
Glucose, Bld: 141 mg/dL — ABNORMAL HIGH (ref 70–99)
Potassium: 4.5 mmol/L (ref 3.5–5.1)
Sodium: 130 mmol/L — ABNORMAL LOW (ref 135–145)
Total Bilirubin: 1.3 mg/dL — ABNORMAL HIGH (ref 0.3–1.2)
Total Protein: 5.7 g/dL — ABNORMAL LOW (ref 6.5–8.1)

## 2020-08-12 MED ORDER — SODIUM CHLORIDE 0.9% FLUSH
10.0000 mL | INTRAVENOUS | Status: AC | PRN
  Administered 2020-08-12: 10 mL via INTRAVENOUS
  Filled 2020-08-12: qty 10

## 2020-08-12 MED ORDER — CIPROFLOXACIN HCL 250 MG PO TABS: 250.0000 mg | ORAL_TABLET | Freq: Two times a day (BID) | ORAL | 0 refills | Status: DC

## 2020-08-12 MED ORDER — ONDANSETRON HCL 4 MG/2ML IJ SOLN
8.0000 mg | Freq: Once | INTRAMUSCULAR | Status: AC
Start: 2020-08-12 — End: 2020-08-12
  Administered 2020-08-12: 8 mg via INTRAVENOUS
  Filled 2020-08-12: qty 4

## 2020-08-12 MED ORDER — SODIUM CHLORIDE 0.9 % IV SOLN
Freq: Once | INTRAVENOUS | Status: AC
  Filled 2020-08-12: qty 250

## 2020-08-12 MED ORDER — HEPARIN SOD (PORK) LOCK FLUSH 100 UNIT/ML IV SOLN
500.0000 [IU] | Freq: Once | INTRAVENOUS | Status: AC
  Administered 2020-08-12: 500 [IU] via INTRAVENOUS
  Filled 2020-08-12: qty 5

## 2020-08-12 MED ORDER — SODIUM CHLORIDE 0.9 % IV SOLN: 8.0000 mg | Freq: Once | INTRAVENOUS | Status: DC

## 2020-08-12 MED ORDER — DEXAMETHASONE SODIUM PHOSPHATE 10 MG/ML IJ SOLN
10.0000 mg | Freq: Once | INTRAMUSCULAR | Status: AC
  Administered 2020-08-12: 10 mg via INTRAVENOUS
  Filled 2020-08-12: qty 1

## 2020-08-12 MED ORDER — HYDROCORTISONE ACETATE 1 % EX CREA: 1.0000 "application " | TOPICAL_CREAM | Freq: Three times a day (TID) | CUTANEOUS | 0 refills | Status: AC | PRN

## 2020-08-12 NOTE — Telephone Encounter (Signed)
She is coming in today at 1230 to be seen  Faythe Casa, NP 08/12/2020 11:32 AM

## 2020-08-12 NOTE — Progress Notes (Signed)
On    Symptom Management Consult note [facility]  [phone] [phone]  Patient Care Team: Baxter Hire, MD as PCP - General (Internal Medicine)   Name of the patient: Emily Harding  [id number]  Jul 23, 1940   Date of visit: 08/12/2020   Diagnosis-pancreatic carcinoma status post Whipple  Chief complaint/ Reason for visit-vaginal burning and weakness.  Heme/Onc history: Emily Harding is a 80 year old female with past medical history significant for hypertension, GERD, hyperlipidemia, fibromyalgia, breast cancer with diagnosis of pancreatic adenocarcinoma status post Whipple (October 2021) at [facility].  Noted to have uncinate margin positive for tumor.  0 out of 18 lymph nodes positive for malignancy.  Recommendations were for adjuvant chemo followed by chemoradiation.  She started single agent gemcitabine on 04/11/2020.  She is status post 7 cycles last given on 08/09/2020.  She reports several days of vaginal burning and low back pain.  Work-up to date shows a negative urinalysis and culture.  She was treated for a yeast infection and given a prescription of Diflucan on 08/09/2020.  Interval history-she presents today for persistent vaginal burning, weakness and back pain.  Previously negative for a urinary tract infection on 08/09/2020.  Was prescribed Diflucan on 08/09/2020 for presumed yeast infection.  Symptoms have not improved since initiating medication.  She also tried AZO OTC without significant change.  Denies fever.  Reports feeling very weak despite eating and drinking well.  Feels her legs will "buckle" when she tries to walk.  Reports some incontinent episodes.  Wears depends.   ECOG FS:1 - Symptomatic but completely ambulatory  Review of systems- Review of Systems  Constitutional: Positive for malaise/fatigue. Negative for chills, fever and weight loss.  HENT: Negative for congestion, ear pain and tinnitus.   Eyes: Negative.  Negative  for blurred vision and double vision.  Respiratory: Negative.  Negative for cough, sputum production and shortness of breath.   Cardiovascular: Negative.  Negative for chest pain, palpitations and leg swelling.  Gastrointestinal: Negative.  Negative for abdominal pain, constipation, diarrhea, nausea and vomiting.  Genitourinary: Positive for dysuria, frequency and urgency.       Pelvic pressure  Musculoskeletal: Negative for back pain and falls.  Skin: Negative.  Negative for rash.  Neurological: Positive for weakness. Negative for headaches.  Endo/Heme/Allergies: Negative.  Does not bruise/bleed easily.  Psychiatric/Behavioral: Negative for depression. The patient is nervous/anxious. The patient does not have insomnia.      Current treatment- s/p 7 cycles single agent gemcitabine  Allergies  Allergen Reactions  . Hydromorphone Hcl Itching  . Azithromycin   . Codeine Sulfate Other (See Comments)  . Doxycycline Calcium Other (See Comments)  . Duloxetine Nausea Only  . Erythromycin Ethylsuccinate Other (See Comments)  . Macrolides And Ketolides Other (See Comments)  . Nitrofurantoin     Other reaction(s): Unknown Other reaction(s): Abdominal Pain Other reaction(s): Unknown  . Sulfa Antibiotics      Past Medical History:  Diagnosis Date  . Anxiety   . Arthritis   . Breast cancer ([place]) 1993   RT MASTECTOMY  . Heartburn   . HTN (hypertension)   . Pancreatic cancer ([place])   . Personal history of chemotherapy   . S/P chemotherapy, time since greater than 12 weeks 1993   BREAST CA     Past Surgical History:  Procedure Laterality Date  . ABDOMINAL HYSTERECTOMY    . colon polyp removal    . GALLBLADDER SURGERY    . LAPAROSCOPIC OOPHERECTOMY  1978  .  MASTECTOMY Right 1993   BREAST CA  . PANCREAS SURGERY N/A 01/04/2020   Per patient removed head of pancreas due to cancer  . PORTA CATH INSERTION N/A 04/04/2020   Procedure: PORTA CATH INSERTION;  Surgeon: Algernon Huxley, MD;   Location: [facility];  Service: Cardiovascular;  Laterality: N/A;  . removal of fallopian tubes  1989    Social History   Socioeconomic History  . Marital status: Widowed    Spouse name: Not on file  . Number of children: Not on file  . Years of education: Not on file  . Highest education level: Not on file  Occupational History  . Not on file  Tobacco Use  . Smoking status: Never Smoker  . Smokeless tobacco: Never Used  Vaping Use  . Vaping Use: Never used  Substance and Sexual Activity  . Alcohol use: No  . Drug use: No  . Sexual activity: Not Currently  Other Topics Concern  . Not on file  Social History Narrative  . Not on file   Social Determinants of Health   Financial Resource Strain: Not on file  Food Insecurity: Not on file  Transportation Needs: Not on file  Physical Activity: Not on file  Stress: Not on file  Social Connections: Not on file  Intimate Partner Violence: Not on file    Family History  Problem Relation Age of Onset  . Breast cancer Neg Hx   . Kidney cancer Neg Hx   . Bladder Cancer Neg Hx   . Prostate cancer Neg Hx      Current Outpatient Medications:  .  aspirin EC 81 MG tablet, Take 81 mg by mouth daily., Disp: , Rfl:  .  atenolol (TENORMIN) 50 MG tablet, Take 50 mg by mouth 2 (two) times daily., Disp: , Rfl:  .  carboxymethylcellulose (REFRESH PLUS) 0.5 % SOLN, Apply to eye., Disp: , Rfl:  .  celecoxib (CELEBREX) 200 MG capsule, Take 200 mg by mouth daily., Disp: , Rfl:  .  ciprofloxacin (CIPRO) 250 MG tablet, Take 1 tablet (250 mg total) by mouth 2 (two) times daily., Disp: 14 tablet, Rfl: 0 .  fluconazole (DIFLUCAN) 150 MG tablet, Take 1 tablet (150 mg total) by mouth daily. Three days later, take second tablet., Disp: 2 tablet, Rfl: 0 .  gabapentin (NEURONTIN) 400 MG capsule, Take 400 mg by mouth 3 (three) times daily., Disp: , Rfl:  .  Hydrocortisone Acetate (VAGISIL) 1 % CREA, Apply 1 application topically 3 (three)  times daily as needed., Disp: 15 g, Rfl: 0 .  lidocaine-prilocaine (EMLA) cream, Apply small amount of cream over port site 1 1/2 hours before each treatment, place saran wrap over cream to protect clothin, Disp: 30 g, Rfl: 3 .  LORazepam (ATIVAN) 0.5 MG tablet, Take 1 tablet (0.5 mg total) by mouth every 6 (six) hours as needed (Nausea or vomiting)., Disp: 30 tablet, Rfl: 0 .  losartan (COZAAR) 100 MG tablet, Take 100 mg by mouth daily., Disp: , Rfl:  .  Multiple Vitamin (MULTI-VITAMINS) TABS, Take 1 tablet by mouth daily., Disp: , Rfl:  .  ondansetron (ZOFRAN) 8 MG tablet, Take 1 tablet (8 mg total) by mouth 2 (two) times daily as needed (Nausea or vomiting)., Disp: 30 tablet, Rfl: 1 .  Pancrelipase, Lip-Prot-Amyl, 40000-126000 units CPEP, Take 1 Dose by mouth in the morning, at noon, in the evening, and at bedtime., Disp: , Rfl:  .  pantoprazole (PROTONIX) 40 MG tablet, Take  40 mg by mouth daily., Disp: , Rfl:  .  prochlorperazine (COMPAZINE) 10 MG tablet, Take 1 tablet (10 mg total) by mouth every 6 (six) hours as needed (Nausea or vomiting)., Disp: 30 tablet, Rfl: 1 .  traZODone (DESYREL) 100 MG tablet, Take 100 mg by mouth at bedtime., Disp: , Rfl:  .  amLODipine (NORVASC) 2.5 MG tablet, Take 2.5 mg by mouth daily. (Patient not taking: No sig reported), Disp: , Rfl:  .  cephALEXin (KEFLEX) 500 MG capsule, Take 500 mg by mouth 2 (two) times daily. (Patient not taking: No sig reported), Disp: , Rfl:  .  cetirizine (ZYRTEC) 10 MG tablet, Take by mouth. (Patient not taking: Reported on 08/12/2020), Disp: , Rfl:  .  ciprofloxacin (CIPRO) 250 MG tablet, Take 250 mg by mouth 2 (two) times daily. (Patient not taking: No sig reported), Disp: , Rfl:  .  clorazepate (TRANXENE) 3.75 MG tablet, Take 3.75 mg by mouth daily as needed. (Patient not taking: Reported on 08/12/2020), Disp: , Rfl:  .  cyclobenzaprine (FLEXERIL) 10 MG tablet, TAKE 1 TABLET (10 MG TOTAL) BY MOUTH 3 (THREE) TIMES DAILY AS NEEDED FOR  MUSCLE SPASMS. (Patient not taking: No sig reported), Disp: , Rfl:  No current facility-administered medications for this visit.  Facility-Administered Medications Ordered in Other Visits:  .  0.9 %  sodium chloride infusion, , Intravenous, Once, Deira Shimer E, NP .  alteplase (CATHFLO ACTIVASE) injection 2 mg, 2 mg, Intracatheter, Once PRN, Faythe Casa E, NP .  heparin lock flush 100 unit/mL, 500 Units, Intracatheter, Once PRN, Sindy Guadeloupe, MD .  heparin lock flush 100 unit/mL, 250 Units, Intracatheter, Once PRN, Faythe Casa E, NP .  heparin lock flush 100 unit/mL, 500 Units, Intravenous, Once, Sindy Guadeloupe, MD .  sodium chloride flush (NS) 0.9 % injection 10 mL, 10 mL, Intravenous, PRN, Sindy Guadeloupe, MD, 10 mL at 06/06/20 1039 .  sodium chloride flush (NS) 0.9 % injection 10 mL, 10 mL, Intracatheter, PRN, Sindy Guadeloupe, MD .  sodium chloride flush (NS) 0.9 % injection 10 mL, 10 mL, Intracatheter, PRN, Faythe Casa E, NP, 10 mL at 06/20/20 1030 .  sodium chloride flush (NS) 0.9 % injection 10 mL, 10 mL, Intravenous, PRN, Faythe Casa E, NP, 10 mL at 08/12/20 1335 .  sodium chloride flush (NS) 0.9 % injection 3 mL, 3 mL, Intracatheter, PRN, Jacquelin Hawking, NP  Physical exam: There were no vitals filed for this visit. Physical Exam Constitutional:      Appearance: Normal appearance.  HENT:     Head: Normocephalic and atraumatic.  Eyes:     Pupils: Pupils are equal, round, and reactive to light.  Cardiovascular:     Rate and Rhythm: Normal rate and regular rhythm.     Heart sounds: Normal heart sounds. No murmur heard.   Pulmonary:     Effort: Pulmonary effort is normal.     Breath sounds: Normal breath sounds. No wheezing.  Abdominal:     General: Bowel sounds are normal. There is no distension.     Palpations: Abdomen is soft.     Tenderness: There is no abdominal tenderness.  Musculoskeletal:        General: Normal range of motion.     Cervical back:  Normal range of motion.  Skin:    General: Skin is warm and dry.     Findings: No rash.  Neurological:     Mental Status: She is alert and oriented  to person, place, and time.  Psychiatric:        Judgment: Judgment normal.      CMP Latest Ref Rng & Units 08/12/2020  Glucose 70 - 99 mg/dL 141(H)  BUN 8 - 23 mg/dL 26(H)  Creatinine 0.44 - 1.00 mg/dL 1.41(H)  Sodium 135 - 145 mmol/L 130(L)  Potassium 3.5 - 5.1 mmol/L 4.5  Chloride 98 - 111 mmol/L 97(L)  CO2 22 - 32 mmol/L 25  Calcium 8.9 - 10.3 mg/dL 8.3(L)  Total Protein 6.5 - 8.1 g/dL 5.7(L)  Total Bilirubin 0.3 - 1.2 mg/dL 1.3(H)  Alkaline Phos 38 - 126 U/L 79  AST 15 - 41 U/L 32  ALT 0 - 44 U/L 25   CBC Latest Ref Rng & Units 08/12/2020  WBC 4.0 - 10.5 K/uL 4.9  Hemoglobin 12.0 - 15.0 g/dL 8.3(L)  Hematocrit 36.0 - 46.0 % 24.0(L)  Platelets 150 - 400 K/uL 288    No images are attached to the encounter.  No results found.   Assessment and plan- Patient is a 80 y.o. female who presents for weakness and vaginal burning X 1 week.   Pancreatic adenocarcinoma: -Status post Whipple in October 2021 with uncinate margin positive for tumor. -Recommendations were for adjuvant chemotherapy followed by chemoradiation. -She is currently receiving adjuvant chemo with single agent gemcitabine.   Vaginal discomfort and burning with pelvic pressure: - Symptoms started approximately 1 week ago.  Patient is incontinent at times. -Initial urinalysis was negative for UTI. - Prescribed Diflucan for presumed yeast infection. - Has tried OTC vaginal cream and AZO without relief -We will repeat urinalysis, urine culture, CBC, CMP. - Urinalysis positive for nitrites with some bacteria. -She has several drug allergies including Bactrim and Macrobid.  Has used Cipro (renally dosed) in the past.  Will prescribe Cipro 250 mg twice a day x7 days.  -No previous cultures for comparison.  Hyponatremia: -Proceed with IV fluids today. -Labs from  08/12/2020 show creatinine of 1.41 with BUN of 26. - Asked patient to increase hydration.  Nausea: -Secondary to recent chemo treatment. -Patient will get Zofran 8 mg IV while in clinic today.  Anemia: -Chemo induced.  Hemoglobin is 8.3.  Disposition: -RTC on 08/23/2020 for labs, MD assessment and next cycle of gemcitabine.  Visit Diagnosis 1. Weakness   2. Dysuria     Patient expressed understanding and was in agreement with this plan. She also understands that She can call clinic at any time with any questions, concerns, or complaints.   Greater than 50% was spent in counseling and coordination of care with this patient including but not limited to discussion of the relevant topics above (See A&P) including, but not limited to diagnosis and management of acute and chronic medical conditions.   Thank you for allowing me to participate in the care of this very pleasant patient.    Jacquelin Hawking, NP [place] [facility] Cell - [phone] Pager- [phone] 08/12/2020 4:03 PM

## 2020-08-12 NOTE — Progress Notes (Signed)
Patient here for oncology follow-up appointment, expresses concerns of unsteadiness, nausea, constipation and back pain

## 2020-08-12 NOTE — Telephone Encounter (Signed)
Patient called requesting to be seen today stating that the medicine Dr Janese Banks gave her last week is not helping  And that she feels weak and can hardly walk. She states she has felt bad for a week now. Please advise

## 2020-08-12 NOTE — Progress Notes (Signed)
Pt received IV nausea med and steroid today. Received approximately 150 ml NS. NP calling in antibiotic for patient. Discharged to home. Accompanied by daughter via wheelchair.

## 2020-08-12 NOTE — Telephone Encounter (Signed)
Josh- please see my note. Can you call her?

## 2020-08-14 ENCOUNTER — Telehealth: Payer: Self-pay | Admitting: Oncology

## 2020-08-14 LAB — URINE CULTURE: Culture: <10000 — AB

## 2020-08-14 NOTE — Telephone Encounter (Signed)
Re: Follow-up   Called to check on patient and tolerance if antibiotics.  She appears to be tolerating the Cipro well.  Having episodes of hand tremors which is new.  Otherwise denies any side effects.  Continues to have incontinence.  States this appears worse since being diagnosed with a UTI.  She is saturating her pad overnight which is new.  Recommend she continue to monitor and refer to urology if this is persistent.  She is using the vaginal cream as needed for burning.  Patient to call clinic if symptoms worsen or fail to improve.  Faythe Casa, NP 08/14/2020 10:35 AM

## 2020-08-23 ENCOUNTER — Inpatient Hospital Stay: Payer: Medicare Other

## 2020-08-23 ENCOUNTER — Inpatient Hospital Stay (HOSPITAL_BASED_OUTPATIENT_CLINIC_OR_DEPARTMENT_OTHER): Payer: Medicare Other | Admitting: Oncology

## 2020-08-23 ENCOUNTER — Encounter: Payer: Self-pay | Admitting: Oncology

## 2020-08-23 ENCOUNTER — Inpatient Hospital Stay: Payer: Medicare Other | Attending: Oncology

## 2020-08-23 VITALS — BP 130/52 | HR 62 | Temp 98.0°F | Resp 12 | Wt 111.9 lb

## 2020-08-23 DIAGNOSIS — Z9221 Personal history of antineoplastic chemotherapy: Secondary | ICD-10-CM | POA: Insufficient documentation

## 2020-08-23 DIAGNOSIS — Z90710 Acquired absence of both cervix and uterus: Secondary | ICD-10-CM | POA: Insufficient documentation

## 2020-08-23 DIAGNOSIS — N898 Other specified noninflammatory disorders of vagina: Secondary | ICD-10-CM | POA: Diagnosis not present

## 2020-08-23 DIAGNOSIS — C250 Malignant neoplasm of head of pancreas: Secondary | ICD-10-CM

## 2020-08-23 DIAGNOSIS — E860 Dehydration: Secondary | ICD-10-CM | POA: Insufficient documentation

## 2020-08-23 DIAGNOSIS — Z9011 Acquired absence of right breast and nipple: Secondary | ICD-10-CM | POA: Diagnosis not present

## 2020-08-23 DIAGNOSIS — R55 Syncope and collapse: Secondary | ICD-10-CM | POA: Diagnosis not present

## 2020-08-23 DIAGNOSIS — Z853 Personal history of malignant neoplasm of breast: Secondary | ICD-10-CM | POA: Insufficient documentation

## 2020-08-23 DIAGNOSIS — Z7982 Long term (current) use of aspirin: Secondary | ICD-10-CM | POA: Diagnosis not present

## 2020-08-23 DIAGNOSIS — K219 Gastro-esophageal reflux disease without esophagitis: Secondary | ICD-10-CM | POA: Insufficient documentation

## 2020-08-23 DIAGNOSIS — I10 Essential (primary) hypertension: Secondary | ICD-10-CM | POA: Diagnosis not present

## 2020-08-23 DIAGNOSIS — Z79899 Other long term (current) drug therapy: Secondary | ICD-10-CM | POA: Diagnosis not present

## 2020-08-23 DIAGNOSIS — R300 Dysuria: Secondary | ICD-10-CM

## 2020-08-23 DIAGNOSIS — Z5111 Encounter for antineoplastic chemotherapy: Secondary | ICD-10-CM | POA: Diagnosis present

## 2020-08-23 DIAGNOSIS — Z90411 Acquired partial absence of pancreas: Secondary | ICD-10-CM | POA: Insufficient documentation

## 2020-08-23 LAB — URINALYSIS, COMPLETE (UACMP) WITH MICROSCOPIC
Bacteria, UA: NONE SEEN
Bilirubin Urine: NEGATIVE
Glucose, UA: NEGATIVE mg/dL
Hgb urine dipstick: NEGATIVE
Ketones, ur: NEGATIVE mg/dL
Leukocytes,Ua: NEGATIVE
Nitrite: NEGATIVE
Protein, ur: NEGATIVE mg/dL
Specific Gravity, Urine: 1.016 (ref 1.005–1.030)
pH: 5 (ref 5.0–8.0)

## 2020-08-23 LAB — COMPREHENSIVE METABOLIC PANEL
ALT: 12 U/L (ref 0–44)
AST: 25 U/L (ref 15–41)
Albumin: 3.7 g/dL (ref 3.5–5.0)
Alkaline Phosphatase: 66 U/L (ref 38–126)
Anion gap: 10 (ref 5–15)
BUN: 14 mg/dL (ref 8–23)
CO2: 25 mmol/L (ref 22–32)
Calcium: 8.9 mg/dL (ref 8.9–10.3)
Chloride: 103 mmol/L (ref 98–111)
Creatinine, Ser: 0.89 mg/dL (ref 0.44–1.00)
GFR, Estimated: >60 mL/min (ref >60)
Glucose, Bld: 121 mg/dL — ABNORMAL HIGH (ref 70–99)
Potassium: 4.3 mmol/L (ref 3.5–5.1)
Sodium: 138 mmol/L (ref 135–145)
Total Bilirubin: 0.7 mg/dL (ref 0.3–1.2)
Total Protein: 6.7 g/dL (ref 6.5–8.1)

## 2020-08-23 LAB — CBC WITH DIFFERENTIAL/PLATELET
Abs Immature Granulocytes: 0.02 10*3/uL (ref 0.00–0.07)
Basophils Absolute: 0.1 10*3/uL (ref 0.0–0.1)
Basophils Relative: 1 %
Eosinophils Absolute: 0.3 10*3/uL (ref 0.0–0.5)
Eosinophils Relative: 5 %
HCT: 29.9 % — ABNORMAL LOW (ref 36.0–46.0)
Hemoglobin: 10.1 g/dL — ABNORMAL LOW (ref 12.0–15.0)
Immature Granulocytes: 0 %
Lymphocytes Relative: 8 %
Lymphs Abs: 0.6 10*3/uL — ABNORMAL LOW (ref 0.7–4.0)
MCH: 33.7 pg (ref 26.0–34.0)
MCHC: 33.8 g/dL (ref 30.0–36.0)
MCV: 99.7 fL (ref 80.0–100.0)
Monocytes Absolute: 0.6 10*3/uL (ref 0.1–1.0)
Monocytes Relative: 9 %
Neutro Abs: 5.6 10*3/uL (ref 1.7–7.7)
Neutrophils Relative %: 77 %
Platelets: 399 10*3/uL (ref 150–400)
RBC: 3 MIL/uL — ABNORMAL LOW (ref 3.87–5.11)
RDW: 16.4 % — ABNORMAL HIGH (ref 11.5–15.5)
WBC: 7.2 10*3/uL (ref 4.0–10.5)
nRBC: 0 % (ref 0.0–0.2)

## 2020-08-23 MED ORDER — SODIUM CHLORIDE 0.9 % IV SOLN
800.0000 mg/m2 | Freq: Once | INTRAVENOUS | Status: AC
  Administered 2020-08-23: 1254 mg via INTRAVENOUS
  Filled 2020-08-23: qty 26.3

## 2020-08-23 MED ORDER — HEPARIN SOD (PORK) LOCK FLUSH 100 UNIT/ML IV SOLN
500.0000 [IU] | Freq: Once | INTRAVENOUS | Status: AC
Start: 2020-08-23 — End: 2020-08-23
  Administered 2020-08-23: 500 [IU] via INTRAVENOUS
  Filled 2020-08-23: qty 5

## 2020-08-23 MED ORDER — SODIUM CHLORIDE 0.9% FLUSH
10.0000 mL | Freq: Once | INTRAVENOUS | Status: AC
  Administered 2020-08-23: 10 mL via INTRAVENOUS
  Filled 2020-08-23: qty 10

## 2020-08-23 MED ORDER — HEPARIN SOD (PORK) LOCK FLUSH 100 UNIT/ML IV SOLN
INTRAVENOUS | Status: AC
  Filled 2020-08-23: qty 5

## 2020-08-23 MED ORDER — SODIUM CHLORIDE 0.9 % IV SOLN
Freq: Once | INTRAVENOUS | Status: AC
  Filled 2020-08-23: qty 250

## 2020-08-23 MED ORDER — PROCHLORPERAZINE MALEATE 10 MG PO TABS
10.0000 mg | ORAL_TABLET | Freq: Once | ORAL | Status: AC
Start: 2020-08-23 — End: 2020-08-23
  Administered 2020-08-23: 10 mg via ORAL
  Filled 2020-08-23: qty 1

## 2020-08-23 NOTE — Progress Notes (Signed)
Nutrition Follow-up:  Patient with pancreatic cancer.  S/p whipple on 01/2020 at [facility].  Patient receiving adjuvant chemotherapy.   Met with patient during infusion.  Patient with recent UTI.  Reports that she tried the boost shake(strawberry) and it made her nauseated after she drank it.  Has not tried any other samples.  Patient eats oatmeal with milk but could not tolerate butter in it.  Lunch is usually chicken and rice soup or peanut butter toast (2 pieces).  Supper is usually chicken pie, macaroni and cheese and green beans. Will eat ice cream sometimes and fudge popsciles.  Concerned about eating too much sugar causing diabetes.    Reports that treatment makes her feel tired and not able to do what she wants to do.  Reports that she has 3 more treatment to go.  Hoping that she will be feeling better for grandson's wedding on August 13th.     Medications: reviewed  Labs: reviewed  Anthropometrics:   Weight decreased to 111 lb today  113 lb on 5/13 118 lb on 1/28 118 lb on 1/20 121 lb on 12/30   NUTRITION DIAGNOSIS: Inadequate oral intake continues    INTERVENTION:  Patient with certain foods that she can tolerate and does not like to eat other options for fear of nausea or GI upset.   Agreeable to trying egg salad for more protein.     MONITORING, EVALUATION, GOAL: weight trends, intake   NEXT VISIT: Friday, June 24 during infusion  Joli B. Allen, RD, LDN Registered Dietitian [phone] (mobile)     

## 2020-08-23 NOTE — Patient Instructions (Signed)
Replense for vaginal dryness. This is OTC.   I will call with Urinalysis results.   Start taking Pepcid 40 mg at bedtime.   Continue Protonix once daily.   Faythe Casa, NP 08/23/2020 1:09 PM

## 2020-08-23 NOTE — Progress Notes (Signed)
Hematology/Oncology Consult note [facility]  [phone] [phone]  Patient Care Team: Baxter Hire, MD as PCP - General (Internal Medicine)   Name of the patient: Emily Harding  [id number]  1940-10-22   Date of visit: 08/23/20  Diagnosis- pancreatic adenocarcinoma stage Ib T2 N0 M0 s/p Whipple surgery  Chief complaint/ Reason for visit-on treatment assessment prior to cycle 8-day 1 of adjuvant gemcitabine chemotherapy  Heme/Onc history: patient is a 80 year old female with an oncology history as follows: 1.On 11/22/2019 she presented with left lower quadrant abdominal pain and persistent nausea. CT abdomen and pelvis showed a 3.1 x 2.3 cm mass in the head of the pancreas with diffuse pancreatic ductal dilatation.CA 19-9 elevated at 166 2.EUS showed 2.0 x 1.9 cm mass in the head of pancreas with no vessel involvement. UT2N0. Pathology showed adenocarcinoma 3.CT chest showed 2 to 3 mm lung nodules with no evidence of distant metastatic disease. In October 2021 patient underwent Whipple resection with pathology demonstrating 3.1 cm adenocarcinoma with mucinous features. Uncinate margin positive for tumor. Tumor approaches less than 1 mm within cautery. Positive for PNI. 0 out of 18 lymph nodes positive for malignancy. 4.Patient evaluated by medical oncology at [facility] and was recommended adjuvant chemotherapy followed by chemoradiation  Multiple adjuvant regimens including gem Abraxane, gemcitabine and Xeloda and gemcitabine single agent discussed with patient and patient decided to proceed with single agent gemcitabine starting 04/11/2020   Interval history-continues to have some ongoing vaginal discomfort.  Dysuria has improved if not completely resolved.  She completed the antibiotics.  Endorses acid reflux-like symptoms.  States it is worse after her dinner.  Reports gas and belching.  Currently on pantoprazole 40 mg  daily.  ECOG PS- 1 Pain scale- 0 Opioid associated constipation- no  Review of systems- Review of Systems  Constitutional: Positive for malaise/fatigue. Negative for chills, fever and weight loss.  HENT: Negative for congestion, ear pain and tinnitus.   Eyes: Negative.  Negative for blurred vision and double vision.  Respiratory: Negative.  Negative for cough, sputum production and shortness of breath.   Cardiovascular: Negative.  Negative for chest pain, palpitations and leg swelling.  Gastrointestinal: Positive for heartburn. Negative for abdominal pain, constipation, diarrhea, nausea and vomiting.  Genitourinary: Negative for dysuria, frequency and urgency.       Vaginal dryness and irritation  Musculoskeletal: Negative for back pain and falls.  Skin: Negative.  Negative for rash.  Neurological: Negative.  Negative for weakness and headaches.  Endo/Heme/Allergies: Negative.  Does not bruise/bleed easily.  Psychiatric/Behavioral: Negative.  Negative for depression. The patient is not nervous/anxious and does not have insomnia.        Allergies  Allergen Reactions  . Hydromorphone Hcl Itching  . Azithromycin   . Codeine Sulfate Other (See Comments)  . Doxycycline Calcium Other (See Comments)  . Duloxetine Nausea Only  . Erythromycin Ethylsuccinate Other (See Comments)  . Macrolides And Ketolides Other (See Comments)  . Nitrofurantoin     Other reaction(s): Unknown Other reaction(s): Abdominal Pain Other reaction(s): Unknown  . Sulfa Antibiotics      Past Medical History:  Diagnosis Date  . Anxiety   . Arthritis   . Breast cancer ([place]) 1993   RT MASTECTOMY  . Heartburn   . HTN (hypertension)   . Pancreatic cancer ([place])   . Personal history of chemotherapy   . S/P chemotherapy, time since greater than 12 weeks [place]  Past Surgical History:  Procedure Laterality Date  . ABDOMINAL HYSTERECTOMY    . colon polyp removal    . GALLBLADDER SURGERY     . LAPAROSCOPIC OOPHERECTOMY  1978  . MASTECTOMY Right 1993   BREAST CA  . PANCREAS SURGERY N/A 01/04/2020   Per patient removed head of pancreas due to cancer  . PORTA CATH INSERTION N/A 04/04/2020   Procedure: PORTA CATH INSERTION;  Surgeon: Algernon Huxley, MD;  Location: [facility];  Service: Cardiovascular;  Laterality: N/A;  . removal of fallopian tubes  1989    Social History   Socioeconomic History  . Marital status: Widowed    Spouse name: Not on file  . Number of children: Not on file  . Years of education: Not on file  . Highest education level: Not on file  Occupational History  . Not on file  Tobacco Use  . Smoking status: Never Smoker  . Smokeless tobacco: Never Used  Vaping Use  . Vaping Use: Never used  Substance and Sexual Activity  . Alcohol use: No  . Drug use: No  . Sexual activity: Not Currently  Other Topics Concern  . Not on file  Social History Narrative  . Not on file   Social Determinants of Health   Financial Resource Strain: Not on file  Food Insecurity: Not on file  Transportation Needs: Not on file  Physical Activity: Not on file  Stress: Not on file  Social Connections: Not on file  Intimate Partner Violence: Not on file    Family History  Problem Relation Age of Onset  . Breast cancer Neg Hx   . Kidney cancer Neg Hx   . Bladder Cancer Neg Hx   . Prostate cancer Neg Hx      Current Outpatient Medications:  .  aspirin EC 81 MG tablet, Take 81 mg by mouth daily., Disp: , Rfl:  .  atenolol (TENORMIN) 50 MG tablet, Take 50 mg by mouth 2 (two) times daily., Disp: , Rfl:  .  carboxymethylcellulose (REFRESH PLUS) 0.5 % SOLN, Apply to eye., Disp: , Rfl:  .  celecoxib (CELEBREX) 200 MG capsule, Take 200 mg by mouth daily., Disp: , Rfl:  .  cephALEXin (KEFLEX) 500 MG capsule, Take 500 mg by mouth 2 (two) times daily., Disp: , Rfl:  .  cetirizine (ZYRTEC) 10 MG tablet, Take by mouth., Disp: , Rfl:  .  gabapentin (NEURONTIN) 400  MG capsule, Take 400 mg by mouth 3 (three) times daily., Disp: , Rfl:  .  Hydrocortisone Acetate (VAGISIL) 1 % CREA, Apply 1 application topically 3 (three) times daily as needed., Disp: 15 g, Rfl: 0 .  lidocaine-prilocaine (EMLA) cream, Apply small amount of cream over port site 1 1/2 hours before each treatment, place saran wrap over cream to protect clothin, Disp: 30 g, Rfl: 3 .  LORazepam (ATIVAN) 0.5 MG tablet, Take 1 tablet (0.5 mg total) by mouth every 6 (six) hours as needed (Nausea or vomiting)., Disp: 30 tablet, Rfl: 0 .  losartan (COZAAR) 100 MG tablet, Take 100 mg by mouth daily., Disp: , Rfl:  .  Multiple Vitamin (MULTI-VITAMINS) TABS, Take 1 tablet by mouth daily., Disp: , Rfl:  .  ondansetron (ZOFRAN) 8 MG tablet, Take 1 tablet (8 mg total) by mouth 2 (two) times daily as needed (Nausea or vomiting)., Disp: 30 tablet, Rfl: 1 .  Pancrelipase, Lip-Prot-Amyl, 40000-126000 units CPEP, Take 1 Dose by mouth in the morning, at noon, in  the evening, and at bedtime., Disp: , Rfl:  .  pantoprazole (PROTONIX) 40 MG tablet, Take 40 mg by mouth daily., Disp: , Rfl:  .  prochlorperazine (COMPAZINE) 10 MG tablet, Take 1 tablet (10 mg total) by mouth every 6 (six) hours as needed (Nausea or vomiting)., Disp: 30 tablet, Rfl: 1 .  traZODone (DESYREL) 100 MG tablet, Take 100 mg by mouth at bedtime., Disp: , Rfl:  .  amLODipine (NORVASC) 2.5 MG tablet, Take 2.5 mg by mouth daily. (Patient not taking: No sig reported), Disp: , Rfl:  .  clorazepate (TRANXENE) 3.75 MG tablet, Take 3.75 mg by mouth daily as needed. (Patient not taking: No sig reported), Disp: , Rfl:  .  cyclobenzaprine (FLEXERIL) 10 MG tablet, TAKE 1 TABLET (10 MG TOTAL) BY MOUTH 3 (THREE) TIMES DAILY AS NEEDED FOR MUSCLE SPASMS. (Patient not taking: No sig reported), Disp: , Rfl:  No current facility-administered medications for this visit.  Facility-Administered Medications Ordered in Other Visits:  .  0.9 %  sodium chloride infusion, ,  Intravenous, Once, Graciano Batson E, NP .  alteplase (CATHFLO ACTIVASE) injection 2 mg, 2 mg, Intracatheter, Once PRN, Faythe Casa E, NP .  heparin lock flush 100 unit/mL, 500 Units, Intracatheter, Once PRN, Sindy Guadeloupe, MD .  heparin lock flush 100 unit/mL, 250 Units, Intracatheter, Once PRN, Faythe Casa E, NP .  heparin lock flush 100 unit/mL, 500 Units, Intravenous, Once, Sindy Guadeloupe, MD .  sodium chloride flush (NS) 0.9 % injection 10 mL, 10 mL, Intravenous, PRN, Sindy Guadeloupe, MD, 10 mL at 06/06/20 1039 .  sodium chloride flush (NS) 0.9 % injection 10 mL, 10 mL, Intracatheter, PRN, Sindy Guadeloupe, MD .  sodium chloride flush (NS) 0.9 % injection 10 mL, 10 mL, Intracatheter, PRN, Faythe Casa E, NP, 10 mL at 06/20/20 1030 .  sodium chloride flush (NS) 0.9 % injection 10 mL, 10 mL, Intravenous, PRN, Faythe Casa E, NP, 10 mL at 08/12/20 1335 .  sodium chloride flush (NS) 0.9 % injection 3 mL, 3 mL, Intracatheter, PRN, Jacquelin Hawking, NP  Physical exam:  Vitals:   08/23/20 1030  BP: (!) 130/52  Pulse: 62  Resp: 12  Temp: 98 F (36.7 C)  TempSrc: Tympanic  Weight: 111 lb 14.4 oz (50.8 kg)   Physical Exam Constitutional:      General: She is not in acute distress.    Comments: Appears somewhat frail and fatigued  Cardiovascular:     Rate and Rhythm: Normal rate and regular rhythm.     Heart sounds: Normal heart sounds.  Pulmonary:     Effort: Pulmonary effort is normal.     Breath sounds: Normal breath sounds.  Abdominal:     General: Bowel sounds are normal.     Palpations: Abdomen is soft.  Skin:    General: Skin is warm and dry.  Neurological:     Mental Status: She is alert and oriented to person, place, and time.      CMP Latest Ref Rng & Units 08/23/2020  Glucose 70 - 99 mg/dL 121(H)  BUN 8 - 23 mg/dL 14  Creatinine 0.44 - 1.00 mg/dL 0.89  Sodium 135 - 145 mmol/L 138  Potassium 3.5 - 5.1 mmol/L 4.3  Chloride 98 - 111 mmol/L 103  CO2 22 -  32 mmol/L 25  Calcium 8.9 - 10.3 mg/dL 8.9  Total Protein 6.5 - 8.1 g/dL 6.7  Total Bilirubin 0.3 - 1.2 mg/dL 0.7  Alkaline  Phos 38 - 126 U/L 66  AST 15 - 41 U/L 25  ALT 0 - 44 U/L 12   CBC Latest Ref Rng & Units 08/23/2020  WBC 4.0 - 10.5 K/uL 7.2  Hemoglobin 12.0 - 15.0 g/dL 10.1(L)  Hematocrit 36.0 - 46.0 % 29.9(L)  Platelets 150 - 400 K/uL 399     Assessment and plan- Patient is a 80 y.o. female withpancreatic adenocarcinoma stage IBT2 N0 M0 s/p Whipple surgery. She is here for on treatment assessment prior to cycle 8-day 1 of gemcitabine chemotherapy  Counts okay to proceed with cycle 8-day 1 of gemcitabine chemotherapy today.  She will return to clinic in 1 week for cycle 8-day 8 gemcitabine and in 3 weeks for labs, Dr. Janese Banks and cycle 9-day 1 gemcitabine.  Plan is to complete followed by repeat scans.  She had recent CT chest abdomen and pelvis with contrast in April 2022 at [facility] which did not show any evidence of recurrence.  Plan to repeat scans in July.    Given that she had a positive uncinate margin at the time of surgery ideally she would benefit from concurrent chemoradiation following 6 months of chemotherapy.  However patient is feeling fatigued with this present regimen and also struggling with on and off nausea.  She is not keen on pursuing chemoradiation at this time.  Chemo induced nausea: Stable on Zofran.  UTI-recently treated with ciprofloxacin. repeat urinalysis was negative.  Vaginal dryness and irritation-can trial Replens. This is over-the-counter.  GERD-continue pantoprazole 40 mg daily.  Can add an H2 blocker at bedtime.  Recommend Pepcid 40 mg at bedtime.  Greater than 50% was spent in counseling and coordination of care with this patient including but not limited to discussion of the relevant topics above (See A&P) including, but not limited to diagnosis and management of acute and chronic medical conditions.   Faythe Casa, NP 08/23/2020 4:56  PM  Visit Diagnosis 1. Dysuria   2. Malignant neoplasm of head of pancreas ([place])   3. Vaginal dryness

## 2020-08-23 NOTE — Progress Notes (Signed)
Patient here for pre treatment check she reports that she does not feel like her UTI is completely resolved.

## 2020-08-23 NOTE — Patient Instructions (Signed)
[facility]  Discharge Instructions: Thank you for choosing [facility] to provide your oncology and hematology care.  If you have a lab appointment with the [facility], please go directly to the [facility] and check in at the registration area.  Wear comfortable clothing and clothing appropriate for easy access to any Portacath or PICC line.   We strive to give you quality time with your provider. You may need to reschedule your appointment if you arrive late (15 or more minutes).  Arriving late affects you and other patients whose appointments are after yours.  Also, if you miss three or more appointments without notifying the office, you may be dismissed from the clinic at the provider's discretion.      For prescription refill requests, have your pharmacy contact our office and allow 72 hours for refills to be completed.    Today you received the following chemotherapy and/or immunotherapy agents Gemzar      To help prevent nausea and vomiting after your treatment, we encourage you to take your nausea medication as directed.  BELOW ARE SYMPTOMS THAT SHOULD BE REPORTED IMMEDIATELY: . *FEVER GREATER THAN 100.4 F (38 C) OR HIGHER . *CHILLS OR SWEATING . *NAUSEA AND VOMITING THAT IS NOT CONTROLLED WITH YOUR NAUSEA MEDICATION . *UNUSUAL SHORTNESS OF BREATH . *UNUSUAL BRUISING OR BLEEDING . *URINARY PROBLEMS (pain or burning when urinating, or frequent urination) . *BOWEL PROBLEMS (unusual diarrhea, constipation, pain near the anus) . TENDERNESS IN MOUTH AND THROAT WITH OR WITHOUT PRESENCE OF ULCERS (sore throat, sores in mouth, or a toothache) . UNUSUAL RASH, SWELLING OR PAIN  . UNUSUAL VAGINAL DISCHARGE OR ITCHING   Items with * indicate a potential emergency and should be followed up as soon as possible or go to the Emergency Department if any problems should occur.  Please show the CHEMOTHERAPY ALERT CARD or IMMUNOTHERAPY ALERT  CARD at check-in to the Emergency Department and triage nurse.  Should you have questions after your visit or need to cancel or reschedule your appointment, please contact [facility]  [phone] and follow the prompts.  Office hours are 8:00 a.m. to 4:30 p.m. Monday - Friday. Please note that voicemails left after 4:00 p.m. may not be returned until the following business day.  We are closed weekends and major holidays. You have access to a nurse at all times for urgent questions. Please call the main number to the clinic [phone] and follow the prompts.  For any non-urgent questions, you may also contact your provider using MyChart. We now offer e-Visits for anyone 79 and older to request care online for non-urgent symptoms. For details visit [email or web address].   Also download the MyChart app! Go to the app store, search "MyChart", open the app, select [facility], and log in with your MyChart username and password.  Due to Covid, a mask is required upon entering the hospital/clinic. If you do not have a mask, one will be given to you upon arrival. For doctor visits, patients may have 1 support person aged 79 or older with them. For treatment visits, patients cannot have anyone with them due to current Covid guidelines and our immunocompromised population. Gemcitabine injection What is this medicine? GEMCITABINE (jem SYE ta been) is a chemotherapy drug. This medicine is used to treat many types of cancer like breast cancer, lung cancer, pancreatic cancer, and ovarian cancer. This medicine may be used for other purposes; ask your health care  provider or pharmacist if you have questions. COMMON BRAND NAME(S): Gemzar, Infugem What should I tell my health care provider before I take this medicine? They need to know if you have any of these conditions:  blood disorders  infection  kidney disease  liver disease  lung or breathing disease, like  asthma  recent or ongoing radiation therapy  an unusual or allergic reaction to gemcitabine, other chemotherapy, other medicines, foods, dyes, or preservatives  pregnant or trying to get pregnant  breast-feeding How should I use this medicine? This drug is given as an infusion into a vein. It is administered in a hospital or clinic by a specially trained health care professional. Talk to your pediatrician regarding the use of this medicine in children. Special care may be needed. Overdosage: If you think you have taken too much of this medicine contact a poison control center or emergency room at once. NOTE: This medicine is only for you. Do not share this medicine with others. What if I miss a dose? It is important not to miss your dose. Call your doctor or health care professional if you are unable to keep an appointment. What may interact with this medicine?  medicines to increase blood counts like filgrastim, pegfilgrastim, sargramostim  some other chemotherapy drugs like cisplatin  vaccines Talk to your doctor or health care professional before taking any of these medicines:  acetaminophen  aspirin  ibuprofen  ketoprofen  naproxen This list may not describe all possible interactions. Give your health care provider a list of all the medicines, herbs, non-prescription drugs, or dietary supplements you use. Also tell them if you smoke, drink alcohol, or use illegal drugs. Some items may interact with your medicine. What should I watch for while using this medicine? Visit your doctor for checks on your progress. This drug may make you feel generally unwell. This is not uncommon, as chemotherapy can affect healthy cells as well as cancer cells. Report any side effects. Continue your course of treatment even though you feel ill unless your doctor tells you to stop. In some cases, you may be given additional medicines to help with side effects. Follow all directions for their  use. Call your doctor or health care professional for advice if you get a fever, chills or sore throat, or other symptoms of a cold or flu. Do not treat yourself. This drug decreases your body's ability to fight infections. Try to avoid being around people who are sick. This medicine may increase your risk to bruise or bleed. Call your doctor or health care professional if you notice any unusual bleeding. Be careful brushing and flossing your teeth or using a toothpick because you may get an infection or bleed more easily. If you have any dental work done, tell your dentist you are receiving this medicine. Avoid taking products that contain aspirin, acetaminophen, ibuprofen, naproxen, or ketoprofen unless instructed by your doctor. These medicines may hide a fever. Do not become pregnant while taking this medicine or for 6 months after stopping it. Women should inform their doctor if they wish to become pregnant or think they might be pregnant. Men should not father a child while taking this medicine and for 3 months after stopping it. There is a potential for serious side effects to an unborn child. Talk to your health care professional or pharmacist for more information. Do not breast-feed an infant while taking this medicine or for at least 1 week after stopping it. Men should inform their doctors  if they wish to father a child. This medicine may lower sperm counts. Talk with your doctor or health care professional if you are concerned about your fertility. What side effects may I notice from receiving this medicine? Side effects that you should report to your doctor or health care professional as soon as possible:  allergic reactions like skin rash, itching or hives, swelling of the face, lips, or tongue  breathing problems  pain, redness, or irritation at site where injected  signs and symptoms of a dangerous change in heartbeat or heart rhythm like chest pain; dizziness; fast or irregular  heartbeat; palpitations; feeling faint or lightheaded, falls; breathing problems  signs of decreased platelets or bleeding - bruising, pinpoint red spots on the skin, black, tarry stools, blood in the urine  signs of decreased red blood cells - unusually weak or tired, feeling faint or lightheaded, falls  signs of infection - fever or chills, cough, sore throat, pain or difficulty passing urine  signs and symptoms of kidney injury like trouble passing urine or change in the amount of urine  signs and symptoms of liver injury like dark yellow or brown urine; general ill feeling or flu-like symptoms; light-colored stools; loss of appetite; nausea; right upper belly pain; unusually weak or tired; yellowing of the eyes or skin  swelling of ankles, feet, hands Side effects that usually do not require medical attention (report to your doctor or health care professional if they continue or are bothersome):  constipation  diarrhea  hair loss  loss of appetite  nausea  rash  vomiting This list may not describe all possible side effects. Call your doctor for medical advice about side effects. You may report side effects to FDA at 1-800-FDA-1088. Where should I keep my medicine? This drug is given in a hospital or clinic and will not be stored at home. NOTE: This sheet is a summary. It may not cover all possible information. If you have questions about this medicine, talk to your doctor, pharmacist, or health care provider.  2021 Elsevier/Gold Standard (2017-06-02 18:06:11)

## 2020-08-26 ENCOUNTER — Telehealth: Payer: Self-pay | Admitting: *Deleted

## 2020-08-26 NOTE — Telephone Encounter (Signed)
Patient called requesting that Lorretta Harp, NP call her back.SHe states she was told to get Replens from pharmacy, but there are many options and she does not know which to get.  She is also complaining of itching down there now also and is asking if she has yeast and what to do for it. Lastly, she states she was told to take a medicine for the burping after evening meals and she does not know when she is to take it (time of day). Please return her call

## 2020-08-29 ENCOUNTER — Encounter: Payer: Self-pay | Admitting: Oncology

## 2020-08-30 ENCOUNTER — Inpatient Hospital Stay: Payer: Medicare Other

## 2020-08-30 ENCOUNTER — Other Ambulatory Visit: Payer: Self-pay

## 2020-08-30 VITALS — BP 166/69 | HR 62 | Temp 96.2°F | Resp 16

## 2020-08-30 DIAGNOSIS — C250 Malignant neoplasm of head of pancreas: Secondary | ICD-10-CM

## 2020-08-30 DIAGNOSIS — Z5111 Encounter for antineoplastic chemotherapy: Secondary | ICD-10-CM | POA: Diagnosis not present

## 2020-08-30 LAB — CBC WITH DIFFERENTIAL/PLATELET
Abs Immature Granulocytes: 0.01 10*3/uL (ref 0.00–0.07)
Basophils Absolute: 0.1 10*3/uL (ref 0.0–0.1)
Basophils Relative: 2 %
Eosinophils Absolute: 0.1 10*3/uL (ref 0.0–0.5)
Eosinophils Relative: 2 %
HCT: 30.2 % — ABNORMAL LOW (ref 36.0–46.0)
Hemoglobin: 10.2 g/dL — ABNORMAL LOW (ref 12.0–15.0)
Immature Granulocytes: 0 %
Lymphocytes Relative: 19 %
Lymphs Abs: 0.7 10*3/uL (ref 0.7–4.0)
MCH: 33.6 pg (ref 26.0–34.0)
MCHC: 33.8 g/dL (ref 30.0–36.0)
MCV: 99.3 fL (ref 80.0–100.0)
Monocytes Absolute: 0.4 10*3/uL (ref 0.1–1.0)
Monocytes Relative: 9 %
Neutro Abs: 2.6 10*3/uL (ref 1.7–7.7)
Neutrophils Relative %: 68 %
Platelets: 766 10*3/uL — ABNORMAL HIGH (ref 150–400)
RBC: 3.04 MIL/uL — ABNORMAL LOW (ref 3.87–5.11)
RDW: 15.3 % (ref 11.5–15.5)
WBC: 3.9 10*3/uL — ABNORMAL LOW (ref 4.0–10.5)
nRBC: 0 % (ref 0.0–0.2)

## 2020-08-30 LAB — COMPREHENSIVE METABOLIC PANEL
ALT: 14 U/L (ref 0–44)
AST: 28 U/L (ref 15–41)
Albumin: 3.7 g/dL (ref 3.5–5.0)
Alkaline Phosphatase: 66 U/L (ref 38–126)
Anion gap: 9 (ref 5–15)
BUN: 13 mg/dL (ref 8–23)
CO2: 27 mmol/L (ref 22–32)
Calcium: 8.9 mg/dL (ref 8.9–10.3)
Chloride: 101 mmol/L (ref 98–111)
Creatinine, Ser: 0.91 mg/dL (ref 0.44–1.00)
GFR, Estimated: >60 mL/min (ref >60)
Glucose, Bld: 115 mg/dL — ABNORMAL HIGH (ref 70–99)
Potassium: 4.3 mmol/L (ref 3.5–5.1)
Sodium: 137 mmol/L (ref 135–145)
Total Bilirubin: 0.5 mg/dL (ref 0.3–1.2)
Total Protein: 6.5 g/dL (ref 6.5–8.1)

## 2020-08-30 MED ORDER — HEPARIN SOD (PORK) LOCK FLUSH 100 UNIT/ML IV SOLN
INTRAVENOUS | Status: AC
  Filled 2020-08-30: qty 5

## 2020-08-30 MED ORDER — HEPARIN SOD (PORK) LOCK FLUSH 100 UNIT/ML IV SOLN
500.0000 [IU] | Freq: Once | INTRAVENOUS | Status: AC | PRN
  Administered 2020-08-30: 500 [IU]
  Filled 2020-08-30: qty 5

## 2020-08-30 MED ORDER — SODIUM CHLORIDE 0.9% FLUSH
10.0000 mL | INTRAVENOUS | Status: DC | PRN
  Filled 2020-08-30: qty 10

## 2020-08-30 MED ORDER — PROCHLORPERAZINE MALEATE 10 MG PO TABS
10.0000 mg | ORAL_TABLET | Freq: Once | ORAL | Status: AC
Start: 2020-08-30 — End: 2020-08-30
  Administered 2020-08-30: 10 mg via ORAL
  Filled 2020-08-30: qty 1

## 2020-08-30 MED ORDER — SODIUM CHLORIDE 0.9 % IV SOLN
800.0000 mg/m2 | Freq: Once | INTRAVENOUS | Status: AC
  Administered 2020-08-30: 1254 mg via INTRAVENOUS
  Filled 2020-08-30: qty 26.3

## 2020-08-30 MED ORDER — SODIUM CHLORIDE 0.9 % IV SOLN
Freq: Once | INTRAVENOUS | Status: AC
  Filled 2020-08-30: qty 250

## 2020-08-30 NOTE — Patient Instructions (Signed)
[facility]  Discharge Instructions: Thank you for choosing [facility] to provide your oncology and hematology care.  If you have a lab appointment with the Cancer Center, please go directly to the Cancer Center and check in at the registration area.  Wear comfortable clothing and clothing appropriate for easy access to any Portacath or PICC line.   We strive to give you quality time with your provider. You may need to reschedule your appointment if you arrive late (15 or more minutes).  Arriving late affects you and other patients whose appointments are after yours.  Also, if you miss three or more appointments without notifying the office, you may be dismissed from the clinic at the provider's discretion.      For prescription refill requests, have your pharmacy contact our office and allow 72 hours for refills to be completed.    Today you received the following chemotherapy and/or immunotherapy agents - gemcitabine      To help prevent nausea and vomiting after your treatment, we encourage you to take your nausea medication as directed.  BELOW ARE SYMPTOMS THAT SHOULD BE REPORTED IMMEDIATELY: *FEVER GREATER THAN 100.4 F (38 C) OR HIGHER *CHILLS OR SWEATING *NAUSEA AND VOMITING THAT IS NOT CONTROLLED WITH YOUR NAUSEA MEDICATION *UNUSUAL SHORTNESS OF BREATH *UNUSUAL BRUISING OR BLEEDING *URINARY PROBLEMS (pain or burning when urinating, or frequent urination) *BOWEL PROBLEMS (unusual diarrhea, constipation, pain near the anus) TENDERNESS IN MOUTH AND THROAT WITH OR WITHOUT PRESENCE OF ULCERS (sore throat, sores in mouth, or a toothache) UNUSUAL RASH, SWELLING OR PAIN  UNUSUAL VAGINAL DISCHARGE OR ITCHING   Items with * indicate a potential emergency and should be followed up as soon as possible or go to the Emergency Department if any problems should occur.  Please show the CHEMOTHERAPY ALERT CARD or IMMUNOTHERAPY ALERT CARD at  check-in to the Emergency Department and triage nurse.  Should you have questions after your visit or need to cancel or reschedule your appointment, please contact [facility]  [phone] and follow the prompts.  Office hours are 8:00 a.m. to 4:30 p.m. Monday - Friday. Please note that voicemails left after 4:00 p.m. may not be returned until the following business day.  We are closed weekends and major holidays. You have access to a nurse at all times for urgent questions. Please call the main number to the clinic [phone] and follow the prompts.  For any non-urgent questions, you may also contact your provider using MyChart. We now offer e-Visits for anyone 18 and older to request care online for non-urgent symptoms. For details visit [email or web address].   Also download the MyChart app! Go to the app store, search "MyChart", open the app, select [facility], and log in with your MyChart username and password.  Due to Covid, a mask is required upon entering the hospital/clinic. If you do not have a mask, one will be given to you upon arrival. For doctor visits, patients may have 1 support person aged 18 or older with them. For treatment visits, patients cannot have anyone with them due to current Covid guidelines and our immunocompromised population.   Gemcitabine injection What is this medication? GEMCITABINE (jem SYE ta been) is a chemotherapy drug. This medicine is used to treat many types of cancer like breast cancer, lung cancer, pancreatic cancer, and ovarian cancer. This medicine may be used for other purposes; ask your health care provider or pharmacist if you have questions.   COMMON BRAND NAME(S): Gemzar, Infugem What should I tell my care team before I take this medication? They need to know if you have any of these conditions: blood disorders infection kidney disease liver disease lung or breathing disease, like asthma recent or ongoing  radiation therapy an unusual or allergic reaction to gemcitabine, other chemotherapy, other medicines, foods, dyes, or preservatives pregnant or trying to get pregnant breast-feeding How should I use this medication? This drug is given as an infusion into a vein. It is administered in a hospital or clinic by a specially trained health care professional. Talk to your pediatrician regarding the use of this medicine in children. Special care may be needed. Overdosage: If you think you have taken too much of this medicine contact a poison control center or emergency room at once. NOTE: This medicine is only for you. Do not share this medicine with others. What if I miss a dose? It is important not to miss your dose. Call your doctor or health care professional if you are unable to keep an appointment. What may interact with this medication? medicines to increase blood counts like filgrastim, pegfilgrastim, sargramostim some other chemotherapy drugs like cisplatin vaccines Talk to your doctor or health care professional before taking any of these medicines: acetaminophen aspirin ibuprofen ketoprofen naproxen This list may not describe all possible interactions. Give your health care provider a list of all the medicines, herbs, non-prescription drugs, or dietary supplements you use. Also tell them if you smoke, drink alcohol, or use illegal drugs. Some items may interact with your medicine. What should I watch for while using this medication? Visit your doctor for checks on your progress. This drug may make you feel generally unwell. This is not uncommon, as chemotherapy can affect healthy cells as well as cancer cells. Report any side effects. Continue your course of treatment even though you feel ill unless your doctor tells you to stop. In some cases, you may be given additional medicines to help with side effects. Follow all directions for their use. Call your doctor or health care  professional for advice if you get a fever, chills or sore throat, or other symptoms of a cold or flu. Do not treat yourself. This drug decreases your body's ability to fight infections. Try to avoid being around people who are sick. This medicine may increase your risk to bruise or bleed. Call your doctor or health care professional if you notice any unusual bleeding. Be careful brushing and flossing your teeth or using a toothpick because you may get an infection or bleed more easily. If you have any dental work done, tell your dentist you are receiving this medicine. Avoid taking products that contain aspirin, acetaminophen, ibuprofen, naproxen, or ketoprofen unless instructed by your doctor. These medicines may hide a fever. Do not become pregnant while taking this medicine or for 6 months after stopping it. Women should inform their doctor if they wish to become pregnant or think they might be pregnant. Men should not father a child while taking this medicine and for 3 months after stopping it. There is a potential for serious side effects to an unborn child. Talk to your health care professional or pharmacist for more information. Do not breast-feed an infant while taking this medicine or for at least 1 week after stopping it. Men should inform their doctors if they wish to father a child. This medicine may lower sperm counts. Talk with your doctor or health care professional if you are concerned   about your fertility. What side effects may I notice from receiving this medication? Side effects that you should report to your doctor or health care professional as soon as possible: allergic reactions like skin rash, itching or hives, swelling of the face, lips, or tongue breathing problems pain, redness, or irritation at site where injected signs and symptoms of a dangerous change in heartbeat or heart rhythm like chest pain; dizziness; fast or irregular heartbeat; palpitations; feeling faint or  lightheaded, falls; breathing problems signs of decreased platelets or bleeding - bruising, pinpoint red spots on the skin, black, tarry stools, blood in the urine signs of decreased red blood cells - unusually weak or tired, feeling faint or lightheaded, falls signs of infection - fever or chills, cough, sore throat, pain or difficulty passing urine signs and symptoms of kidney injury like trouble passing urine or change in the amount of urine signs and symptoms of liver injury like dark yellow or brown urine; general ill feeling or flu-like symptoms; light-colored stools; loss of appetite; nausea; right upper belly pain; unusually weak or tired; yellowing of the eyes or skin swelling of ankles, feet, hands Side effects that usually do not require medical attention (report to your doctor or health care professional if they continue or are bothersome): constipation diarrhea hair loss loss of appetite nausea rash vomiting This list may not describe all possible side effects. Call your doctor for medical advice about side effects. You may report side effects to FDA at 1-800-FDA-1088. Where should I keep my medication? This drug is given in a hospital or clinic and will not be stored at home. NOTE: This sheet is a summary. It may not cover all possible information. If you have questions about this medicine, talk to your doctor, pharmacist, or health care provider.  2022 Elsevier/Gold Standard (2017-06-02 18:06:11)  

## 2020-09-02 ENCOUNTER — Inpatient Hospital Stay: Payer: Medicare Other

## 2020-09-02 ENCOUNTER — Encounter: Payer: Self-pay | Admitting: Hospice and Palliative Medicine

## 2020-09-02 ENCOUNTER — Telehealth: Payer: Self-pay | Admitting: *Deleted

## 2020-09-02 ENCOUNTER — Telehealth: Payer: Self-pay | Admitting: Oncology

## 2020-09-02 ENCOUNTER — Other Ambulatory Visit: Payer: Self-pay | Admitting: *Deleted

## 2020-09-02 ENCOUNTER — Inpatient Hospital Stay (HOSPITAL_BASED_OUTPATIENT_CLINIC_OR_DEPARTMENT_OTHER): Payer: Medicare Other | Admitting: Hospice and Palliative Medicine

## 2020-09-02 VITALS — BP 143/44 | HR 60 | Temp 97.0°F | Resp 16

## 2020-09-02 DIAGNOSIS — C250 Malignant neoplasm of head of pancreas: Secondary | ICD-10-CM | POA: Diagnosis not present

## 2020-09-02 DIAGNOSIS — E860 Dehydration: Secondary | ICD-10-CM | POA: Diagnosis not present

## 2020-09-02 DIAGNOSIS — R531 Weakness: Secondary | ICD-10-CM

## 2020-09-02 DIAGNOSIS — R110 Nausea: Secondary | ICD-10-CM

## 2020-09-02 DIAGNOSIS — Z5111 Encounter for antineoplastic chemotherapy: Secondary | ICD-10-CM | POA: Diagnosis not present

## 2020-09-02 LAB — CBC WITH DIFFERENTIAL/PLATELET
Abs Immature Granulocytes: 0.01 10*3/uL (ref 0.00–0.07)
Basophils Absolute: 0 10*3/uL (ref 0.0–0.1)
Basophils Relative: 0 %
Eosinophils Absolute: 0.1 10*3/uL (ref 0.0–0.5)
Eosinophils Relative: 3 %
HCT: 26.5 % — ABNORMAL LOW (ref 36.0–46.0)
Hemoglobin: 9 g/dL — ABNORMAL LOW (ref 12.0–15.0)
Immature Granulocytes: 0 %
Lymphocytes Relative: 8 %
Lymphs Abs: 0.4 10*3/uL — ABNORMAL LOW (ref 0.7–4.0)
MCH: 33.6 pg (ref 26.0–34.0)
MCHC: 34 g/dL (ref 30.0–36.0)
MCV: 98.9 fL (ref 80.0–100.0)
Monocytes Absolute: 0.1 10*3/uL (ref 0.1–1.0)
Monocytes Relative: 2 %
Neutro Abs: 3.9 10*3/uL (ref 1.7–7.7)
Neutrophils Relative %: 87 %
Platelets: 483 10*3/uL — ABNORMAL HIGH (ref 150–400)
RBC: 2.68 MIL/uL — ABNORMAL LOW (ref 3.87–5.11)
RDW: 15.3 % (ref 11.5–15.5)
WBC: 4.5 10*3/uL (ref 4.0–10.5)
nRBC: 0 % (ref 0.0–0.2)

## 2020-09-02 LAB — COMPREHENSIVE METABOLIC PANEL
ALT: 14 U/L (ref 0–44)
AST: 25 U/L (ref 15–41)
Albumin: 3.3 g/dL — ABNORMAL LOW (ref 3.5–5.0)
Alkaline Phosphatase: 63 U/L (ref 38–126)
Anion gap: 8 (ref 5–15)
BUN: 20 mg/dL (ref 8–23)
CO2: 26 mmol/L (ref 22–32)
Calcium: 8.4 mg/dL — ABNORMAL LOW (ref 8.9–10.3)
Chloride: 98 mmol/L (ref 98–111)
Creatinine, Ser: 1.13 mg/dL — ABNORMAL HIGH (ref 0.44–1.00)
GFR, Estimated: 50 mL/min — ABNORMAL LOW (ref >60)
Glucose, Bld: 124 mg/dL — ABNORMAL HIGH (ref 70–99)
Potassium: 4.3 mmol/L (ref 3.5–5.1)
Sodium: 132 mmol/L — ABNORMAL LOW (ref 135–145)
Total Bilirubin: 0.7 mg/dL (ref 0.3–1.2)
Total Protein: 6.3 g/dL — ABNORMAL LOW (ref 6.5–8.1)

## 2020-09-02 MED ORDER — HEPARIN SOD (PORK) LOCK FLUSH 100 UNIT/ML IV SOLN
500.0000 [IU] | Freq: Once | INTRAVENOUS | Status: AC
Start: 2020-09-02 — End: 2020-09-02
  Administered 2020-09-02: 500 [IU] via INTRAVENOUS
  Filled 2020-09-02: qty 5

## 2020-09-02 MED ORDER — ONDANSETRON HCL 4 MG/2ML IJ SOLN
8.0000 mg | Freq: Once | INTRAMUSCULAR | Status: AC
  Administered 2020-09-02: 8 mg via INTRAVENOUS
  Filled 2020-09-02: qty 4

## 2020-09-02 MED ORDER — SODIUM CHLORIDE 0.9 % IV SOLN
Freq: Once | INTRAVENOUS | Status: AC
  Filled 2020-09-02: qty 250

## 2020-09-02 MED ORDER — SODIUM CHLORIDE 0.9 % IV SOLN: 8.0000 mg | Freq: Once | INTRAVENOUS | Status: DC

## 2020-09-02 MED ORDER — SODIUM CHLORIDE 0.9% FLUSH
10.0000 mL | INTRAVENOUS | Status: AC | PRN
  Administered 2020-09-02: 10 mL via INTRAVENOUS
  Filled 2020-09-02: qty 10

## 2020-09-02 NOTE — Telephone Encounter (Signed)
Patient called reporting that she almost passed out in church yesterday and had to lie down in the pew, EMS was called and wanted her to go to the ER, but she refused and so they told her she needed to be seen today in our office. She states she feels weak and needs IV fluids asa she had a few weeks ago and that she had treatment Friday.Please contact patient with appointment if in agreement

## 2020-09-02 NOTE — Progress Notes (Signed)
Received IVFs and nausea medication. VSS. No complaints at time of discharge.

## 2020-09-02 NOTE — Telephone Encounter (Signed)
Patients daughter called stating the patient passed out at church on Sunday and the ambulance came but she refused to go to the hospital.  She is requesting to come in for potential fluids post treatment.  Routing to clinical team for follow up.

## 2020-09-02 NOTE — Telephone Encounter (Signed)
Can one of you please see her in [facility]?

## 2020-09-02 NOTE — Telephone Encounter (Signed)
Contacted patient's daughter patient is on schedule for this after noon for labs/NP and fluids if needed.

## 2020-09-02 NOTE — Progress Notes (Signed)
Patient here for [facility] she feinted last Sunday in church , she refused to go to ER.

## 2020-09-02 NOTE — Progress Notes (Signed)
Symptom [facility]  [phone] [phone]  Patient Care Team: Emily Hire, MD as PCP - General (Internal Medicine)   Name of the patient: Emily [facility]  [id number]  [birth year]   Date of visit: 09/02/20  Reason for Consult: Ms. [facility] is a 80 year old woman with multiple medical problems including stage Ib pancreatic cancer status post [facility] he was on treatment with gemcitabine.  Patient presents to the [facility] on 09/02/2020 with complaint of near syncope yesterday while in church.  She reports that while in church, she started feeling faint/warm and then proceeded to lie down in the pew while EMS was called.  She did not lose consciousness.  However, she says that she has fainted in the past 2 other times while at church.  She denies any neurologic complaints. Denies recent fevers or illnesses. Denies any easy bleeding or bruising. Reports good appetite and denies weight loss.  Has chronic GERD.  Denies chest pain. Denies any nausea, vomiting, constipation, or diarrhea.  No current urinary frequency/burning.   Patient was recently treated for UTI.  She reports some vaginal itching but denies discharge.  She has tried OTC Monistat.  Patient offers no further specific complaints today.  PAST MEDICAL HISTORY: Past Medical History:  Diagnosis Date   Anxiety    Arthritis    Breast cancer ([facility]) 1993   RT MASTECTOMY   Heartburn    HTN (hypertension)    Pancreatic cancer ([facility])    Personal history of chemotherapy    S/P chemotherapy, time since greater than 12 weeks 1993   BREAST CA    PAST SURGICAL HISTORY:  Past Surgical History:  Procedure Laterality Date   ABDOMINAL HYSTERECTOMY     colon polyp removal     GALLBLADDER SURGERY     LAPAROSCOPIC OOPHERECTOMY  1978   MASTECTOMY Right 1993   BREAST CA   PANCREAS SURGERY N/A 01/04/2020   Per patient removed head of pancreas due to cancer   PORTA CATH INSERTION  N/A 04/04/2020   Procedure: PORTA CATH INSERTION;  Surgeon: Emily Huxley, MD;  Location: [facility];  Service: Cardiovascular;  Laterality: N/A;   removal of fallopian tubes  1989    HEMATOLOGY/ONCOLOGY HISTORY:  Oncology History  Malignant neoplasm of head of pancreas ([place])  03/21/2020 Initial Diagnosis   Malignant neoplasm of head of pancreas ([place])    03/21/2020 Cancer Staging   Staging form: Exocrine Pancreas, AJCC 8th Edition - Pathologic stage from 03/21/2020: Stage IB (pT2, pN0, cM0) - Signed by Emily Guadeloupe, MD on 03/21/2020    04/11/2020 -  Chemotherapy    Patient is on Treatment Plan: PANCREAS GEMCITABINE D1,8,15 Q28D X 1 CYCLE         ALLERGIES:  is allergic to hydromorphone hcl, azithromycin, codeine sulfate, doxycycline calcium, duloxetine, erythromycin ethylsuccinate, macrolides and ketolides, nitrofurantoin, and sulfa antibiotics.  MEDICATIONS:  Current Outpatient Medications  Medication Sig Dispense Refill   aspirin EC 81 MG tablet Take 81 mg by mouth daily.     atenolol (TENORMIN) 50 MG tablet Take 50 mg by mouth 2 (two) times daily.     carboxymethylcellulose (REFRESH PLUS) 0.5 % SOLN Apply to eye.     celecoxib (CELEBREX) 200 MG capsule Take 200 mg by mouth daily.     cephALEXin (KEFLEX) 500 MG capsule Take 500 mg by mouth 2 (two) times daily.     cetirizine (ZYRTEC) 10 MG tablet Take by mouth.  gabapentin (NEURONTIN) 400 MG capsule Take 400 mg by mouth 3 (three) times daily.     Hydrocortisone Acetate (VAGISIL) 1 % CREA Apply 1 application topically 3 (three) times daily as needed. 15 g 0   lidocaine-prilocaine (EMLA) cream Apply small amount of cream over port site 1 1/2 hours before each treatment, place saran wrap over cream to protect clothin 30 g 3   LORazepam (ATIVAN) 0.5 MG tablet Take 1 tablet (0.5 mg total) by mouth every 6 (six) hours as needed (Nausea or vomiting). 30 tablet 0   losartan (COZAAR) 100 MG tablet Take 100 mg by mouth  daily.     Multiple Vitamin (MULTI-VITAMINS) TABS Take 1 tablet by mouth daily.     ondansetron (ZOFRAN) 8 MG tablet Take 1 tablet (8 mg total) by mouth 2 (two) times daily as needed (Nausea or vomiting). 30 tablet 1   Pancrelipase, Lip-Prot-Amyl, 40000-126000 units CPEP Take 1 Dose by mouth in the morning, at noon, in the evening, and at bedtime.     pantoprazole (PROTONIX) 40 MG tablet Take 40 mg by mouth daily.     prochlorperazine (COMPAZINE) 10 MG tablet Take 1 tablet (10 mg total) by mouth every 6 (six) hours as needed (Nausea or vomiting). 30 tablet 1   traZODone (DESYREL) 100 MG tablet Take 100 mg by mouth at bedtime.     amLODipine (NORVASC) 2.5 MG tablet Take 2.5 mg by mouth daily. (Patient not taking: No sig reported)     clorazepate (TRANXENE) 3.75 MG tablet Take 3.75 mg by mouth daily as needed. (Patient not taking: No sig reported)     cyclobenzaprine (FLEXERIL) 10 MG tablet TAKE 1 TABLET (10 MG TOTAL) BY MOUTH 3 (THREE) TIMES DAILY AS NEEDED FOR MUSCLE SPASMS. (Patient not taking: No sig reported)     No current facility-administered medications for this visit.   Facility-Administered Medications Ordered in Other Visits  Medication Dose Route Frequency Provider Last Rate Last Admin   0.9 %  sodium chloride infusion   Intravenous Once Emily Hawking, NP       alteplase (CATHFLO ACTIVASE) injection 2 mg  2 mg Intracatheter Once PRN Emily Hawking, NP       heparin lock flush 100 unit/mL  500 Units Intracatheter Once PRN Emily Guadeloupe, MD       heparin lock flush 100 unit/mL  250 Units Intracatheter Once PRN Emily Hawking, NP       heparin lock flush 100 unit/mL  500 Units Intravenous Once Emily Guadeloupe, MD       sodium chloride flush (NS) 0.9 % injection 10 mL  10 mL Intravenous PRN Emily Guadeloupe, MD   10 mL at 06/06/20 1039   sodium chloride flush (NS) 0.9 % injection 10 mL  10 mL Intracatheter PRN Emily Guadeloupe, MD       sodium chloride flush (NS) 0.9 % injection 10  mL  10 mL Intracatheter PRN Emily Hawking, NP   10 mL at 06/20/20 1030   sodium chloride flush (NS) 0.9 % injection 10 mL  10 mL Intravenous PRN Emily Hawking, NP   10 mL at 08/12/20 1335   sodium chloride flush (NS) 0.9 % injection 10 mL  10 mL Intravenous PRN Arrie Zuercher, Kirt Boys, NP   10 mL at 09/02/20 1411   sodium chloride flush (NS) 0.9 % injection 3 mL  3 mL Intracatheter PRN Emily Hawking, NP  VITAL SIGNS: BP (!) 143/44   Pulse 60   Temp (!) 97 F (36.1 C) (Tympanic)   Resp 16   SpO2 99%  Filed Weights    Estimated body mass index is 19.59 kg/m as calculated from the following:   Height as of 06/06/20: 5\' 3"  (1.6 m).   Weight as of 08/30/20: 110 lb 9.6 oz (50.2 kg).  LABS: CBC:    Component Value Date/Time   WBC 4.5 09/02/2020 1401   HGB 9.0 (L) 09/02/2020 1401   HCT 26.5 (L) 09/02/2020 1401   PLT 483 (H) 09/02/2020 1401   MCV 98.9 09/02/2020 1401   NEUTROABS 3.9 09/02/2020 1401   LYMPHSABS 0.4 (L) 09/02/2020 1401   MONOABS 0.1 09/02/2020 1401   EOSABS 0.1 09/02/2020 1401   BASOSABS 0.0 09/02/2020 1401   Comprehensive Metabolic Panel:    Component Value Date/Time   NA 132 (L) 09/02/2020 1401   K 4.3 09/02/2020 1401   CL 98 09/02/2020 1401   CO2 26 09/02/2020 1401   BUN 20 09/02/2020 1401   CREATININE 1.13 (H) 09/02/2020 1401   GLUCOSE 124 (H) 09/02/2020 1401   CALCIUM 8.4 (L) 09/02/2020 1401   AST 25 09/02/2020 1401   ALT 14 09/02/2020 1401   ALKPHOS 63 09/02/2020 1401   BILITOT 0.7 09/02/2020 1401   PROT 6.3 (L) 09/02/2020 1401   ALBUMIN 3.3 (L) 09/02/2020 1401    RADIOGRAPHIC STUDIES: No results found.  PERFORMANCE STATUS (ECOG) : 2 - Symptomatic, <50% confined to bed  Review of Systems Unless otherwise noted, a complete review of systems is negative.  Physical Exam General: NAD Cardiovascular: regular rate and rhythm Pulmonary: clear ant fields Abdomen: soft, nontender, + bowel sounds GU: no suprapubic tenderness Extremities:  no edema, no joint deformities Skin: no rashes Neurological: Weakness but otherwise nonfocal  Assessment and Plan- Patient is a 80 y.o. female stage Ib pancreatic cancer on systemic treatment with gemcitabine who presents to [facility] today for evaluation of near syncope.  Near syncope -labs appear consistent with dehydration.  We will give 1 L normal saline today.  Encouraged oral fluids.  Discussed option for returning for IV fluids later in the week if needed.     Vaginal itching -continue Monistat.  Patient says that she was told we have a GYN NP.  We will see if Beckey Rutter has availability to see patient  Case and plan discussed with Dr. Janese Banks  Patient expressed understanding and was in agreement with this plan. She also understands that She can call clinic at any time with any questions, concerns, or complaints.   Thank you for allowing me to participate in the care of this very pleasant patient.   Time Total: 20 minutes  Visit consisted of counseling and education dealing with the complex and emotionally intense issues of symptom management and palliative care in the setting of serious and potentially life-threatening illness.Greater than 50%  of this time was spent counseling and coordinating care related to the above assessment and plan.  Signed by: Altha Harm, PhD, NP-C

## 2020-09-02 NOTE — Telephone Encounter (Signed)
This is a duplicate message, the patient also called regarding this and a note has been entered and responded to

## 2020-09-04 ENCOUNTER — Other Ambulatory Visit: Payer: Self-pay | Admitting: *Deleted

## 2020-09-04 DIAGNOSIS — C250 Malignant neoplasm of head of pancreas: Secondary | ICD-10-CM

## 2020-09-04 MED ORDER — LORAZEPAM 0.5 MG PO TABS: 0.5000 mg | ORAL_TABLET | Freq: Four times a day (QID) | ORAL | 1 refills | Status: DC | PRN

## 2020-09-05 ENCOUNTER — Telehealth: Payer: Self-pay | Admitting: *Deleted

## 2020-09-05 ENCOUNTER — Inpatient Hospital Stay: Payer: Medicare Other | Admitting: Nurse Practitioner

## 2020-09-05 ENCOUNTER — Inpatient Hospital Stay (HOSPITAL_BASED_OUTPATIENT_CLINIC_OR_DEPARTMENT_OTHER): Payer: Medicare Other | Admitting: Nurse Practitioner

## 2020-09-05 ENCOUNTER — Ambulatory Visit: Payer: Medicare Other

## 2020-09-05 VITALS — BP 181/57 | HR 70 | Temp 98.5°F | Resp 16

## 2020-09-05 DIAGNOSIS — Z5111 Encounter for antineoplastic chemotherapy: Secondary | ICD-10-CM | POA: Diagnosis not present

## 2020-09-05 DIAGNOSIS — C259 Malignant neoplasm of pancreas, unspecified: Secondary | ICD-10-CM

## 2020-09-05 DIAGNOSIS — R110 Nausea: Secondary | ICD-10-CM

## 2020-09-05 DIAGNOSIS — N952 Postmenopausal atrophic vaginitis: Secondary | ICD-10-CM | POA: Diagnosis not present

## 2020-09-05 MED ORDER — HEPARIN SOD (PORK) LOCK FLUSH 100 UNIT/ML IV SOLN
INTRAVENOUS | Status: AC
  Filled 2020-09-05: qty 5

## 2020-09-05 MED ORDER — HEPARIN SOD (PORK) LOCK FLUSH 100 UNIT/ML IV SOLN
500.0000 [IU] | Freq: Once | INTRAVENOUS | Status: AC | PRN
  Administered 2020-09-05: 500 [IU]
  Filled 2020-09-05: qty 5

## 2020-09-05 MED ORDER — SODIUM CHLORIDE 0.9 % IV SOLN
Freq: Once | INTRAVENOUS | Status: AC
  Filled 2020-09-05: qty 250

## 2020-09-05 MED ORDER — PROCHLORPERAZINE EDISYLATE 10 MG/2ML IJ SOLN
10.0000 mg | Freq: Once | INTRAMUSCULAR | Status: AC
  Administered 2020-09-05: 10 mg via INTRAVENOUS
  Filled 2020-09-05: qty 2

## 2020-09-05 MED ORDER — SODIUM CHLORIDE 0.9 % IV SOLN
Freq: Once | INTRAVENOUS | Status: DC
  Filled 2020-09-05: qty 250

## 2020-09-05 NOTE — Progress Notes (Signed)
Symptom Management [facility]  Telephone:([phone] [phone]  Patient Care Team: Baxter Hire, MD as PCP - General (Internal Medicine)   Name of the patient: Emily Harding  [id number]  1940-10-27   Date of visit: 09/05/20  Diagnosis- Pancreatic Cancer  Chief complaint/ Reason for visit- Vulvar Pain  Heme/Onc history:  Oncology History  Malignant neoplasm of head of pancreas ([place])  03/21/2020 Initial Diagnosis   Malignant neoplasm of head of pancreas ([place])    03/21/2020 Cancer Staging   Staging form: Exocrine Pancreas, AJCC 8th Edition - Pathologic stage from 03/21/2020: Stage IB (pT2, pN0, cM0) - Signed by Sindy Guadeloupe, MD on 03/21/2020    04/11/2020 -  Chemotherapy    Patient is on Treatment Plan: PANCREAS GEMCITABINE D1,8,15 Q28D X 1 CYCLE         Interval history-patient 80 year old female presents to symptom management clinic for complaints of vulvar/vaginal pain.  Symptoms come and go.  Symptoms present for several months.  No drainage, discharge, bleeding.  Was previously treated with Monistat without improvement in symptoms.  Picked up Replens but hasn't started it.  Describes pain as mild. Says her eye doctor told her she has dry eyes. Was scheduled for IV fluids today.  Review of systems- Review of Systems  Constitutional:  Positive for malaise/fatigue. Negative for chills, fever and weight loss.  Gastrointestinal:  Negative for abdominal pain, constipation and diarrhea.  Genitourinary:  Negative for dysuria, frequency, hematuria and urgency.  Musculoskeletal:  Negative for back pain, myalgias and neck pain.  Skin:  Negative for itching and rash.  Neurological:  Negative for loss of consciousness and weakness.  Psychiatric/Behavioral:  Negative for depression. The patient is nervous/anxious.      Allergies  Allergen Reactions   Hydromorphone Hcl Itching   Azithromycin    Codeine Sulfate Other (See Comments)    Doxycycline Calcium Other (See Comments)   Duloxetine Nausea Only   Erythromycin Ethylsuccinate Other (See Comments)   Macrolides And Ketolides Other (See Comments)   Nitrofurantoin     Other reaction(s): Unknown Other reaction(s): Abdominal Pain Other reaction(s): Unknown   Sulfa Antibiotics     Past Medical History:  Diagnosis Date   Anxiety    Arthritis    Breast cancer ([place]) 1993   RT MASTECTOMY   Heartburn    HTN (hypertension)    Pancreatic cancer ([place])    Personal history of chemotherapy    S/P chemotherapy, time since greater than 12 weeks 1993   BREAST CA    Past Surgical History:  Procedure Laterality Date   ABDOMINAL HYSTERECTOMY     colon polyp removal     GALLBLADDER SURGERY     LAPAROSCOPIC OOPHERECTOMY  1978   MASTECTOMY Right 1993   BREAST CA   PANCREAS SURGERY N/A 01/04/2020   Per patient removed head of pancreas due to cancer   PORTA CATH INSERTION N/A 04/04/2020   Procedure: PORTA CATH INSERTION;  Surgeon: Algernon Huxley, MD;  Location: [facility];  Service: Cardiovascular;  Laterality: N/A;   removal of fallopian tubes  1989    Social History   Socioeconomic History   Marital status: Widowed    Spouse name: Not on file   Number of children: Not on file   Years of education: Not on file   Highest education level: Not on file  Occupational History   Not on file  Tobacco Use   Smoking status: Never   Smokeless  tobacco: Never  Vaping Use   Vaping Use: Never used  Substance and Sexual Activity   Alcohol use: No   Drug use: No   Sexual activity: Not Currently  Other Topics Concern   Not on file  Social History Narrative   Not on file   Social Determinants of Health   Financial Resource Strain: Not on file  Food Insecurity: Not on file  Transportation Needs: Not on file  Physical Activity: Not on file  Stress: Not on file  Social Connections: Not on file  Intimate Partner Violence: Not on file    Family History   Problem Relation Age of Onset   Breast cancer Neg Hx    Kidney cancer Neg Hx    Bladder Cancer Neg Hx    Prostate cancer Neg Hx      Current Outpatient Medications:    aspirin EC 81 MG tablet, Take 81 mg by mouth daily., Disp: , Rfl:    atenolol (TENORMIN) 50 MG tablet, Take 50 mg by mouth 2 (two) times daily., Disp: , Rfl:    carboxymethylcellulose (REFRESH PLUS) 0.5 % SOLN, Apply to eye., Disp: , Rfl:    celecoxib (CELEBREX) 200 MG capsule, Take 200 mg by mouth daily., Disp: , Rfl:    cephALEXin (KEFLEX) 500 MG capsule, Take 500 mg by mouth 2 (two) times daily., Disp: , Rfl:    cetirizine (ZYRTEC) 10 MG tablet, Take by mouth., Disp: , Rfl:    clorazepate (TRANXENE) 3.75 MG tablet, Take 3.75 mg by mouth daily as needed., Disp: , Rfl:    gabapentin (NEURONTIN) 400 MG capsule, Take 400 mg by mouth 3 (three) times daily., Disp: , Rfl:    Hydrocortisone Acetate (VAGISIL) 1 % CREA, Apply 1 application topically 3 (three) times daily as needed., Disp: 15 g, Rfl: 0   lidocaine-prilocaine (EMLA) cream, Apply small amount of cream over port site 1 1/2 hours before each treatment, place saran wrap over cream to protect clothin, Disp: 30 g, Rfl: 3   LORazepam (ATIVAN) 0.5 MG tablet, Take 1 tablet (0.5 mg total) by mouth every 6 (six) hours as needed (Nausea or vomiting)., Disp: 30 tablet, Rfl: 1   losartan (COZAAR) 100 MG tablet, Take 100 mg by mouth daily., Disp: , Rfl:    Multiple Vitamin (MULTI-VITAMINS) TABS, Take 1 tablet by mouth daily., Disp: , Rfl:    ondansetron (ZOFRAN) 8 MG tablet, Take 1 tablet (8 mg total) by mouth 2 (two) times daily as needed (Nausea or vomiting)., Disp: 30 tablet, Rfl: 1   Pancrelipase, Lip-Prot-Amyl, 40000-126000 units CPEP, Take 1 Dose by mouth in the morning, at noon, in the evening, and at bedtime., Disp: , Rfl:    pantoprazole (PROTONIX) 40 MG tablet, Take 40 mg by mouth daily., Disp: , Rfl:    prochlorperazine (COMPAZINE) 10 MG tablet, Take 1 tablet (10 mg  total) by mouth every 6 (six) hours as needed (Nausea or vomiting)., Disp: 30 tablet, Rfl: 1   traZODone (DESYREL) 100 MG tablet, Take 100 mg by mouth at bedtime., Disp: , Rfl:    amLODipine (NORVASC) 2.5 MG tablet, Take 2.5 mg by mouth daily. (Patient not taking: Reported on 09/05/2020), Disp: , Rfl:    cyclobenzaprine (FLEXERIL) 10 MG tablet, TAKE 1 TABLET (10 MG TOTAL) BY MOUTH 3 (THREE) TIMES DAILY AS NEEDED FOR MUSCLE SPASMS. (Patient not taking: No sig reported), Disp: , Rfl:  No current facility-administered medications for this visit.  Facility-Administered Medications Ordered in Other Visits:  0.9 %  sodium chloride infusion, , Intravenous, Once, Burns, Anderson Malta E, NP   alteplase (CATHFLO ACTIVASE) injection 2 mg, 2 mg, Intracatheter, Once PRN, Faythe Casa E, NP   heparin lock flush 100 unit/mL, 500 Units, Intracatheter, Once PRN, Sindy Guadeloupe, MD   heparin lock flush 100 unit/mL, 250 Units, Intracatheter, Once PRN, Jacquelin Hawking, NP   heparin lock flush 100 unit/mL, 500 Units, Intravenous, Once, Sindy Guadeloupe, MD   sodium chloride flush (NS) 0.9 % injection 10 mL, 10 mL, Intravenous, PRN, Sindy Guadeloupe, MD, 10 mL at 06/06/20 1039   sodium chloride flush (NS) 0.9 % injection 10 mL, 10 mL, Intracatheter, PRN, Sindy Guadeloupe, MD   sodium chloride flush (NS) 0.9 % injection 10 mL, 10 mL, Intracatheter, PRN, Faythe Casa E, NP, 10 mL at 06/20/20 1030   sodium chloride flush (NS) 0.9 % injection 10 mL, 10 mL, Intravenous, PRN, Faythe Casa E, NP, 10 mL at 08/12/20 1335   sodium chloride flush (NS) 0.9 % injection 10 mL, 10 mL, Intravenous, PRN, Borders, Vonna Kotyk R, NP, 10 mL at 09/02/20 1411   sodium chloride flush (NS) 0.9 % injection 3 mL, 3 mL, Intracatheter, PRN, Jacquelin Hawking, NP  Physical exam:  Vitals:   09/05/20 1400  BP: (!) 181/57  Pulse: 70  Resp: 16  Temp: 98.5 F (36.9 C)  TempSrc: Oral   Physical Exam Exam conducted with a chaperone present.   Constitutional:      Appearance: She is obese. She is not ill-appearing.  Genitourinary:    Pubic Area: No rash.      Labia:        Right: No rash, tenderness, lesion or injury.        Left: No rash, tenderness, lesion or injury.      Urethra: No urethral lesion.     Vagina: No vaginal discharge, tenderness, bleeding or lesions.     Comments: Atrophic vaginal tissue. Cervix, uterus, adnexa surgically absent. Rectal deferred.  Lymphadenopathy:     Lower Body: No right inguinal adenopathy. No left inguinal adenopathy.  Neurological:     Mental Status: She is alert.  Psychiatric:        Mood and Affect: Mood is anxious.        Speech: Speech normal.        Behavior: Behavior normal.     CMP Latest Ref Rng & Units 09/02/2020  Glucose 70 - 99 mg/dL 124(H)  BUN 8 - 23 mg/dL 20  Creatinine 0.44 - 1.00 mg/dL 1.13(H)  Sodium 135 - 145 mmol/L 132(L)  Potassium 3.5 - 5.1 mmol/L 4.3  Chloride 98 - 111 mmol/L 98  CO2 22 - 32 mmol/L 26  Calcium 8.9 - 10.3 mg/dL 8.4(L)  Total Protein 6.5 - 8.1 g/dL 6.3(L)  Total Bilirubin 0.3 - 1.2 mg/dL 0.7  Alkaline Phos 38 - 126 U/L 63  AST 15 - 41 U/L 25  ALT 0 - 44 U/L 14   CBC Latest Ref Rng & Units 09/02/2020  WBC 4.0 - 10.5 K/uL 4.5  Hemoglobin 12.0 - 15.0 g/dL 9.0(L)  Hematocrit 36.0 - 46.0 % 26.5(L)  Platelets 150 - 400 K/uL 483(H)    No images are attached to the encounter.  No results found.  Assessment and plan- Patient is a 80 y.o. female with pancreatic cancer who presents for vaginal burning.   No evidence on infection on pelvic. Suspect vulvovaginal atrophy. Can try otc replens but would consider estradiol/premarin topical  estrogen if symptoms refractory. If contraindication to topical estrogens would consider prasterone, DHEA vaginal suppository. This may also help to reduce the frequency of UTIs if this is problematic.   Follow up as needed if symptoms do not improve or worsen in the interim.   Visit Diagnosis 1. Vaginal  atrophy     Patient expressed understanding and was in agreement with this plan. She also understands that She can call clinic at any time with any questions, concerns, or complaints.   I discussed the assessment and treatment plan with the patient. The patient was provided an opportunity to ask questions and all were answered. The patient agreed with the plan and demonstrated an understanding of the instructions.   The patient was advised to call back or seek an in-person evaluation if the symptoms worsen or if the condition fails to improve as anticipated.   I spent 35 minutes face-to-face visit time dedicated to the care of this patient on the date of this encounter to including pre-visit review of medical oncology notes, face-to-face time with the patient including pelvic exam and education, and post visit ordering of testing/documentation.   Thank you for allowing me to participate in the care of this very pleasant patient.   Beckey Rutter, DNP, AGNP-C [facility]  CC: Dr. Janese Banks

## 2020-09-05 NOTE — Telephone Encounter (Signed)
Patient called stating she was told she may need additional IV fluids when she was seen the other day and she would like to come in to get more IV fluids. Please advise

## 2020-09-05 NOTE — Progress Notes (Signed)
Appt cancelled. Disregard.

## 2020-09-05 NOTE — Telephone Encounter (Signed)
Please schedule her for 1L IVF. No need to be seen by smc again unless she has concerns

## 2020-09-05 NOTE — Patient Instructions (Signed)
[facility]  Discharge Instructions: Thank you for choosing [facility] to provide your oncology and hematology care.  If you have a lab appointment with the [facility], please go directly to the [facility] and check in at the registration area.  Wear comfortable clothing and clothing appropriate for easy access to any Portacath or PICC line.   We strive to give you quality time with your provider. You may need to reschedule your appointment if you arrive late (15 or more minutes).  Arriving late affects you and other patients whose appointments are after yours.  Also, if you miss three or more appointments without notifying the office, you may be dismissed from the clinic at the provider's discretion.      For prescription refill requests, have your pharmacy contact our office and allow 72 hours for refills to be completed.    Today you received the following chemotherapy and/or immunotherapy agents IVF and compazine      To help prevent nausea and vomiting after your treatment, we encourage you to take your nausea medication as directed.  BELOW ARE SYMPTOMS THAT SHOULD BE REPORTED IMMEDIATELY: *FEVER GREATER THAN 100.4 F (38 C) OR HIGHER *CHILLS OR SWEATING *NAUSEA AND VOMITING THAT IS NOT CONTROLLED WITH YOUR NAUSEA MEDICATION *UNUSUAL SHORTNESS OF BREATH *UNUSUAL BRUISING OR BLEEDING *URINARY PROBLEMS (pain or burning when urinating, or frequent urination) *BOWEL PROBLEMS (unusual diarrhea, constipation, pain near the anus) TENDERNESS IN MOUTH AND THROAT WITH OR WITHOUT PRESENCE OF ULCERS (sore throat, sores in mouth, or a toothache) UNUSUAL RASH, SWELLING OR PAIN  UNUSUAL VAGINAL DISCHARGE OR ITCHING   Items with * indicate a potential emergency and should be followed up as soon as possible or go to the Emergency Department if any problems should occur.  Please show the CHEMOTHERAPY ALERT CARD or IMMUNOTHERAPY ALERT CARD at  check-in to the Emergency Department and triage nurse.  Should you have questions after your visit or need to cancel or reschedule your appointment, please contact [facility]  [phone] and follow the prompts.  Office hours are 8:00 a.m. to 4:30 p.m. Monday - Friday. Please note that voicemails left after 4:00 p.m. may not be returned until the following business day.  We are closed weekends and major holidays. You have access to a nurse at all times for urgent questions. Please call the main number to the clinic [phone] and follow the prompts.  For any non-urgent questions, you may also contact your provider using MyChart. We now offer e-Visits for anyone 45 and older to request care online for non-urgent symptoms. For details visit [email or web address].   Also download the MyChart app! Go to the app store, search "MyChart", open the app, select [facility], and log in with your MyChart username and password.  Due to Covid, a mask is required upon entering the hospital/clinic. If you do not have a mask, one will be given to you upon arrival. For doctor visits, patients may have 1 support person aged 40 or older with them. For treatment visits, patients cannot have anyone with them due to current Covid guidelines and our immunocompromised population.   Prochlorperazine injection What is this medication? PROCHLORPERAZINE (proe klor PER a zeen) helps to control severe nausea and vomiting. This medicine is also used to treat schizophrenia. It can also helppatients who experience anxiety that is not due to psychological illness. This medicine may be used for other purposes; ask your health care  provider orpharmacist if you have questions. COMMON BRAND NAME(S): Compazine What should I tell my care team before I take this medication? They need to know if you have any of these conditions: blockage in your bowel brain tumor dementia diabetes difficulty  swallowing glaucoma have trouble controlling your muscles head injury heart disease history of irregular heartbeat if you often drink alcohol liver disease low blood counts, like low white cell, platelet, or red cell counts low blood pressure lung or breathing disease, like asthma Parkinson's disease prostate disease seizures trouble passing urine an unusual or allergic reaction to prochlorperazine, other medicines, foods, dyes, or preservatives pregnant or trying to get pregnant breast-feeding How should I use this medication? This medicine is for injection into a muscle, or injection or infusion into avein. It is given by a health care professional in a hospital or clinic setting. Talk to your pediatrician regarding the use of this medicine in children. While this drug may be prescribed for children as young as 68 years of age forselected conditions, precautions do apply. Overdosage: If you think you have taken too much of this medicine contact apoison control center or emergency room at once. NOTE: This medicine is only for you. Do not share this medicine with others. What if I miss a dose? This does not apply. What may interact with this medication? Do not take this medicine with any of the following medications: cisapride dofetilide dronedarone metoclopramide pimozide saquinavir thioridazine This medicine may also interact with the following medications: alcohol antihistamines for allergy, cough, and cold atropine certain medicines for anxiety or sleep certain medicines for bladder problems like oxybutynin, tolterodine certain medicines for depression like amitriptyline, fluoxetine, sertraline certain medicines for stomach problems like dicyclomine, hyoscyamine certain medicines for travel sickness like scopolamine epinephrine general anesthetics like halothane, isoflurane, methoxyflurane, propofol ipratropium levodopa or other medicines for Parkinson's  disease lithium medicines for blood pressure medicines for seizures like phenobarbital, primidone, phenytoin medicines that relax muscles for surgery narcotic medicines for pain propranolol warfarin This list may not describe all possible interactions. Give your health care provider a list of all the medicines, herbs, non-prescription drugs, or dietary supplements you use. Also tell them if you smoke, drink alcohol, or use illegaldrugs. Some items may interact with your medicine. What should I watch for while using this medication? Your condition will be monitored carefully while you are receiving thismedicine. You may get drowsy or dizzy. Do not drive, use machinery, or do anything that needs mental alertness until you know how this medicine affects you. Do not stand or sit up quickly, especially if you are an older patient. This reduces the risk of dizzy or fainting spells. Alcohol may interfere with the effect ofthis medicine. Avoid alcoholic drinks. This drug can cause problems with controlling your body temperature. It can lower the response of your body to cold temperatures. If possible, stay indoors during cold weather. If you must go outdoors, wear warm clothes. It can also lower the response of your body to heat. Do not overheat. Do not over-exercise. Stay out of the sun when possible. If you must be in the sun, wear cool clothing. Drink plenty of water. If you have trouble controlling your bodytemperature, call your health care provider right away. This medicine may increase blood sugar. Ask your health care provider ifchanges in diet or medicines are needed if you have diabetes. This medicine can make you more sensitive to the sun. Keep out of the sun. If you cannot avoid being in  the sun, wear protective clothing and use sunscreen.Do not use sun lamps or tanning beds/booths. Your mouth may get dry. Chewing sugarless gum or sucking hard candy, and drinking plenty of water may help. Contact  your doctor if the problem does notgo away or is severe. What side effects may I notice from receiving this medication? Side effects that you should report to your doctor or health care professionalas soon as possible: allergic reactions like skin rash, itching or hives, swelling of the face, lips, or tongue abnormal production of milk breast enlargement in both males and females changes in vision chest pain confusion fast, irregular heartbeat fever, chills, sore throat seizures signs and symptoms of high blood sugar such as being more thirsty or hungry or having to urinate more than normal. You may also feel very tired or have blurry vision. signs and symptoms of liver injury like dark yellow or brown urine; general ill feeling or flu-like symptoms; light-colored stools; loss of appetite; nausea; right upper belly pain; unusually weak or tired; yellowing of the eyes or skin signs and symptoms of low blood pressure like dizziness; feeling faint or lightheaded, falls; unusually weak or tired trouble passing urine or change in the amount of urine trouble swallowing uncontrollable movements of the arms, face, head, mouth, neck, or upper body unusual bruising or bleeding unusually weak or tired Side effects that usually do not require medical attention (report to yourdoctor or health care professional if they continue or are bothersome): constipation drowsiness dry mouth pain, redness, or irritation at site where injected This list may not describe all possible side effects. Call your doctor for medical advice about side effects. You may report side effects to FDA at1-800-FDA-1088. Where should I keep my medication? This drug is given in a hospital or clinic and will not be stored at home. NOTE: This sheet is a summary. It may not cover all possible information. If you have questions about this medicine, talk to your doctor, pharmacist, orhealth care provider.  2022 Elsevier/Gold Standard  (2019-01-31 12:22:52)

## 2020-09-05 NOTE — Progress Notes (Signed)
Pt has uti in past and still feels soreness. She says her eye md says that her eyes are dry.  B/p elevated

## 2020-09-09 ENCOUNTER — Encounter: Payer: Self-pay | Admitting: Oncology

## 2020-09-09 ENCOUNTER — Encounter: Payer: Self-pay | Admitting: Nurse Practitioner

## 2020-09-11 ENCOUNTER — Encounter: Payer: Self-pay | Admitting: Oncology

## 2020-09-11 ENCOUNTER — Encounter: Payer: Self-pay | Admitting: Nurse Practitioner

## 2020-09-13 ENCOUNTER — Inpatient Hospital Stay: Payer: Medicare Other

## 2020-09-13 ENCOUNTER — Encounter: Payer: Self-pay | Admitting: Oncology

## 2020-09-13 ENCOUNTER — Other Ambulatory Visit: Payer: Self-pay

## 2020-09-13 ENCOUNTER — Inpatient Hospital Stay (HOSPITAL_BASED_OUTPATIENT_CLINIC_OR_DEPARTMENT_OTHER): Payer: Medicare Other | Admitting: Oncology

## 2020-09-13 VITALS — BP 168/57 | HR 64 | Temp 96.8°F | Resp 16 | Wt 112.0 lb

## 2020-09-13 DIAGNOSIS — Z5111 Encounter for antineoplastic chemotherapy: Secondary | ICD-10-CM | POA: Diagnosis not present

## 2020-09-13 DIAGNOSIS — C259 Malignant neoplasm of pancreas, unspecified: Secondary | ICD-10-CM | POA: Diagnosis not present

## 2020-09-13 DIAGNOSIS — C250 Malignant neoplasm of head of pancreas: Secondary | ICD-10-CM

## 2020-09-13 LAB — COMPREHENSIVE METABOLIC PANEL
ALT: 12 U/L (ref 0–44)
AST: 28 U/L (ref 15–41)
Albumin: 3.5 g/dL (ref 3.5–5.0)
Alkaline Phosphatase: 65 U/L (ref 38–126)
Anion gap: 7 (ref 5–15)
BUN: 12 mg/dL (ref 8–23)
CO2: 27 mmol/L (ref 22–32)
Calcium: 8.6 mg/dL — ABNORMAL LOW (ref 8.9–10.3)
Chloride: 100 mmol/L (ref 98–111)
Creatinine, Ser: 0.81 mg/dL (ref 0.44–1.00)
GFR, Estimated: >60 mL/min (ref >60)
Glucose, Bld: 131 mg/dL — ABNORMAL HIGH (ref 70–99)
Potassium: 4.3 mmol/L (ref 3.5–5.1)
Sodium: 134 mmol/L — ABNORMAL LOW (ref 135–145)
Total Bilirubin: 0.8 mg/dL (ref 0.3–1.2)
Total Protein: 6.4 g/dL — ABNORMAL LOW (ref 6.5–8.1)

## 2020-09-13 LAB — CBC WITH DIFFERENTIAL/PLATELET
Abs Immature Granulocytes: 0.01 10*3/uL (ref 0.00–0.07)
Basophils Absolute: 0.1 10*3/uL (ref 0.0–0.1)
Basophils Relative: 1 %
Eosinophils Absolute: 0.3 10*3/uL (ref 0.0–0.5)
Eosinophils Relative: 5 %
HCT: 30.3 % — ABNORMAL LOW (ref 36.0–46.0)
Hemoglobin: 10.2 g/dL — ABNORMAL LOW (ref 12.0–15.0)
Immature Granulocytes: 0 %
Lymphocytes Relative: 11 %
Lymphs Abs: 0.7 10*3/uL (ref 0.7–4.0)
MCH: 33.7 pg (ref 26.0–34.0)
MCHC: 33.7 g/dL (ref 30.0–36.0)
MCV: 100 fL (ref 80.0–100.0)
Monocytes Absolute: 0.7 10*3/uL (ref 0.1–1.0)
Monocytes Relative: 12 %
Neutro Abs: 4.4 10*3/uL (ref 1.7–7.7)
Neutrophils Relative %: 71 %
Platelets: 285 10*3/uL (ref 150–400)
RBC: 3.03 MIL/uL — ABNORMAL LOW (ref 3.87–5.11)
RDW: 16.5 % — ABNORMAL HIGH (ref 11.5–15.5)
WBC: 6.2 10*3/uL (ref 4.0–10.5)
nRBC: 0 % (ref 0.0–0.2)

## 2020-09-13 MED ORDER — SODIUM CHLORIDE 0.9 % IV SOLN
Freq: Once | INTRAVENOUS | Status: AC
Start: 2020-09-13 — End: ?
  Filled 2020-09-13: qty 250

## 2020-09-13 MED ORDER — SODIUM CHLORIDE 0.9 % IV SOLN
INTRAVENOUS | Status: DC
  Filled 2020-09-13 (×2): qty 250

## 2020-09-13 MED ORDER — HEPARIN SOD (PORK) LOCK FLUSH 100 UNIT/ML IV SOLN
500.0000 [IU] | Freq: Once | INTRAVENOUS | Status: AC
  Administered 2020-09-13: 500 [IU]
  Filled 2020-09-13: qty 5

## 2020-09-13 MED ORDER — ONDANSETRON HCL 4 MG/2ML IJ SOLN
8.0000 mg | Freq: Once | INTRAMUSCULAR | Status: AC
Start: 2020-09-13 — End: 2020-09-13
  Administered 2020-09-13: 8 mg via INTRAVENOUS
  Filled 2020-09-13: qty 4

## 2020-09-13 MED ORDER — HEPARIN SOD (PORK) LOCK FLUSH 100 UNIT/ML IV SOLN
INTRAVENOUS | Status: AC
  Filled 2020-09-13: qty 5

## 2020-09-13 MED ORDER — SODIUM CHLORIDE 0.9 % IV SOLN: Freq: Once | INTRAVENOUS | Status: DC

## 2020-09-13 NOTE — Progress Notes (Signed)
Nutrition Follow-up:    Patient with pancreatic cancer.  S/p whipple on 01/2020 at [facility].  Patient receiving adjuvant chemotherapy.   Met with patient today in infusion.  She reports that she and Dr Janese Banks decided that she was not going to get any more chemotherapy.  Receiving fluids today.  Noted had episode of passing out recently.  Patient reports that she ate a good supper last night of her usual chicken pie, green beans and macaroni and cheese.  Son and Daughter-in-law ate with her.  Reports that she has been drinking more water sometimes 6-7 bottles and pedialyte.  Still has had issues with nausea.  Eating ice cream sometimes after lunch and supper    Medications: reviewed  Labs: reviewed  Anthropometrics:   Weight 112 lb today  111 lb on 6/3 113 lb on 5/13 118 lb on 1/28 118 lb on 1/20 121 lb on 12/30   NUTRITION DIAGNOSIS: Inadequate oral intake stable   INTERVENTION:  Patient continues to only eat foods that she knows does not hurt stomach or make her nauseated (chicken pie, green beans, macaroni and cheese, oatmeal, chicken and rice soup, ice cream).  Continue to encourage patient to eat high calorie, high protein foods for weight maintenance. Hope with chemotherapy ending nausea will be less frequent Discussed importance of hydration.  Patient has contact information    MONITORING, EVALUATION, GOAL: weight trends, intake   NEXT VISIT: phone call ~ 4 weeks  Chayse Zatarain B. Zenia Resides, [place], [place] Registered Dietitian [phone] (mobile)

## 2020-09-13 NOTE — Progress Notes (Signed)
Hematology/Oncology Consult note [facility]  [phone] [phone]  Patient Care Team: Baxter Hire, MD as PCP - General (Internal Medicine)   Name of the patient: Emily Harding  [id number]  May 20, 1940   Date of visit: 09/13/20  Diagnosis- pancreatic adenocarcinoma stage Ib T2 N0 M0 s/p Whipple surgery  Chief complaint/ Reason for visit-on treatment assessment prior to cycle 9-day 1 of gemcitabine chemotherapy adjuvant  Heme/Onc history:  patient is a 80 year old female with an oncology history as follows: 1.  On 11/22/2019 she presented with left lower quadrant abdominal pain and persistent nausea.  CT abdomen and pelvis showed a 3.1 x 2.3 cm mass in the head of the pancreas with diffuse pancreatic ductal dilatation.  CA 19-9 elevated at 166 2.  EUS showed 2.0 x 1.9 cm mass in the head of pancreas with no vessel involvement.  UT2N0.  Pathology showed adenocarcinoma 3.  CT chest showed 2 to 3 mm lung nodules with no evidence of distant metastatic disease.  In October 2021 patient underwent Whipple resection with pathology demonstrating 3.1 cm adenocarcinoma with mucinous features.  Uncinate margin positive for tumor.  Tumor approaches less than 1 mm within cautery.  Positive for PNI.  0 out of 18 lymph nodes positive for malignancy. 4.  Patient evaluated by medical oncology at [facility] and was recommended adjuvant chemotherapy followed by chemoradiation   Multiple adjuvant regimens including gem Abraxane, gemcitabine and Xeloda and gemcitabine single agent discussed with patient and patient decided to proceed with single agent gemcitabine starting 04/11/2020   Interval history-patient reports feeling fatigued since the start of chemotherapy which is affecting her day-to-day life.  A few days ago she passed out at her church.  Appetite is fair and her weight has remained stable.  She has chronic mild back pain  ECOG PS- 1 Pain scale-  0   Review of systems- Review of Systems  Constitutional:  Positive for malaise/fatigue. Negative for chills, fever and weight loss.  HENT:  Negative for congestion, ear discharge and nosebleeds.   Eyes:  Negative for blurred vision.  Respiratory:  Negative for cough, hemoptysis, sputum production, shortness of breath and wheezing.   Cardiovascular:  Negative for chest pain, palpitations, orthopnea and claudication.  Gastrointestinal:  Negative for abdominal pain, blood in stool, constipation, diarrhea, heartburn, melena, nausea and vomiting.  Genitourinary:  Negative for dysuria, flank pain, frequency, hematuria and urgency.  Musculoskeletal:  Negative for back pain, joint pain and myalgias.  Skin:  Negative for rash.  Neurological:  Negative for dizziness, tingling, focal weakness, seizures, weakness and headaches.  Endo/Heme/Allergies:  Does not bruise/bleed easily.  Psychiatric/Behavioral:  Negative for depression and suicidal ideas. The patient does not have insomnia.       Allergies  Allergen Reactions   Hydromorphone Hcl Itching   Azithromycin    Codeine Sulfate Other (See Comments)   Doxycycline Calcium Other (See Comments)   Duloxetine Nausea Only   Erythromycin Ethylsuccinate Other (See Comments)   Macrolides And Ketolides Other (See Comments)   Nitrofurantoin     Other reaction(s): Unknown Other reaction(s): Abdominal Pain Other reaction(s): Unknown   Sulfa Antibiotics      Past Medical History:  Diagnosis Date   Anxiety    Arthritis    Breast cancer ([place]) 1993   RT MASTECTOMY   Heartburn    HTN (hypertension)    Pancreatic cancer ([place])    Personal history of chemotherapy    S/P chemotherapy, time since  greater than 12 weeks 1993   BREAST CA     Past Surgical History:  Procedure Laterality Date   ABDOMINAL HYSTERECTOMY     colon polyp removal     GALLBLADDER SURGERY     LAPAROSCOPIC OOPHERECTOMY  1978   MASTECTOMY Right 1993   BREAST CA   PANCREAS  SURGERY N/A 01/04/2020   Per patient removed head of pancreas due to cancer   PORTA CATH INSERTION N/A 04/04/2020   Procedure: PORTA CATH INSERTION;  Surgeon: Algernon Huxley, MD;  Location: [facility];  Service: Cardiovascular;  Laterality: N/A;   removal of fallopian tubes  1989    Social History   Socioeconomic History   Marital status: Widowed    Spouse name: Not on file   Number of children: Not on file   Years of education: Not on file   Highest education level: Not on file  Occupational History   Not on file  Tobacco Use   Smoking status: Never   Smokeless tobacco: Never  Vaping Use   Vaping Use: Never used  Substance and Sexual Activity   Alcohol use: No   Drug use: No   Sexual activity: Not Currently  Other Topics Concern   Not on file  Social History Narrative   Not on file   Social Determinants of Health   Financial Resource Strain: Not on file  Food Insecurity: Not on file  Transportation Needs: Not on file  Physical Activity: Not on file  Stress: Not on file  Social Connections: Not on file  Intimate Partner Violence: Not on file    Family History  Problem Relation Age of Onset   Breast cancer Neg Hx    Kidney cancer Neg Hx    Bladder Cancer Neg Hx    Prostate cancer Neg Hx      Current Outpatient Medications:    aspirin EC 81 MG tablet, Take 81 mg by mouth daily., Disp: , Rfl:    atenolol (TENORMIN) 50 MG tablet, Take 50 mg by mouth 2 (two) times daily., Disp: , Rfl:    carboxymethylcellulose (REFRESH PLUS) 0.5 % SOLN, Apply to eye., Disp: , Rfl:    celecoxib (CELEBREX) 200 MG capsule, Take 200 mg by mouth daily., Disp: , Rfl:    cephALEXin (KEFLEX) 500 MG capsule, Take 500 mg by mouth 2 (two) times daily., Disp: , Rfl:    cetirizine (ZYRTEC) 10 MG tablet, Take by mouth., Disp: , Rfl:    clorazepate (TRANXENE) 3.75 MG tablet, Take 3.75 mg by mouth daily as needed., Disp: , Rfl:    gabapentin (NEURONTIN) 400 MG capsule, Take 400 mg by  mouth 3 (three) times daily., Disp: , Rfl:    Hydrocortisone Acetate (VAGISIL) 1 % CREA, Apply 1 application topically 3 (three) times daily as needed., Disp: 15 g, Rfl: 0   lidocaine-prilocaine (EMLA) cream, Apply small amount of cream over port site 1 1/2 hours before each treatment, place saran wrap over cream to protect clothin, Disp: 30 g, Rfl: 3   LORazepam (ATIVAN) 0.5 MG tablet, Take 1 tablet (0.5 mg total) by mouth every 6 (six) hours as needed (Nausea or vomiting)., Disp: 30 tablet, Rfl: 1   losartan (COZAAR) 100 MG tablet, Take 100 mg by mouth daily., Disp: , Rfl:    Multiple Vitamin (MULTI-VITAMINS) TABS, Take 1 tablet by mouth daily., Disp: , Rfl:    ondansetron (ZOFRAN) 8 MG tablet, Take 1 tablet (8 mg total) by mouth 2 (two)  times daily as needed (Nausea or vomiting)., Disp: 30 tablet, Rfl: 1   Pancrelipase, Lip-Prot-Amyl, 40000-126000 units CPEP, Take 1 Dose by mouth in the morning, at noon, in the evening, and at bedtime., Disp: , Rfl:    pantoprazole (PROTONIX) 40 MG tablet, Take 40 mg by mouth daily., Disp: , Rfl:    prochlorperazine (COMPAZINE) 10 MG tablet, Take 1 tablet (10 mg total) by mouth every 6 (six) hours as needed (Nausea or vomiting)., Disp: 30 tablet, Rfl: 1   traZODone (DESYREL) 100 MG tablet, Take 100 mg by mouth at bedtime., Disp: , Rfl:    amLODipine (NORVASC) 2.5 MG tablet, Take 2.5 mg by mouth daily. (Patient not taking: No sig reported), Disp: , Rfl:    cyclobenzaprine (FLEXERIL) 10 MG tablet, TAKE 1 TABLET (10 MG TOTAL) BY MOUTH 3 (THREE) TIMES DAILY AS NEEDED FOR MUSCLE SPASMS. (Patient not taking: No sig reported), Disp: , Rfl:  No current facility-administered medications for this visit.  Facility-Administered Medications Ordered in Other Visits:    0.9 %  sodium chloride infusion, , Intravenous, Once, Burns, Jennifer E, NP   0.9 %  sodium chloride infusion, , Intravenous, Once, Sindy Guadeloupe, MD   0.9 %  sodium chloride infusion, , Intravenous,  Continuous, Sindy Guadeloupe, MD, Stopped at 09/13/20 1257   alteplase (CATHFLO ACTIVASE) injection 2 mg, 2 mg, Intracatheter, Once PRN, Jacquelin Hawking, NP   heparin lock flush 100 unit/mL, 500 Units, Intracatheter, Once PRN, Sindy Guadeloupe, MD   heparin lock flush 100 unit/mL, 250 Units, Intracatheter, Once PRN, Jacquelin Hawking, NP   heparin lock flush 100 unit/mL, 500 Units, Intravenous, Once, Sindy Guadeloupe, MD   sodium chloride flush (NS) 0.9 % injection 10 mL, 10 mL, Intravenous, PRN, Sindy Guadeloupe, MD, 10 mL at 06/06/20 1039   sodium chloride flush (NS) 0.9 % injection 10 mL, 10 mL, Intracatheter, PRN, Sindy Guadeloupe, MD   sodium chloride flush (NS) 0.9 % injection 10 mL, 10 mL, Intracatheter, PRN, Faythe Casa E, NP, 10 mL at 06/20/20 1030   sodium chloride flush (NS) 0.9 % injection 10 mL, 10 mL, Intravenous, PRN, Faythe Casa E, NP, 10 mL at 08/12/20 1335   sodium chloride flush (NS) 0.9 % injection 10 mL, 10 mL, Intravenous, PRN, Borders, Vonna Kotyk R, NP, 10 mL at 09/02/20 1411   sodium chloride flush (NS) 0.9 % injection 3 mL, 3 mL, Intracatheter, PRN, Jacquelin Hawking, NP  Physical exam:  Vitals:   09/13/20 1029  BP: (!) 168/57  Pulse: 64  Resp: 16  Temp: (!) 96.8 F (36 C)  TempSrc: Tympanic  SpO2: 100%  Weight: 112 lb (50.8 kg)   Physical Exam Constitutional:      General: She is not in acute distress. Cardiovascular:     Rate and Rhythm: Normal rate and regular rhythm.     Heart sounds: Normal heart sounds.  Pulmonary:     Effort: Pulmonary effort is normal.     Breath sounds: Normal breath sounds.  Abdominal:     General: Bowel sounds are normal.     Palpations: Abdomen is soft.  Skin:    General: Skin is warm and dry.  Neurological:     Mental Status: She is alert and oriented to person, place, and time.     CMP Latest Ref Rng & Units 09/13/2020  Glucose 70 - 99 mg/dL 131(H)  BUN 8 - 23 mg/dL 12  Creatinine 0.44 - 1.00 mg/dL 0.81  Sodium 135 - 145  mmol/L 134(L)  Potassium 3.5 - 5.1 mmol/L 4.3  Chloride 98 - 111 mmol/L 100  CO2 22 - 32 mmol/L 27  Calcium 8.9 - 10.3 mg/dL 8.6(L)  Total Protein 6.5 - 8.1 g/dL 6.4(L)  Total Bilirubin 0.3 - 1.2 mg/dL 0.8  Alkaline Phos 38 - 126 U/L 65  AST 15 - 41 U/L 28  ALT 0 - 44 U/L 12   CBC Latest Ref Rng & Units 09/13/2020  WBC 4.0 - 10.5 K/uL 6.2  Hemoglobin 12.0 - 15.0 g/dL 10.2(L)  Hematocrit 36.0 - 46.0 % 30.3(L)  Platelets 150 - 400 K/uL 285     Assessment and plan- Patient is a 81 y.o. female with pancreatic adenocarcinoma stage IB T2 N0 M0 s/p Whipple surgery.  She is here for on treatment assessment prior to cycle 9-day 1 of gemcitabine chemotherapy  Patient is progressively fatigued since the start of chemotherapy which is gradually affecting her quality of life and ADLs.  She has completed about 5 months of adjuvant chemotherapy so far.  She feels especially more fatigued today and I will hold off on giving her any further adjuvant chemotherapy keeping in mind her age and preserving her quality of life.  We discussed about the positive uncinate margin and need for chemoradiation ideal case scenario.  Patient does not wish to proceed with that either at this time.  Moving forward I will continue to do her surveillance scans every 3 to 6 months.  Her last scan in April 22 did not show any evidence of malignancy.  I will see her back in 2 months with scans prior and she will see NP Altha Harm in 1 month in the interim.  We will give her 1 L of IV fluids today.  Patient will call us if she has any questions or concerns   Visit Diagnosis 1. Pancreatic adenocarcinoma [facility])      Dr. Randa Evens, MD, MPH [facility] [phone] 09/13/2020 5:16 PM

## 2020-09-13 NOTE — Patient Instructions (Signed)
[facility]  Discharge Instructions: Thank you for choosing [facility] to provide your oncology and hematology care.  If you have a lab appointment with the [facility], please go directly to the [facility] and check in at the registration area.  Wear comfortable clothing and clothing appropriate for easy access to any Portacath or PICC line.   We strive to give you quality time with your provider. You may need to reschedule your appointment if you arrive late (15 or more minutes).  Arriving late affects you and other patients whose appointments are after yours.  Also, if you miss three or more appointments without notifying the office, you may be dismissed from the clinic at the provider's discretion.      For prescription refill requests, have your pharmacy contact our office and allow 72 hours for refills to be completed.    Today you received the following : Hydration / Zofran   To help prevent nausea and vomiting after your treatment, we encourage you to take your nausea medication as directed.  BELOW ARE SYMPTOMS THAT SHOULD BE REPORTED IMMEDIATELY: *FEVER GREATER THAN 100.4 F (38 C) OR HIGHER *CHILLS OR SWEATING *NAUSEA AND VOMITING THAT IS NOT CONTROLLED WITH YOUR NAUSEA MEDICATION *UNUSUAL SHORTNESS OF BREATH *UNUSUAL BRUISING OR BLEEDING *URINARY PROBLEMS (pain or burning when urinating, or frequent urination) *BOWEL PROBLEMS (unusual diarrhea, constipation, pain near the anus) TENDERNESS IN MOUTH AND THROAT WITH OR WITHOUT PRESENCE OF ULCERS (sore throat, sores in mouth, or a toothache) UNUSUAL RASH, SWELLING OR PAIN  UNUSUAL VAGINAL DISCHARGE OR ITCHING   Items with * indicate a potential emergency and should be followed up as soon as possible or go to the Emergency Department if any problems should occur.  Please show the CHEMOTHERAPY ALERT CARD or IMMUNOTHERAPY ALERT CARD at check-in to the Emergency Department and  triage nurse.  Should you have questions after your visit or need to cancel or reschedule your appointment, please contact [facility]  [phone] and follow the prompts.  Office hours are 8:00 a.m. to 4:30 p.m. Monday - Friday. Please note that voicemails left after 4:00 p.m. may not be returned until the following business day.  We are closed weekends and major holidays. You have access to a nurse at all times for urgent questions. Please call the main number to the clinic [phone] and follow the prompts.  For any non-urgent questions, you may also contact your provider using MyChart. We now offer e-Visits for anyone 5 and older to request care online for non-urgent symptoms. For details visit [email or web address].   Also download the MyChart app! Go to the app store, search "MyChart", open the app, select [facility], and log in with your MyChart username and password.  Due to Covid, a mask is required upon entering the hospital/clinic. If you do not have a mask, one will be given to you upon arrival. For doctor visits, patients may have 1 support person aged 76 or older with them. For treatment visits, patients cannot have anyone with them due to current Covid guidelines and our immunocompromised population.

## 2020-09-13 NOTE — Progress Notes (Signed)
Patient here for oncology follow-up appointment, expresses concerns of lightheaded, constipation and nausea

## 2020-09-14 LAB — CANCER ANTIGEN 19-9: CA 19-9: 8 U/mL (ref 0–35)

## 2020-09-16 ENCOUNTER — Telehealth: Payer: Self-pay | Admitting: *Deleted

## 2020-09-16 NOTE — Telephone Encounter (Signed)
Josh- can you please call her and se ehow we can help? Her repeat scans are in august

## 2020-09-16 NOTE — Telephone Encounter (Signed)
Back pain? Can you specify which pain?

## 2020-09-16 NOTE — Telephone Encounter (Signed)
Yes, sorry her back she said she started to tell you about it when she was in office , but then got side tracked with another conversation. Asking if she can see a Chiropractor for it

## 2020-09-16 NOTE — Telephone Encounter (Signed)
Patient called asking if she can go to Chiropractor or what can be done for it stating that her pain medicine is doing nothing for it. Please advise

## 2020-09-17 NOTE — Telephone Encounter (Signed)
Can you move her scan up by 2-3 weeks?

## 2020-09-17 NOTE — Telephone Encounter (Signed)
No more chemo for her. She will need to drink po contrast. She should take nausea pill prior.

## 2020-09-17 NOTE — Telephone Encounter (Signed)
Yes working on moving it up

## 2020-09-17 NOTE — Telephone Encounter (Signed)
Pts CT moved up to 7/12, also she is stating they stopped her last 2 chemos. Do I need to cancel her one for Friday? Also stated she didn't have to drink anything for her CT at [facility] does she have to have that this time it makes her nausea. Please advise?

## 2020-09-17 NOTE — Telephone Encounter (Signed)
Im sorry Dr. Janese Banks pt is wanting to talk to you about the PO she is not wanting to drink it and wants you to explain to her why she cant do it like she did a [facility] and not do that.

## 2020-09-17 NOTE — Telephone Encounter (Signed)
I talked with patient by phone.  She describes history of degenerative back disease with chronic low back pain and radiculopathy into the lower buttocks.  Patient says her PCP had previously dose reduced her gabapentin to 1 tablet 3 times daily, whereas she was previously taking 2 tablets at bedtime.  She feels like this has contributed to worsening pain.  I agreed the patient could go back to gabapentin 2 tablets at bedtime.  Additionally, she has Norco at home but is not taking it.  I encouraged her to take the Norco 3-4 times a day as needed.  Additionally, I recommended that she could try OTC lidocaine patches.  She says that she has previously used Lidoderm patches while hospitalized with good effect.  Patient did request if it would be possible to move her CT scans sooner.  What you think Dr. Janese Banks?

## 2020-09-19 ENCOUNTER — Encounter: Payer: Self-pay | Admitting: Oncology

## 2020-09-19 ENCOUNTER — Encounter: Payer: Self-pay | Admitting: Nurse Practitioner

## 2020-09-19 MED ORDER — HYDROCODONE-ACETAMINOPHEN 5-325 MG PO TABS: 1.0000 | ORAL_TABLET | ORAL | 0 refills | Status: AC | PRN

## 2020-09-19 NOTE — Telephone Encounter (Signed)
Spoke with patient.  We discussed her CT and the rationale behind use of contrast.  Encourage patient to take antiemetics beforehand.  Patient was in agreement with plan.  She does say that her pain is improved with use of Norco.  She did request refill of Norco.  Will send Rx.  We also discussed constipation management and encouraged use of senna/MiraLAX.  PDMP reviewed.

## 2020-09-19 NOTE — Addendum Note (Signed)
Addended by: Altha Harm R on: 09/19/2020 11:09 AM   Modules accepted: Orders

## 2020-09-20 ENCOUNTER — Inpatient Hospital Stay: Payer: Medicare Other

## 2020-09-25 ENCOUNTER — Inpatient Hospital Stay: Payer: Medicare Other | Attending: Oncology

## 2020-09-25 ENCOUNTER — Other Ambulatory Visit: Payer: Self-pay

## 2020-09-25 ENCOUNTER — Telehealth: Payer: Self-pay | Admitting: Oncology

## 2020-09-25 VITALS — BP 189/67 | HR 67 | Temp 97.3°F | Resp 20

## 2020-09-25 DIAGNOSIS — C250 Malignant neoplasm of head of pancreas: Secondary | ICD-10-CM | POA: Insufficient documentation

## 2020-09-25 DIAGNOSIS — E860 Dehydration: Secondary | ICD-10-CM

## 2020-09-25 DIAGNOSIS — Z9221 Personal history of antineoplastic chemotherapy: Secondary | ICD-10-CM | POA: Diagnosis not present

## 2020-09-25 DIAGNOSIS — Z9079 Acquired absence of other genital organ(s): Secondary | ICD-10-CM | POA: Insufficient documentation

## 2020-09-25 DIAGNOSIS — Z79899 Other long term (current) drug therapy: Secondary | ICD-10-CM | POA: Insufficient documentation

## 2020-09-25 DIAGNOSIS — Z95828 Presence of other vascular implants and grafts: Secondary | ICD-10-CM

## 2020-09-25 DIAGNOSIS — R531 Weakness: Secondary | ICD-10-CM | POA: Insufficient documentation

## 2020-09-25 DIAGNOSIS — Z7982 Long term (current) use of aspirin: Secondary | ICD-10-CM | POA: Diagnosis not present

## 2020-09-25 DIAGNOSIS — I10 Essential (primary) hypertension: Secondary | ICD-10-CM | POA: Diagnosis not present

## 2020-09-25 DIAGNOSIS — F419 Anxiety disorder, unspecified: Secondary | ICD-10-CM | POA: Insufficient documentation

## 2020-09-25 DIAGNOSIS — Z853 Personal history of malignant neoplasm of breast: Secondary | ICD-10-CM | POA: Insufficient documentation

## 2020-09-25 MED ORDER — SODIUM CHLORIDE 0.9 % IV SOLN
Freq: Once | INTRAVENOUS | Status: AC
Start: 2020-09-26 — End: ?
  Filled 2020-09-25: qty 250

## 2020-09-25 MED ORDER — SODIUM CHLORIDE 0.9 % IV SOLN
Freq: Once | INTRAVENOUS | Status: AC
  Filled 2020-09-25: qty 250

## 2020-09-25 MED ORDER — SODIUM CHLORIDE 0.9% FLUSH
10.0000 mL | INTRAVENOUS | Status: DC | PRN
  Administered 2020-09-25: 10 mL via INTRAVENOUS
  Filled 2020-09-25: qty 10

## 2020-09-25 MED ORDER — HEPARIN SOD (PORK) LOCK FLUSH 100 UNIT/ML IV SOLN
500.0000 [IU] | Freq: Once | INTRAVENOUS | Status: AC
  Administered 2020-09-25: 500 [IU] via INTRAVENOUS
  Filled 2020-09-25: qty 5

## 2020-09-25 MED ORDER — ACETAMINOPHEN 325 MG PO TABS: 650.0000 mg | ORAL_TABLET | Freq: Four times a day (QID) | ORAL | Status: DC | PRN

## 2020-09-25 MED ORDER — HEPARIN SOD (PORK) LOCK FLUSH 100 UNIT/ML IV SOLN
INTRAVENOUS | Status: AC
  Filled 2020-09-25: qty 5

## 2020-09-25 NOTE — Telephone Encounter (Signed)
Re: IV fluids.   Patient's daughter Rodena Piety presented to cancer center today requesting her mother receive IV fluids.  Attempted to contact daughter to have patient brought in for IV fluids.  No answer.  Left voicemail.  Faythe Casa, NP 09/25/2020 1:33 PM

## 2020-09-25 NOTE — Progress Notes (Cosign Needed)
ERROR

## 2020-09-25 NOTE — Progress Notes (Signed)
Symptom Management Consult Note [facility]  [phone] [phone]  Patient Care Team: Baxter Hire, MD as PCP - General (Internal Medicine)   Name of the patient: Emily Harding  [id number]  [birth year]   Date of visit: 09/25/2020  Diagnosis:   Chief Complaint: Weakness, Nausea  Current Treatment: Completed chemo 3 weeks ago  Oncology History:  Oncology History  Malignant neoplasm of head of pancreas ([place])  03/21/2020 Initial Diagnosis   Malignant neoplasm of head of pancreas ([place])    03/21/2020 Cancer Staging   Staging form: Exocrine Pancreas, AJCC 8th Edition - Pathologic stage from 03/21/2020: Stage IB (pT2, pN0, cM0) - Signed by Sindy Guadeloupe, MD on 03/21/2020    04/11/2020 -  Chemotherapy    Patient is on Treatment Plan: PANCREAS GEMCITABINE D1,8,15 Q28D X 1 CYCLE         Subjective Data:  ECOG: 1 - Symptomatic but completely ambulatory  HPI: Patient called infusion clinic this morning after experiencing an episode of dizziness this morning. She reports this has not happened before.   She reports she has passed out at church before, but has never had dizziness upon standing in the morning.   She denies headache, chest pain, shortness of breath, eye changes (blurred vision), vomiting, abdominal pain, weakness, swelling in her lower extremities. Also denies fevers/weight changes.   She reports the episode this morning lasted approximately 30 mins and resolved after drinking 1/2 water bottle.   She reports chronic back pain which she was recently started on hydrocodone. She reports taking this medication "some days" including today.   Review of Systems  Constitutional:  Negative for fever, malaise/fatigue and weight loss.  HENT:  Negative for congestion and hearing loss.   Eyes:  Negative for blurred vision and double vision.  Respiratory:  Negative for cough.   Cardiovascular:  Negative for chest pain and  palpitations.  Gastrointestinal:  Positive for constipation. Negative for abdominal pain, diarrhea, nausea and vomiting.  Genitourinary:  Negative for frequency and urgency.  Musculoskeletal:  Positive for back pain. Negative for falls.  Skin:  Negative for rash.  Neurological:  Positive for dizziness. Negative for tingling, sensory change, speech change, focal weakness, loss of consciousness and headaches.  Endo/Heme/Allergies:  Does not bruise/bleed easily.  Psychiatric/Behavioral:  Negative for depression. The patient is nervous/anxious. The patient does not have insomnia.     Past Medical History:  Diagnosis Date   Anxiety    Arthritis    Breast cancer ([place]) 1993   RT MASTECTOMY   Heartburn    HTN (hypertension)    Pancreatic cancer ([place])    Personal history of chemotherapy    S/P chemotherapy, time since greater than 12 weeks 1993   BREAST CA   Past Surgical History:  Procedure Laterality Date   ABDOMINAL HYSTERECTOMY     colon polyp removal     GALLBLADDER SURGERY     LAPAROSCOPIC OOPHERECTOMY  1978   MASTECTOMY Right 1993   BREAST CA   PANCREAS SURGERY N/A 01/04/2020   Per patient removed head of pancreas due to cancer   PORTA CATH INSERTION N/A 04/04/2020   Procedure: PORTA CATH INSERTION;  Surgeon: Algernon Huxley, MD;  Location: [facility];  Service: Cardiovascular;  Laterality: N/A;   removal of fallopian tubes  1989   Allergies  Allergen Reactions   Hydromorphone Hcl Itching   Azithromycin    Codeine Sulfate Other (See Comments)   Doxycycline Calcium Other (  See Comments)   Duloxetine Nausea Only   Erythromycin Ethylsuccinate Other (See Comments)   Macrolides And Ketolides Other (See Comments)   Nitrofurantoin     Other reaction(s): Unknown Other reaction(s): Abdominal Pain Other reaction(s): Unknown   Sulfa Antibiotics    Current Outpatient Medications on File Prior to Visit  Medication Sig Dispense Refill   amLODipine (NORVASC) 2.5 MG tablet  Take 2.5 mg by mouth daily. (Patient not taking: No sig reported)     aspirin EC 81 MG tablet Take 81 mg by mouth daily.     atenolol (TENORMIN) 50 MG tablet Take 50 mg by mouth 2 (two) times daily.     carboxymethylcellulose (REFRESH PLUS) 0.5 % SOLN Apply to eye.     celecoxib (CELEBREX) 200 MG capsule Take 200 mg by mouth daily.     cephALEXin (KEFLEX) 500 MG capsule Take 500 mg by mouth 2 (two) times daily.     cetirizine (ZYRTEC) 10 MG tablet Take by mouth.     clorazepate (TRANXENE) 3.75 MG tablet Take 3.75 mg by mouth daily as needed.     cyclobenzaprine (FLEXERIL) 10 MG tablet TAKE 1 TABLET (10 MG TOTAL) BY MOUTH 3 (THREE) TIMES DAILY AS NEEDED FOR MUSCLE SPASMS. (Patient not taking: No sig reported)     gabapentin (NEURONTIN) 400 MG capsule Take 400 mg by mouth 3 (three) times daily.     HYDROcodone-acetaminophen (NORCO) 5-325 MG tablet Take 1 tablet by mouth every 4 (four) hours as needed for moderate pain. 45 tablet 0   Hydrocortisone Acetate (VAGISIL) 1 % CREA Apply 1 application topically 3 (three) times daily as needed. 15 g 0   lidocaine-prilocaine (EMLA) cream Apply small amount of cream over port site 1 1/2 hours before each treatment, place saran wrap over cream to protect clothin 30 g 3   LORazepam (ATIVAN) 0.5 MG tablet Take 1 tablet (0.5 mg total) by mouth every 6 (six) hours as needed (Nausea or vomiting). 30 tablet 1   losartan (COZAAR) 100 MG tablet Take 100 mg by mouth daily.     Multiple Vitamin (MULTI-VITAMINS) TABS Take 1 tablet by mouth daily.     ondansetron (ZOFRAN) 8 MG tablet Take 1 tablet (8 mg total) by mouth 2 (two) times daily as needed (Nausea or vomiting). 30 tablet 1   Pancrelipase, Lip-Prot-Amyl, 40000-126000 units CPEP Take 1 Dose by mouth in the morning, at noon, in the evening, and at bedtime.     pantoprazole (PROTONIX) 40 MG tablet Take 40 mg by mouth daily.     prochlorperazine (COMPAZINE) 10 MG tablet Take 1 tablet (10 mg total) by mouth every 6  (six) hours as needed (Nausea or vomiting). 30 tablet 1   traZODone (DESYREL) 100 MG tablet Take 100 mg by mouth at bedtime.     Current Facility-Administered Medications on File Prior to Visit  Medication Dose Route Frequency Provider Last Rate Last Admin   0.9 %  sodium chloride infusion   Intravenous Once Faythe Casa E, NP       0.9 %  sodium chloride infusion   Intravenous Once Sindy Guadeloupe, MD       [START ON 09/26/2020] 0.9 %  sodium chloride infusion   Intravenous Once Sindy Guadeloupe, MD       alteplase (CATHFLO ACTIVASE) injection 2 mg  2 mg Intracatheter Once PRN Jacquelin Hawking, NP       heparin lock flush 100 unit/mL  500 Units Intracatheter Once PRN Janese Banks,  Weston Anna, MD       heparin lock flush 100 unit/mL  250 Units Intracatheter Once PRN Jacquelin Hawking, NP       heparin lock flush 100 unit/mL  500 Units Intravenous Once Sindy Guadeloupe, MD       sodium chloride flush (NS) 0.9 % injection 10 mL  10 mL Intravenous PRN Sindy Guadeloupe, MD   10 mL at 06/06/20 1039   sodium chloride flush (NS) 0.9 % injection 10 mL  10 mL Intracatheter PRN Sindy Guadeloupe, MD       sodium chloride flush (NS) 0.9 % injection 10 mL  10 mL Intracatheter PRN Jacquelin Hawking, NP   10 mL at 06/20/20 1030   sodium chloride flush (NS) 0.9 % injection 10 mL  10 mL Intravenous PRN Jacquelin Hawking, NP   10 mL at 08/12/20 1335   sodium chloride flush (NS) 0.9 % injection 10 mL  10 mL Intravenous PRN Borders, Kirt Boys, NP   10 mL at 09/02/20 1411   sodium chloride flush (NS) 0.9 % injection 3 mL  3 mL Intracatheter PRN Jacquelin Hawking, NP        CBC    Component Value Date/Time   WBC 6.2 09/13/2020 1010   RBC 3.03 (L) 09/13/2020 1010   HGB 10.2 (L) 09/13/2020 1010   HCT 30.3 (L) 09/13/2020 1010   PLT 285 09/13/2020 1010   MCV 100.0 09/13/2020 1010   MCH 33.7 09/13/2020 1010   MCHC 33.7 09/13/2020 1010   RDW 16.5 (H) 09/13/2020 1010   LYMPHSABS 0.7 09/13/2020 1010   MONOABS 0.7 09/13/2020 1010    EOSABS 0.3 09/13/2020 1010   BASOSABS 0.1 09/13/2020 1010     Chemistry      Component Value Date/Time   NA 134 (L) 09/13/2020 1010   K 4.3 09/13/2020 1010   CL 100 09/13/2020 1010   CO2 27 09/13/2020 1010   BUN 12 09/13/2020 1010   CREATININE 0.81 09/13/2020 1010      Component Value Date/Time   CALCIUM 8.6 (L) 09/13/2020 1010   ALKPHOS 65 09/13/2020 1010   AST 28 09/13/2020 1010   ALT 12 09/13/2020 1010   BILITOT 0.8 09/13/2020 1010     BP (!) 189/67 (BP Location: Left Arm, Patient Position: Sitting)   Pulse 67   Temp (!) 97.3 F (36.3 C) (Tympanic)   Resp 20   SpO2 98%   Physical Exam Vitals and nursing note reviewed.  Constitutional:      General: She is not in acute distress.    Appearance: Normal appearance. She is normal weight. She is not diaphoretic.  HENT:     Head: Normocephalic and atraumatic.     Nose: No congestion.     Mouth/Throat:     Mouth: Mucous membranes are moist.     Pharynx: Oropharynx is clear.  Eyes:     General:        Right eye: No discharge.        Left eye: No discharge.     Extraocular Movements: Extraocular movements intact.     Pupils: Pupils are equal, round, and reactive to light.     Comments: Wearing glasses  Cardiovascular:     Rate and Rhythm: Normal rate and regular rhythm.     Pulses: Normal pulses.     Heart sounds: No murmur heard. Pulmonary:     Effort: Pulmonary effort is normal.  Abdominal:  General: Bowel sounds are normal. There is no distension.     Tenderness: There is no abdominal tenderness. There is no guarding.  Musculoskeletal:        General: No swelling or deformity.     Right lower leg: No edema.     Left lower leg: No edema.  Skin:    General: Skin is warm and dry.     Capillary Refill: Capillary refill takes less than 2 seconds.  Neurological:     General: No focal deficit present.     Mental Status: She is alert and oriented to person, place, and time. Mental status is at baseline.   Psychiatric:        Thought Content: Thought content normal.     Assessment & Plan:  Dizziness and Nausea  -Likely Orthostatic hypotension because onset was this morning after getting out of bed  -No concerning associated symptoms  -1L of fluid today -Nausea medication taken at home. Patient reports nausea has resolved.  -Patient was able to eat oatmeal for breakfast and burger and potato for lunch  Back Pain -Patient taking norco for pain -650mg  tylenol given in clinic today   Elevated BP  -Recheck BP after 1L of fluid this afternoon   Patient instructed to call clinic with worsening fatigue/dizziness.   The patient's diagnosis, an outline of the further diagnostic and laboratory studies which will be required, the recommendation for surgery, and alternatives were discussed with her and her accompanying family members.  All questions were answered to their satisfaction.  I personally had a face to face interaction and evaluated the patient jointly with the NP Student, Mrs. Benedetto Goad.  I have reviewed her history and available records and have performed the key portions of the physical exam including general, HEENT, abdominal exam, pelvic exam with my findings confirming those documented above by the APP student.  I have discussed the case with the APP student and the patient.  I agree with the above documentation, assessment and plan which was fully formulated by me.  Counseling was completed by me.    Benedetto Goad, Student FNP

## 2020-09-26 ENCOUNTER — Ambulatory Visit: Payer: Medicare Other

## 2020-09-27 ENCOUNTER — Other Ambulatory Visit: Payer: Self-pay | Admitting: Oncology

## 2020-09-27 DIAGNOSIS — C250 Malignant neoplasm of head of pancreas: Secondary | ICD-10-CM

## 2020-09-30 ENCOUNTER — Other Ambulatory Visit: Payer: Self-pay

## 2020-09-30 ENCOUNTER — Telehealth: Payer: Self-pay

## 2020-09-30 ENCOUNTER — Telehealth: Payer: Self-pay | Admitting: *Deleted

## 2020-09-30 DIAGNOSIS — R531 Weakness: Secondary | ICD-10-CM

## 2020-09-30 NOTE — Telephone Encounter (Signed)
Patient callrequestion phone conversation with Casandra Doffing NP regarding back pain and medication management.

## 2020-09-30 NOTE — Telephone Encounter (Signed)
Patient says that she does not like to take the pain medication as it is constipating.  She continues to have occasional back pain and requests a referral to [facility].  We will send referral.

## 2020-10-01 ENCOUNTER — Telehealth: Payer: Self-pay | Admitting: *Deleted

## 2020-10-01 ENCOUNTER — Ambulatory Visit: Payer: Medicare Other

## 2020-10-01 NOTE — Telephone Encounter (Signed)
GOT A CALL  FROM Linus Orn at [facility] and they are not part of Epic and needs written order. I printed out the order and had Josh to sign it and faxed to her at [phone]

## 2020-10-09 ENCOUNTER — Other Ambulatory Visit: Payer: Self-pay

## 2020-10-09 ENCOUNTER — Inpatient Hospital Stay: Payer: Medicare Other

## 2020-10-09 ENCOUNTER — Telehealth: Payer: Self-pay | Admitting: *Deleted

## 2020-10-09 VITALS — BP 184/74 | HR 64 | Temp 98.3°F | Resp 18

## 2020-10-09 DIAGNOSIS — C250 Malignant neoplasm of head of pancreas: Secondary | ICD-10-CM | POA: Diagnosis not present

## 2020-10-09 DIAGNOSIS — C259 Malignant neoplasm of pancreas, unspecified: Secondary | ICD-10-CM

## 2020-10-09 MED ORDER — SODIUM CHLORIDE 0.9% FLUSH
10.0000 mL | Freq: Once | INTRAVENOUS | Status: DC | PRN
Start: 2020-10-09 — End: 2020-10-09
  Filled 2020-10-09: qty 10

## 2020-10-09 MED ORDER — HEPARIN SOD (PORK) LOCK FLUSH 100 UNIT/ML IV SOLN
500.0000 [IU] | Freq: Once | INTRAVENOUS | Status: DC | PRN
  Filled 2020-10-09: qty 5

## 2020-10-09 MED ORDER — SODIUM CHLORIDE 0.9 % IV SOLN
Freq: Once | INTRAVENOUS | Status: AC
  Filled 2020-10-09: qty 250

## 2020-10-09 NOTE — Telephone Encounter (Signed)
Josh please call her

## 2020-10-09 NOTE — Telephone Encounter (Signed)
I have called patient and scheduled her for fluids today. She reports feeling weak and shaky, but denies nausea, vomiting, diarrhea.

## 2020-10-09 NOTE — Telephone Encounter (Signed)
Patient called to report that she is feeling very weak and shaky today and feels like she needs IV fluids.

## 2020-10-14 ENCOUNTER — Encounter: Payer: Self-pay | Admitting: Hospice and Palliative Medicine

## 2020-10-14 ENCOUNTER — Inpatient Hospital Stay: Payer: Medicare Other

## 2020-10-14 ENCOUNTER — Other Ambulatory Visit: Payer: Self-pay

## 2020-10-14 ENCOUNTER — Inpatient Hospital Stay (HOSPITAL_BASED_OUTPATIENT_CLINIC_OR_DEPARTMENT_OTHER): Payer: Medicare Other | Admitting: Hospice and Palliative Medicine

## 2020-10-14 VITALS — BP 182/75 | HR 69 | Temp 98.9°F | Resp 20 | Wt 111.6 lb

## 2020-10-14 DIAGNOSIS — C250 Malignant neoplasm of head of pancreas: Secondary | ICD-10-CM

## 2020-10-14 DIAGNOSIS — C259 Malignant neoplasm of pancreas, unspecified: Secondary | ICD-10-CM

## 2020-10-14 LAB — CBC WITH DIFFERENTIAL/PLATELET
Abs Immature Granulocytes: 0.02 10*3/uL (ref 0.00–0.07)
Basophils Absolute: 0.1 10*3/uL (ref 0.0–0.1)
Basophils Relative: 1 %
Eosinophils Absolute: 0.4 10*3/uL (ref 0.0–0.5)
Eosinophils Relative: 5 %
HCT: 38.5 % (ref 36.0–46.0)
Hemoglobin: 12.6 g/dL (ref 12.0–15.0)
Immature Granulocytes: 0 %
Lymphocytes Relative: 9 %
Lymphs Abs: 0.7 10*3/uL (ref 0.7–4.0)
MCH: 32.4 pg (ref 26.0–34.0)
MCHC: 32.7 g/dL (ref 30.0–36.0)
MCV: 99 fL (ref 80.0–100.0)
Monocytes Absolute: 0.5 10*3/uL (ref 0.1–1.0)
Monocytes Relative: 7 %
Neutro Abs: 5.7 10*3/uL (ref 1.7–7.7)
Neutrophils Relative %: 78 %
Platelets: 259 10*3/uL (ref 150–400)
RBC: 3.89 MIL/uL (ref 3.87–5.11)
RDW: 13.2 % (ref 11.5–15.5)
WBC: 7.3 10*3/uL (ref 4.0–10.5)
nRBC: 0 % (ref 0.0–0.2)

## 2020-10-14 LAB — COMPREHENSIVE METABOLIC PANEL
ALT: 16 U/L (ref 0–44)
AST: 28 U/L (ref 15–41)
Albumin: 4.1 g/dL (ref 3.5–5.0)
Alkaline Phosphatase: 60 U/L (ref 38–126)
Anion gap: 7 (ref 5–15)
BUN: 16 mg/dL (ref 8–23)
CO2: 29 mmol/L (ref 22–32)
Calcium: 9 mg/dL (ref 8.9–10.3)
Chloride: 97 mmol/L — ABNORMAL LOW (ref 98–111)
Creatinine, Ser: 0.93 mg/dL (ref 0.44–1.00)
GFR, Estimated: >60 mL/min (ref >60)
Glucose, Bld: 110 mg/dL — ABNORMAL HIGH (ref 70–99)
Potassium: 4.2 mmol/L (ref 3.5–5.1)
Sodium: 133 mmol/L — ABNORMAL LOW (ref 135–145)
Total Bilirubin: 0.8 mg/dL (ref 0.3–1.2)
Total Protein: 7 g/dL (ref 6.5–8.1)

## 2020-10-14 MED ORDER — LORAZEPAM 0.5 MG PO TABS: 0.5000 mg | ORAL_TABLET | Freq: Four times a day (QID) | ORAL | 1 refills | Status: DC | PRN

## 2020-10-14 NOTE — Progress Notes (Signed)
Hematology/Oncology Consult note [facility]  [phone] [phone]  Patient Care Team: Baxter Hire, MD as PCP - General (Internal Medicine)   Name of the patient: Emily Harding  [id number]  1940-09-22   Date of visit: 10/14/20  Diagnosis- pancreatic adenocarcinoma stage Ib T2 N0 M0 s/p Whipple surgery  Chief complaint/ Reason for visit-on treatment assessment prior to cycle 9-day 1 of gemcitabine chemotherapy adjuvant  Heme/Onc history:  patient is a 80 year old female with an oncology history as follows: 1.  On 11/22/2019 she presented with left lower quadrant abdominal pain and persistent nausea.  CT abdomen and pelvis showed a 3.1 x 2.3 cm mass in the head of the pancreas with diffuse pancreatic ductal dilatation.  CA 19-9 elevated at 166 2.  EUS showed 2.0 x 1.9 cm mass in the head of pancreas with no vessel involvement.  UT2N0.  Pathology showed adenocarcinoma 3.  CT chest showed 2 to 3 mm lung nodules with no evidence of distant metastatic disease.  In October 2021 patient underwent Whipple resection with pathology demonstrating 3.1 cm adenocarcinoma with mucinous features.  Uncinate margin positive for tumor.  Tumor approaches less than 1 mm within cautery.  Positive for PNI.  0 out of 18 lymph nodes positive for malignancy. 4.  Patient evaluated by medical oncology at [facility] and was recommended adjuvant chemotherapy followed by chemoradiation   Multiple adjuvant regimens including gem Abraxane, gemcitabine and Xeloda and gemcitabine single agent discussed with patient and patient decided to proceed with single agent gemcitabine starting 04/11/2020   Interval history-patient continues to endorse fatigue and back pain.  She is working with physical therapy, which started last week.  Her immediate goal is to feel stronger before her birthday and an upcoming wedding.  She otherwise denies significant changes or concerns today.  ECOG PS-  1 Pain scale- 0   Review of systems- Review of Systems  Constitutional:  Positive for malaise/fatigue. Negative for chills, fever and weight loss.  HENT:  Negative for congestion, ear discharge and nosebleeds.   Eyes:  Negative for blurred vision.  Respiratory:  Negative for cough, hemoptysis, sputum production, shortness of breath and wheezing.   Cardiovascular:  Negative for chest pain, palpitations, orthopnea and claudication.  Gastrointestinal:  Negative for abdominal pain, blood in stool, constipation, diarrhea, heartburn, melena, nausea and vomiting.  Genitourinary:  Negative for dysuria, flank pain, frequency, hematuria and urgency.  Musculoskeletal:  Negative for back pain, joint pain and myalgias.  Skin:  Negative for rash.  Neurological:  Negative for dizziness, tingling, focal weakness, seizures, weakness and headaches.  Endo/Heme/Allergies:  Does not bruise/bleed easily.  Psychiatric/Behavioral:  Negative for depression and suicidal ideas. The patient does not have insomnia.       Allergies  Allergen Reactions   Hydromorphone Hcl Itching   Azithromycin    Codeine Sulfate Other (See Comments)   Doxycycline Calcium Other (See Comments)   Duloxetine Nausea Only   Erythromycin Ethylsuccinate Other (See Comments)   Macrolides And Ketolides Other (See Comments)   Nitrofurantoin     Other reaction(s): Unknown Other reaction(s): Abdominal Pain Other reaction(s): Unknown   Sulfa Antibiotics      Past Medical History:  Diagnosis Date   Anxiety    Arthritis    Breast cancer ([place]) 1993   RT MASTECTOMY   Heartburn    HTN (hypertension)    Pancreatic cancer ([place])    Personal history of chemotherapy    S/P chemotherapy, time since  greater than 12 weeks 1993   BREAST CA     Past Surgical History:  Procedure Laterality Date   ABDOMINAL HYSTERECTOMY     colon polyp removal     GALLBLADDER SURGERY     LAPAROSCOPIC OOPHERECTOMY  1978   MASTECTOMY Right 1993   BREAST  CA   PANCREAS SURGERY N/A 01/04/2020   Per patient removed head of pancreas due to cancer   PORTA CATH INSERTION N/A 04/04/2020   Procedure: PORTA CATH INSERTION;  Surgeon: Algernon Huxley, MD;  Location: [facility];  Service: Cardiovascular;  Laterality: N/A;   removal of fallopian tubes  1989    Social History   Socioeconomic History   Marital status: Widowed    Spouse name: Not on file   Number of children: Not on file   Years of education: Not on file   Highest education level: Not on file  Occupational History   Not on file  Tobacco Use   Smoking status: Never   Smokeless tobacco: Never  Vaping Use   Vaping Use: Never used  Substance and Sexual Activity   Alcohol use: No   Drug use: No   Sexual activity: Not Currently  Other Topics Concern   Not on file  Social History Narrative   Not on file   Social Determinants of Health   Financial Resource Strain: Not on file  Food Insecurity: Not on file  Transportation Needs: Not on file  Physical Activity: Not on file  Stress: Not on file  Social Connections: Not on file  Intimate Partner Violence: Not on file    Family History  Problem Relation Age of Onset   Breast cancer Neg Hx    Kidney cancer Neg Hx    Bladder Cancer Neg Hx    Prostate cancer Neg Hx      Current Outpatient Medications:    amLODipine (NORVASC) 2.5 MG tablet, Take 2.5 mg by mouth daily. (Patient not taking: No sig reported), Disp: , Rfl:    aspirin EC 81 MG tablet, Take 81 mg by mouth daily., Disp: , Rfl:    atenolol (TENORMIN) 50 MG tablet, Take 50 mg by mouth 2 (two) times daily., Disp: , Rfl:    carboxymethylcellulose (REFRESH PLUS) 0.5 % SOLN, Apply to eye., Disp: , Rfl:    celecoxib (CELEBREX) 200 MG capsule, Take 200 mg by mouth daily., Disp: , Rfl:    cephALEXin (KEFLEX) 500 MG capsule, Take 500 mg by mouth 2 (two) times daily., Disp: , Rfl:    cetirizine (ZYRTEC) 10 MG tablet, Take by mouth., Disp: , Rfl:    clorazepate  (TRANXENE) 3.75 MG tablet, Take 3.75 mg by mouth daily as needed., Disp: , Rfl:    cyclobenzaprine (FLEXERIL) 10 MG tablet, TAKE 1 TABLET (10 MG TOTAL) BY MOUTH 3 (THREE) TIMES DAILY AS NEEDED FOR MUSCLE SPASMS. (Patient not taking: No sig reported), Disp: , Rfl:    gabapentin (NEURONTIN) 400 MG capsule, Take 400 mg by mouth 3 (three) times daily., Disp: , Rfl:    HYDROcodone-acetaminophen (NORCO) 5-325 MG tablet, Take 1 tablet by mouth every 4 (four) hours as needed for moderate pain., Disp: 45 tablet, Rfl: 0   Hydrocortisone Acetate (VAGISIL) 1 % CREA, Apply 1 application topically 3 (three) times daily as needed., Disp: 15 g, Rfl: 0   lidocaine-prilocaine (EMLA) cream, Apply small amount of cream over port site 1 1/2 hours before each treatment, place saran wrap over cream to protect clothin, Disp:  30 g, Rfl: 3   LORazepam (ATIVAN) 0.5 MG tablet, Take 1 tablet (0.5 mg total) by mouth every 6 (six) hours as needed (Nausea or vomiting)., Disp: 30 tablet, Rfl: 1   losartan (COZAAR) 100 MG tablet, Take 100 mg by mouth daily., Disp: , Rfl:    Multiple Vitamin (MULTI-VITAMINS) TABS, Take 1 tablet by mouth daily., Disp: , Rfl:    ondansetron (ZOFRAN) 8 MG tablet, TAKE 1 TABLET(8 MG) BY MOUTH TWICE DAILY AS NEEDED FOR NAUSEA OR VOMITING, Disp: 30 tablet, Rfl: 1   Pancrelipase, Lip-Prot-Amyl, 40000-126000 units CPEP, Take 1 Dose by mouth in the morning, at noon, in the evening, and at bedtime., Disp: , Rfl:    pantoprazole (PROTONIX) 40 MG tablet, Take 40 mg by mouth daily., Disp: , Rfl:    prochlorperazine (COMPAZINE) 10 MG tablet, Take 1 tablet (10 mg total) by mouth every 6 (six) hours as needed (Nausea or vomiting)., Disp: 30 tablet, Rfl: 1   traZODone (DESYREL) 100 MG tablet, Take 100 mg by mouth at bedtime., Disp: , Rfl:  No current facility-administered medications for this visit.  Facility-Administered Medications Ordered in Other Visits:    0.9 %  sodium chloride infusion, , Intravenous, Once,  Burns, Jennifer E, NP   0.9 %  sodium chloride infusion, , Intravenous, Once, Sindy Guadeloupe, MD   0.9 %  sodium chloride infusion, , Intravenous, Once, Sindy Guadeloupe, MD   alteplase (CATHFLO ACTIVASE) injection 2 mg, 2 mg, Intracatheter, Once PRN, Jacquelin Hawking, NP   heparin lock flush 100 unit/mL, 500 Units, Intracatheter, Once PRN, Sindy Guadeloupe, MD   heparin lock flush 100 unit/mL, 250 Units, Intracatheter, Once PRN, Jacquelin Hawking, NP   heparin lock flush 100 unit/mL, 500 Units, Intravenous, Once, Sindy Guadeloupe, MD   sodium chloride flush (NS) 0.9 % injection 10 mL, 10 mL, Intravenous, PRN, Sindy Guadeloupe, MD, 10 mL at 06/06/20 1039   sodium chloride flush (NS) 0.9 % injection 10 mL, 10 mL, Intracatheter, PRN, Sindy Guadeloupe, MD   sodium chloride flush (NS) 0.9 % injection 10 mL, 10 mL, Intracatheter, PRN, Faythe Casa E, NP, 10 mL at 06/20/20 1030   sodium chloride flush (NS) 0.9 % injection 10 mL, 10 mL, Intravenous, PRN, Faythe Casa E, NP, 10 mL at 08/12/20 1335   sodium chloride flush (NS) 0.9 % injection 10 mL, 10 mL, Intravenous, PRN, Dea Bitting, Vonna Kotyk R, NP, 10 mL at 09/02/20 1411   sodium chloride flush (NS) 0.9 % injection 3 mL, 3 mL, Intracatheter, PRN, Jacquelin Hawking, NP  Physical exam:  There were no vitals filed for this visit.  Physical Exam Constitutional:      General: She is not in acute distress. Cardiovascular:     Rate and Rhythm: Normal rate and regular rhythm.     Heart sounds: Normal heart sounds.  Pulmonary:     Effort: Pulmonary effort is normal.     Breath sounds: Normal breath sounds.  Abdominal:     General: Bowel sounds are normal.     Palpations: Abdomen is soft.  Skin:    General: Skin is warm and dry.  Neurological:     Mental Status: She is alert and oriented to person, place, and time.     CMP Latest Ref Rng & Units 09/13/2020  Glucose 70 - 99 mg/dL 131(H)  BUN 8 - 23 mg/dL 12  Creatinine 0.44 - 1.00 mg/dL 0.81  Sodium 135  - 145 mmol/L 134(L)  Potassium 3.5 - 5.1 mmol/L 4.3  Chloride 98 - 111 mmol/L 100  CO2 22 - 32 mmol/L 27  Calcium 8.9 - 10.3 mg/dL 8.6(L)  Total Protein 6.5 - 8.1 g/dL 6.4(L)  Total Bilirubin 0.3 - 1.2 mg/dL 0.8  Alkaline Phos 38 - 126 U/L 65  AST 15 - 41 U/L 28  ALT 0 - 44 U/L 12   CBC Latest Ref Rng & Units 09/13/2020  WBC 4.0 - 10.5 K/uL 6.2  Hemoglobin 12.0 - 15.0 g/dL 10.2(L)  Hematocrit 36.0 - 46.0 % 30.3(L)  Platelets 150 - 400 K/uL 285     Assessment and plan- Patient is a 80 y.o. female with pancreatic adenocarcinoma stage IB T2 N0 M0 s/p Whipple surgery.  She is here for on treatment assessment prior to cycle 9-day 1 of gemcitabine chemotherapy  Patient is without significant changes or concerns today.   Patient last saw Dr. Janese Banks on 09/13/2020 at which time decision was made to hold further adjuvant chemotherapy given poor tolerance.  Patient also did not wish to pursue radiation.  Plan is for continued surveillance and patient is scheduled for CT of the chest, abdomen, and pelvis on 11/12/2020 and then will see Dr. Janese Banks on 11/15/2020.  Patient had multiple questions about the benefit of contrast with CTs. Apparently, contrast has previously caused her some nausea. Discussed using antiemetics pre-imaging. However, I explained the contrast would provide the best quality imaging for surveilling her cancer.  Patient verbalized agreement with plan.  Patient continues to endorse persistent fatigue.  She is working with outpatient PT.  She would like to explore pharmacological options for fatigue.  Suggested that she could try American ginseng.    Visit Diagnosis 1. Pancreatic adenocarcinoma (HCC)      Altha Harm, PhD, NP-C [facility] [id number] 10/14/2020 11:06 AM

## 2020-10-15 ENCOUNTER — Ambulatory Visit: Payer: Medicare Other

## 2020-10-15 ENCOUNTER — Encounter: Payer: Self-pay | Admitting: Oncology

## 2020-10-15 ENCOUNTER — Telehealth: Payer: Self-pay | Admitting: Hospice and Palliative Medicine

## 2020-10-15 ENCOUNTER — Encounter: Payer: Self-pay | Admitting: Nurse Practitioner

## 2020-10-15 NOTE — Telephone Encounter (Signed)
Spoke with patient. She had questions about her upcoming CT. All questions answered to her verbalized satisfaction.

## 2020-10-15 NOTE — Telephone Encounter (Signed)
Pt left Vm that she needs to speak with you about a test. She didn't state what test, just a test. Pt was seen yesterday.

## 2020-10-15 NOTE — Progress Notes (Signed)
Nutrition Follow-up:  Patient with pancreatic cancer.  S/p whipple on 01/2020 at [facility].  Patient has completed adjuvant chemotherapy.   Spoke with patient via phone for follow-up.  Patient continues to feel weak.  Working with outpatient PT 2 times per week.  Patient reports that she has started drinking chocolate milk some.  Still eating "safe" foods.  Concerned about trying other foods.      Medications: reviewed  Labs: reviewed  Anthropometrics:   Weight 111 lb 9.6 oz 7/25  112 lb on 6/24 111 lb on 6/3 113 lb on 5/13 118 lb on 1/28 118 lb on 1/20 121 lb on 12/30   NUTRITION DIAGNOSIS: Inadequate oral intake continues   INTERVENTION:  Patient continues to eat "safe" foods. Encouraged her to increase portion size of these foods.  Recently tried Chick fil a peach milkshake (drank 1/2 then had to drink the other 1/2 later) 590 calories.  Encouraged her to get more often.   Patient wants to try taking nausea pill before drinking oral nutrition supplement as see how she does Continue to work with PT    MONITORING, EVALUATION, GOAL: weight trends, intake   NEXT VISIT: phone call on Tuesday, August 30th   Viviann Broyles B. Zenia Resides, [place], [place] Registered Dietitian [phone] (mobile)

## 2020-10-16 IMAGING — CT CT ABD-PELV W/ CM
2 of 5 series · 15 of 46 positions shown, 17 images · IV contrast (omnipaque)
Comparison: Chest x-ray 09/08/2016.

CLINICAL DATA: 79-year-old female with history of bladder pain for
the past 6 months with some nausea.

EXAM:
CT ABDOMEN AND PELVIS WITH CONTRAST
TECHNIQUE: Multidetector CT imaging of the abdomen and pelvis was performed
using the standard protocol following bolus administration of
intravenous contrast.
CONTRAST:  75mL OMNIPAQUE IOHEXOL 300 MG/ML  SOLN

[Series 2: abd pelvis 5.00 · axial · 0.73mm/px · z∈[-1550,-1140]mm · 12 of 92 slices shown, 14 images]
[im 5/92  soft-tissue]
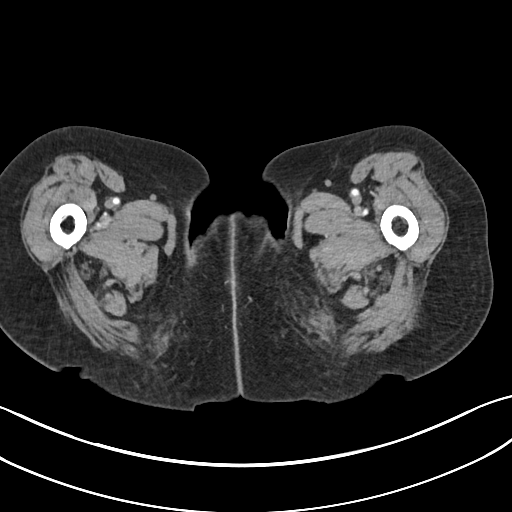
[im 5/92  bone]
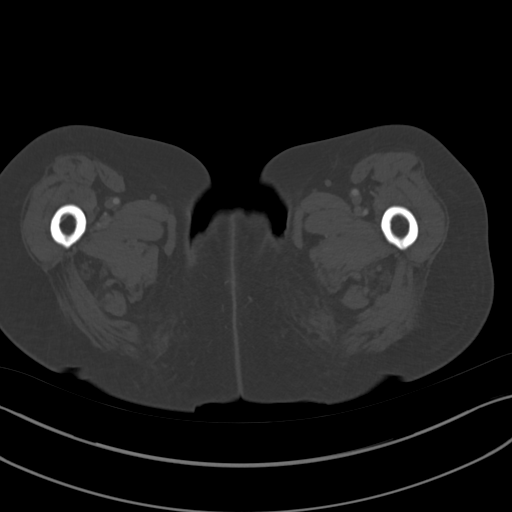
[im 14/92  soft-tissue]
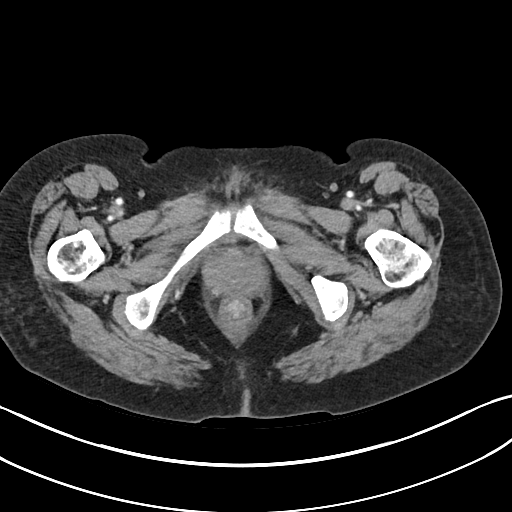
[im 19/92  soft-tissue]
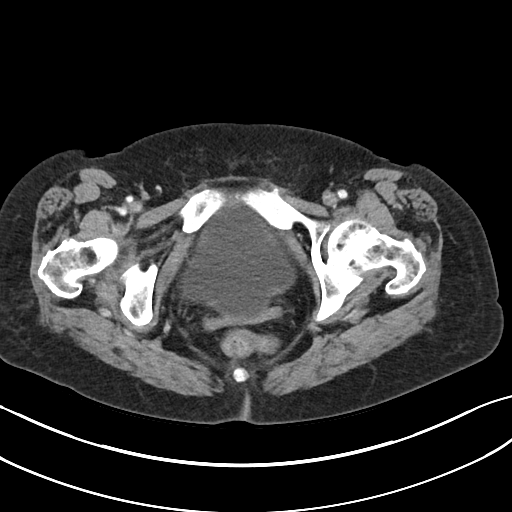
[im 28/92  soft-tissue]
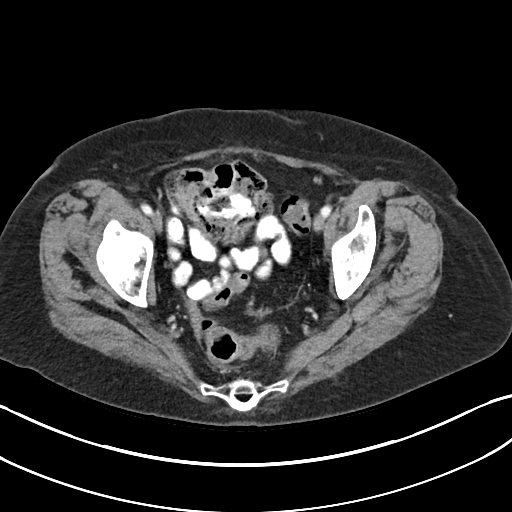
[im 37/92  soft-tissue]
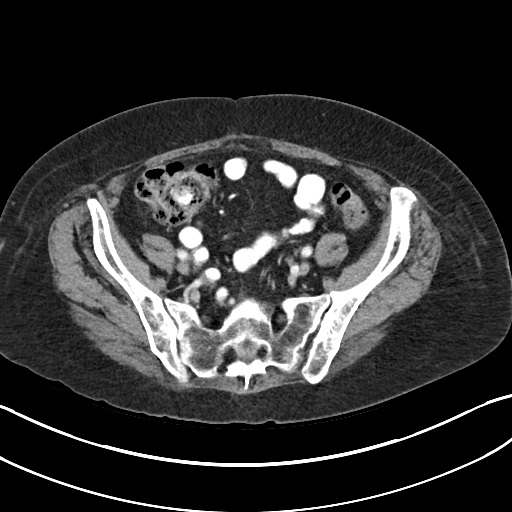
[im 41/92  soft-tissue]
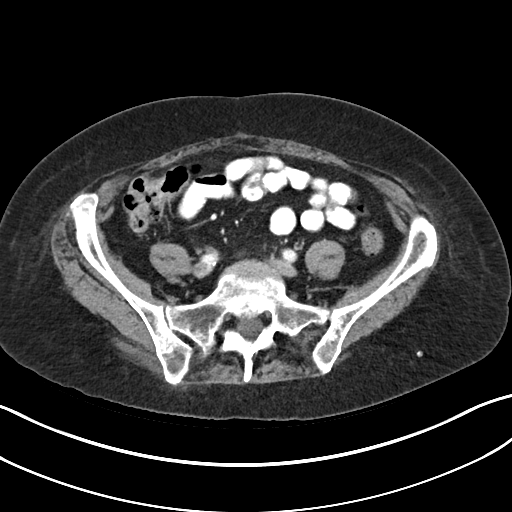
[im 51/92  soft-tissue]
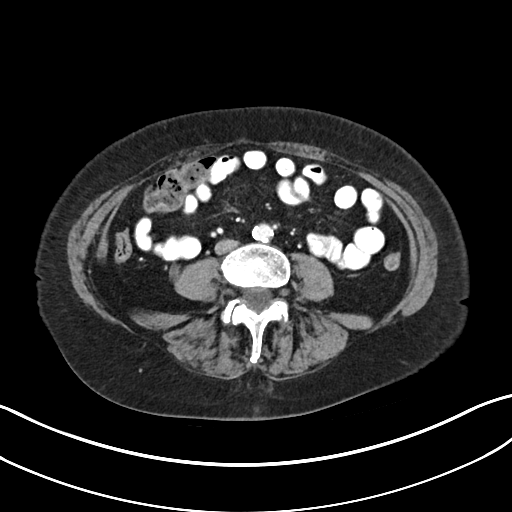
[im 55/92  soft-tissue]
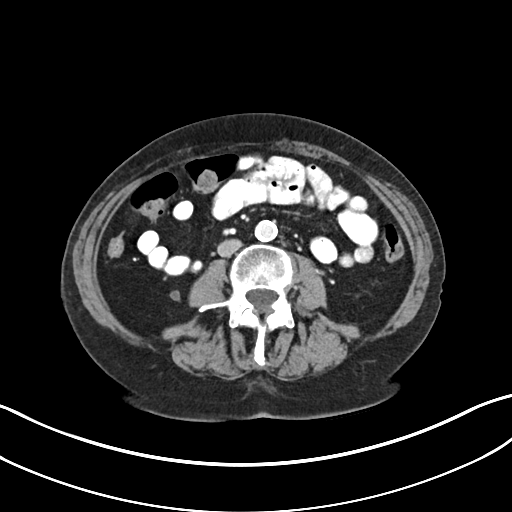
[im 64/92  soft-tissue]
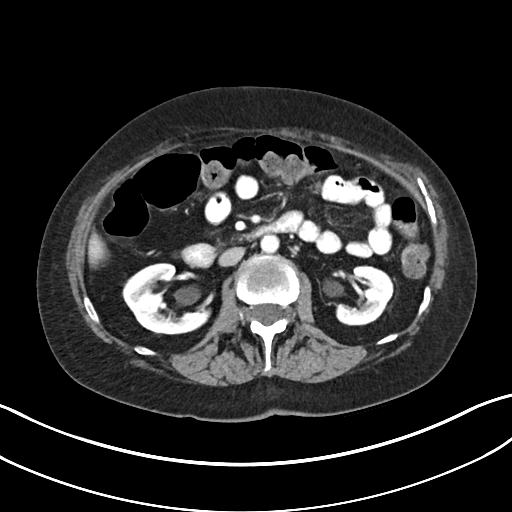
[im 64/92  bone]
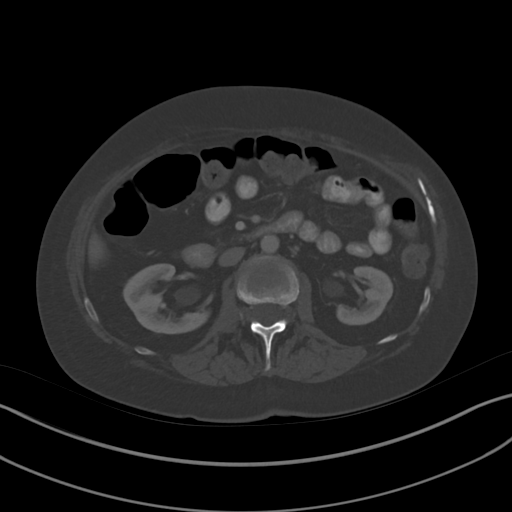
[im 73/92  soft-tissue]
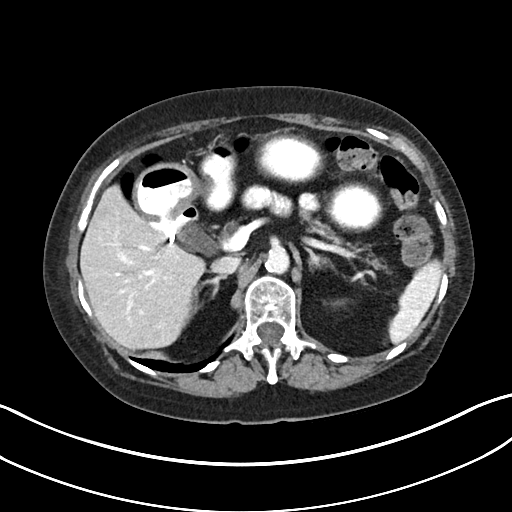
[im 78/92  soft-tissue]
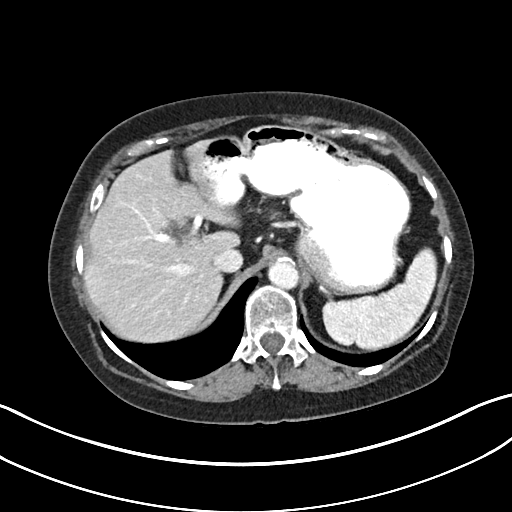
[im 87/92  soft-tissue]
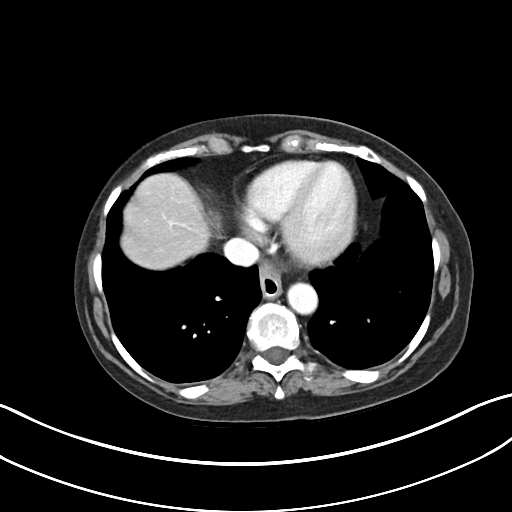

[Series 4: coronals abd pelvis 2.00 cor · coronal · 0.73mm/px · 3 of 118 slices shown]
[im 40/118  soft-tissue]
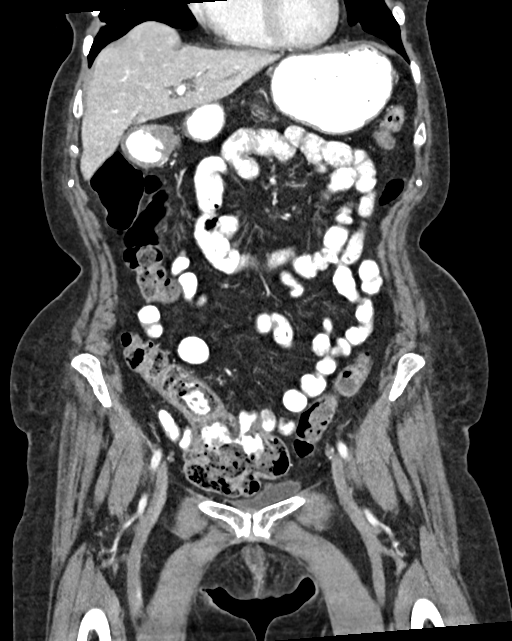
[im 53/118  soft-tissue]
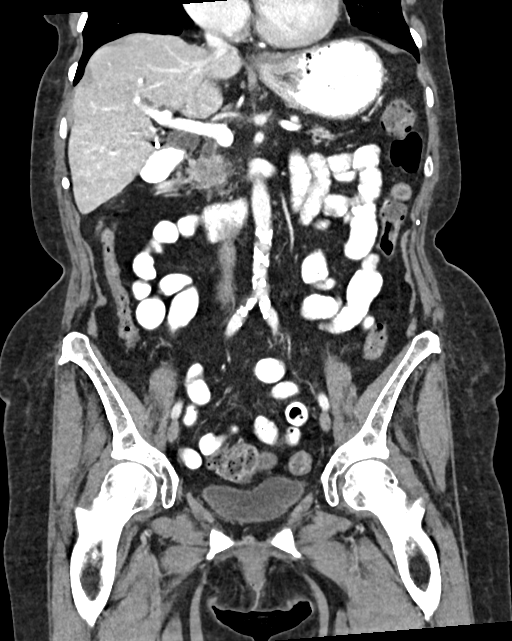
[im 66/118  soft-tissue]
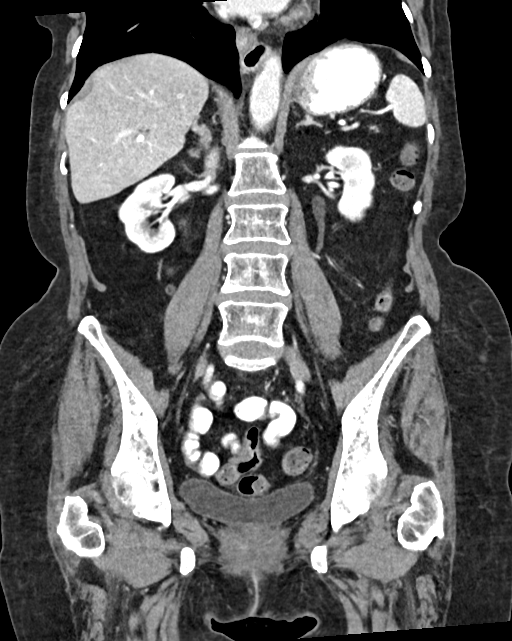

[15 of 46 positions shown; findings below may reference images not displayed]

FINDINGS: Lower chest: Small hiatal hernia.

Hepatobiliary: Diffuse low attenuation throughout the hepatic
parenchyma, suggestive of hepatic steatosis (difficult to confirm on
today's contrast enhanced examination). Status post cholecystectomy.
No intrahepatic biliary ductal dilatation. Common bile duct is
dilated measuring 13 mm in the porta hepatis.

Pancreas: New mass-like fullness in the region of the pancreatic
head measuring approximately 3.1 x 2.3 x 2.3 cm (axial image 25 of
series 2 and coronal image 53 of series 4) with obscuration of
normal intrapancreatic fat planes (seen on the prior examination).
This is associated with abrupt cut off of the main pancreatic duct
best appreciated on coronal image 52 of series 4, as well as diffuse
pancreatic ductal dilatation which measures up to 8 mm in the body
of the pancreas. Diffuse atrophy throughout the other portions of
the head, body and tail of the pancreas. No peripancreatic fluid
collections or inflammatory changes.

Spleen: Unremarkable.

Adrenals/Urinary Tract: Low-attenuation lesions in the left kidney
compatible with simple cysts, largest of which is in the medial
aspect of the lower pole measuring 2.5 cm in diameter. Right kidney
and bilateral adrenal glands are normal in appearance. No
hydroureteronephrosis. Urinary bladder is normal in appearance.

Stomach/Bowel: Normal appearance of the stomach. No pathologic
dilatation of small bowel or colon. The appendix is not confidently
identified and may be surgically absent. Regardless, there are no
inflammatory changes noted adjacent to the cecum to suggest the
presence of an acute appendicitis at this time.

Vascular/Lymphatic: Aortic atherosclerosis, without evidence of
aneurysm or dissection in the abdominal or pelvic vasculature.
Obscuration of the fat plane between the pancreatic head lesion and
the adjacent inferior vena cava. The pancreatic lesion is separate
from the superior mesenteric vein, splenoportal confluence and
portal vein. The lesion is well separated from the superior
mesenteric artery. No lymphadenopathy noted in the abdomen or
pelvis.

Reproductive: Status post hysterectomy. Ovaries are not confidently
identified may be surgically absent or atrophic.

Other: No significant volume of ascites.  No pneumoperitoneum.

Musculoskeletal: There are no aggressive appearing lytic or blastic
lesions noted in the visualized portions of the skeleton.
IMPRESSION: 1. New mass-like fullness in the region of the pancreatic head
measuring 3.1 x 2.3 x 2.3 cm with diffuse pancreatic ductal
dilatation and common bile duct dilatation concerning for primary
pancreatic neoplasm. Further evaluation with endoscopy and
endoscopic ultrasound is strongly recommended in the near future to
better evaluate this finding.
2. Probable hepatic steatosis.
3. Aortic atherosclerosis.
4. Small hiatal hernia.
5. Additional incidental findings, as above.

These results will be called to the ordering clinician or
representative by the Radiologist Assistant, and communication
documented in the PACS or [REDACTED].

## 2020-10-31 ENCOUNTER — Telehealth: Payer: Self-pay | Admitting: Oncology

## 2020-10-31 NOTE — Telephone Encounter (Signed)
Left VM with patient to return her call (she left a message at my [place] ext). Left [place] [facility] phone # where I can be reached today.

## 2020-11-05 ENCOUNTER — Ambulatory Visit: Payer: Medicare Other

## 2020-11-05 ENCOUNTER — Telehealth: Payer: Self-pay | Admitting: Hospice and Palliative Medicine

## 2020-11-05 NOTE — Telephone Encounter (Signed)
Patient left VM on triage line requesting a call back from [facility].   She has questions about the need for future CT scan and if she wants to have any further treatment.

## 2020-11-06 NOTE — Telephone Encounter (Signed)
I called and spoke with patient.  She had questions regarding the use of contrast for CTs.  Patient says that she is considering canceling her CT here and trying to get a CT scheduled at [facility] instead as she believes that they use a different type of contrast medium.  We discussed this at length and all questions were answered to her verbalized satisfaction.  Patient understands that it would be preferable to have a CT here to allow her medical oncologist to review the images.  Patient says she will let us know what she decides.

## 2020-11-08 ENCOUNTER — Other Ambulatory Visit: Payer: Medicare Other

## 2020-11-08 ENCOUNTER — Ambulatory Visit: Payer: Medicare Other | Admitting: Oncology

## 2020-11-12 ENCOUNTER — Ambulatory Visit: Payer: Medicare Other

## 2020-11-15 ENCOUNTER — Ambulatory Visit: Payer: Medicare Other | Admitting: Oncology

## 2020-11-15 ENCOUNTER — Other Ambulatory Visit: Payer: Medicare Other

## 2020-11-18 ENCOUNTER — Ambulatory Visit: Payer: Medicare Other | Admitting: Oncology

## 2020-11-18 ENCOUNTER — Other Ambulatory Visit: Payer: Medicare Other

## 2020-11-19 ENCOUNTER — Telehealth: Payer: Self-pay

## 2020-11-19 NOTE — Telephone Encounter (Signed)
Nutrition  Called patient for nutrition follow-up.  No answer. Left message on voicemail with call back number.  Sharlon Pfohl B. Zenia Resides, [place], [place] Registered Dietitian [phone] (mobile)

## 2020-11-21 ENCOUNTER — Other Ambulatory Visit: Payer: Self-pay

## 2020-11-21 ENCOUNTER — Ambulatory Visit
Admission: RE | Admit: 2020-11-21 | Discharge: 2020-11-21 | Disposition: A | Discharge status: Home / Self Care | Payer: Self-pay | Source: Ambulatory Visit | Attending: Oncology | Admitting: Oncology

## 2020-11-21 DIAGNOSIS — C259 Malignant neoplasm of pancreas, unspecified: Secondary | ICD-10-CM

## 2020-11-26 ENCOUNTER — Other Ambulatory Visit: Payer: Self-pay | Admitting: *Deleted

## 2020-11-26 ENCOUNTER — Other Ambulatory Visit: Payer: Self-pay

## 2020-11-26 ENCOUNTER — Inpatient Hospital Stay: Payer: Medicare Other

## 2020-11-26 ENCOUNTER — Encounter: Payer: Self-pay | Admitting: Oncology

## 2020-11-26 ENCOUNTER — Inpatient Hospital Stay: Payer: Medicare Other | Attending: Oncology | Admitting: Oncology

## 2020-11-26 VITALS — BP 144/58 | HR 57 | Temp 98.8°F | Resp 16 | Ht 63.0 in | Wt 111.8 lb

## 2020-11-26 DIAGNOSIS — G8929 Other chronic pain: Secondary | ICD-10-CM | POA: Insufficient documentation

## 2020-11-26 DIAGNOSIS — Z90710 Acquired absence of both cervix and uterus: Secondary | ICD-10-CM | POA: Diagnosis not present

## 2020-11-26 DIAGNOSIS — I10 Essential (primary) hypertension: Secondary | ICD-10-CM | POA: Diagnosis not present

## 2020-11-26 DIAGNOSIS — C250 Malignant neoplasm of head of pancreas: Secondary | ICD-10-CM

## 2020-11-26 DIAGNOSIS — Z95828 Presence of other vascular implants and grafts: Secondary | ICD-10-CM

## 2020-11-26 DIAGNOSIS — C259 Malignant neoplasm of pancreas, unspecified: Secondary | ICD-10-CM

## 2020-11-26 MED ORDER — HEPARIN SOD (PORK) LOCK FLUSH 100 UNIT/ML IV SOLN
500.0000 [IU] | Freq: Once | INTRAVENOUS | Status: AC
  Administered 2020-11-26: 500 [IU] via INTRAVENOUS
  Filled 2020-11-26: qty 5

## 2020-11-28 ENCOUNTER — Encounter: Payer: Self-pay | Admitting: Nurse Practitioner

## 2020-11-28 ENCOUNTER — Encounter: Payer: Self-pay | Admitting: Oncology

## 2020-11-28 NOTE — Progress Notes (Signed)
Hematology/Oncology Consult note [facility]  Telephone:([phone] [phone]  Patient Care Team: Baxter Hire, MD as PCP - General (Internal Medicine)   Name of the patient: Emily Harding  [id number]  1940/04/20   Date of visit: 11/28/20  Diagnosis- pancreatic adenocarcinoma stage Ib T2 N0 M0 s/p Whipple surgery and adjuvant chemotherapy  Chief complaint/ Reason for visit-routine follow-up of pancreatic cancer to discuss CT scan results and further management  Heme/Onc history: patient is a 80 year old female with an oncology history as follows: 1.  On 11/22/2019 she presented with left lower quadrant abdominal pain and persistent nausea.  CT abdomen and pelvis showed a 3.1 x 2.3 cm mass in the head of the pancreas with diffuse pancreatic ductal dilatation.  CA 19-9 elevated at 166 2.  EUS showed 2.0 x 1.9 cm mass in the head of pancreas with no vessel involvement.  UT2N0.  Pathology showed adenocarcinoma 3.  CT chest showed 2 to 3 mm lung nodules with no evidence of distant metastatic disease.  In October 2021 patient underwent Whipple resection with pathology demonstrating 3.1 cm adenocarcinoma with mucinous features.  Uncinate margin positive for tumor.  Tumor approaches less than 1 mm within cautery.  Positive for PNI.  0 out of 18 lymph nodes positive for malignancy. 4.  Patient evaluated by medical oncology at [facility] and was recommended adjuvant chemotherapy followed by chemoradiation   Multiple adjuvant regimens including gem Abraxane, gemcitabine and Xeloda and gemcitabine single agent discussed with patient and patient decided to proceed with single agent gemcitabine starting 04/11/2020.  She completed adjuvant chemotherapy in June 2022.  Interval history-patient reports some improvement in her energy levels after stopping chemotherapy.  She has occasional nausea.  Otherwise her appetite is good and she denies any significant vomiting.  Denies any  changes in her bowel habits.  She has chronic back pain  ECOG PS- 1 Pain scale- 5 Opioid associated constipation- no  Review of systems- Review of Systems  Constitutional:  Positive for malaise/fatigue. Negative for chills, fever and weight loss.  HENT:  Negative for congestion, ear discharge and nosebleeds.   Eyes:  Negative for blurred vision.  Respiratory:  Negative for cough, hemoptysis, sputum production, shortness of breath and wheezing.   Cardiovascular:  Negative for chest pain, palpitations, orthopnea and claudication.  Gastrointestinal:  Negative for abdominal pain, blood in stool, constipation, diarrhea, heartburn, melena, nausea and vomiting.  Genitourinary:  Negative for dysuria, flank pain, frequency, hematuria and urgency.  Musculoskeletal:  Positive for back pain. Negative for joint pain and myalgias.  Skin:  Negative for rash.  Neurological:  Negative for dizziness, tingling, focal weakness, seizures, weakness and headaches.  Endo/Heme/Allergies:  Does not bruise/bleed easily.  Psychiatric/Behavioral:  Negative for depression and suicidal ideas. The patient does not have insomnia.      Allergies  Allergen Reactions   Hydromorphone Hcl Itching   Azithromycin    Codeine Sulfate Other (See Comments)   Doxycycline Calcium Other (See Comments)   Duloxetine Nausea Only   Erythromycin Ethylsuccinate Other (See Comments)   Macrolides And Ketolides Other (See Comments)   Nitrofurantoin     Other reaction(s): Unknown Other reaction(s): Abdominal Pain Other reaction(s): Unknown   Sulfa Antibiotics      Past Medical History:  Diagnosis Date   Anxiety    Arthritis    Breast cancer ([place]) 1993   RT MASTECTOMY   Heartburn    HTN (hypertension)    Pancreatic cancer ([place])  Personal history of chemotherapy    S/P chemotherapy, time since greater than 12 weeks 1993   BREAST CA     Past Surgical History:  Procedure Laterality Date   ABDOMINAL HYSTERECTOMY      colon polyp removal     GALLBLADDER SURGERY     LAPAROSCOPIC OOPHERECTOMY  1978   MASTECTOMY Right 1993   BREAST CA   PANCREAS SURGERY N/A 01/04/2020   Per patient removed head of pancreas due to cancer   PORTA CATH INSERTION N/A 04/04/2020   Procedure: PORTA CATH INSERTION;  Surgeon: Algernon Huxley, MD;  Location: [facility];  Service: Cardiovascular;  Laterality: N/A;   removal of fallopian tubes  1989    Social History   Socioeconomic History   Marital status: Widowed    Spouse name: Not on file   Number of children: Not on file   Years of education: Not on file   Highest education level: Not on file  Occupational History   Not on file  Tobacco Use   Smoking status: Never   Smokeless tobacco: Never  Vaping Use   Vaping Use: Never used  Substance and Sexual Activity   Alcohol use: No   Drug use: No   Sexual activity: Not Currently  Other Topics Concern   Not on file  Social History Narrative   Not on file   Social Determinants of Health   Financial Resource Strain: Not on file  Food Insecurity: Not on file  Transportation Needs: Not on file  Physical Activity: Not on file  Stress: Not on file  Social Connections: Not on file  Intimate Partner Violence: Not on file    Family History  Problem Relation Age of Onset   Breast cancer Neg Hx    Kidney cancer Neg Hx    Bladder Cancer Neg Hx    Prostate cancer Neg Hx      Current Outpatient Medications:    aspirin EC 81 MG tablet, Take 81 mg by mouth daily., Disp: , Rfl:    atenolol (TENORMIN) 50 MG tablet, Take 50 mg by mouth 2 (two) times daily., Disp: , Rfl:    celecoxib (CELEBREX) 200 MG capsule, Take 200 mg by mouth daily., Disp: , Rfl:    cetirizine (ZYRTEC) 10 MG tablet, Take 10 mg by mouth daily as needed., Disp: , Rfl:    clorazepate (TRANXENE) 3.75 MG tablet, Take 3.75 mg by mouth daily as needed., Disp: , Rfl:    esomeprazole (NEXIUM) 40 MG capsule, Take 40 mg by mouth daily at 12 noon., Disp:  , Rfl:    gabapentin (NEURONTIN) 400 MG capsule, Take 400 mg by mouth 3 (three) times daily., Disp: , Rfl:    Hydrocortisone Acetate (VAGISIL) 1 % CREA, Apply 1 application topically 3 (three) times daily as needed., Disp: 15 g, Rfl: 0   LORazepam (ATIVAN) 0.5 MG tablet, Take 1 tablet (0.5 mg total) by mouth every 6 (six) hours as needed (Nausea or vomiting)., Disp: 30 tablet, Rfl: 1   losartan (COZAAR) 100 MG tablet, Take 100 mg by mouth daily., Disp: , Rfl:    Multiple Vitamin (MULTI-VITAMINS) TABS, Take 1 tablet by mouth daily., Disp: , Rfl:    ondansetron (ZOFRAN) 8 MG tablet, TAKE 1 TABLET(8 MG) BY MOUTH TWICE DAILY AS NEEDED FOR NAUSEA OR VOMITING, Disp: 30 tablet, Rfl: 1   Pancrelipase, Lip-Prot-Amyl, 40000-126000 units CPEP, Take 1 Dose by mouth in the morning, at noon, in the evening, and at  bedtime., Disp: , Rfl:    traZODone (DESYREL) 100 MG tablet, Take 100 mg by mouth at bedtime., Disp: , Rfl:    amLODipine (NORVASC) 2.5 MG tablet, Take 2.5 mg by mouth daily. (Patient not taking: No sig reported), Disp: , Rfl:    cyclobenzaprine (FLEXERIL) 10 MG tablet, TAKE 1 TABLET (10 MG TOTAL) BY MOUTH 3 (THREE) TIMES DAILY AS NEEDED FOR MUSCLE SPASMS. (Patient not taking: No sig reported), Disp: , Rfl:    HYDROcodone-acetaminophen (NORCO) 5-325 MG tablet, Take 1 tablet by mouth every 4 (four) hours as needed for moderate pain. (Patient not taking: Reported on 11/26/2020), Disp: 45 tablet, Rfl: 0   lidocaine-prilocaine (EMLA) cream, Apply small amount of cream over port site 1 1/2 hours before each treatment, place saran wrap over cream to protect clothin (Patient not taking: Reported on 11/26/2020), Disp: 30 g, Rfl: 3   prochlorperazine (COMPAZINE) 10 MG tablet, Take 1 tablet (10 mg total) by mouth every 6 (six) hours as needed (Nausea or vomiting). (Patient not taking: Reported on 11/26/2020), Disp: 30 tablet, Rfl: 1 No current facility-administered medications for this visit.  Facility-Administered  Medications Ordered in Other Visits:    0.9 %  sodium chloride infusion, , Intravenous, Once, Burns, Jennifer E, NP   0.9 %  sodium chloride infusion, , Intravenous, Once, Sindy Guadeloupe, MD   0.9 %  sodium chloride infusion, , Intravenous, Once, Sindy Guadeloupe, MD   alteplase (CATHFLO ACTIVASE) injection 2 mg, 2 mg, Intracatheter, Once PRN, Jacquelin Hawking, NP   heparin lock flush 100 unit/mL, 500 Units, Intracatheter, Once PRN, Sindy Guadeloupe, MD   heparin lock flush 100 unit/mL, 250 Units, Intracatheter, Once PRN, Jacquelin Hawking, NP   heparin lock flush 100 unit/mL, 500 Units, Intravenous, Once, Sindy Guadeloupe, MD   sodium chloride flush (NS) 0.9 % injection 10 mL, 10 mL, Intravenous, PRN, Sindy Guadeloupe, MD, 10 mL at 06/06/20 1039   sodium chloride flush (NS) 0.9 % injection 10 mL, 10 mL, Intracatheter, PRN, Sindy Guadeloupe, MD   sodium chloride flush (NS) 0.9 % injection 10 mL, 10 mL, Intracatheter, PRN, Faythe Casa E, NP, 10 mL at 06/20/20 1030   sodium chloride flush (NS) 0.9 % injection 10 mL, 10 mL, Intravenous, PRN, Faythe Casa E, NP, 10 mL at 08/12/20 1335   sodium chloride flush (NS) 0.9 % injection 10 mL, 10 mL, Intravenous, PRN, Borders, Vonna Kotyk R, NP, 10 mL at 09/02/20 1411   sodium chloride flush (NS) 0.9 % injection 3 mL, 3 mL, Intracatheter, PRN, Jacquelin Hawking, NP  Physical exam:  Vitals:   11/26/20 1411  BP: (!) 144/58  Pulse: (!) 57  Resp: 16  Temp: 98.8 F (37.1 C)  TempSrc: Tympanic  Weight: 111 lb 12.8 oz (50.7 kg)  Height: '5\' 3"'$  (1.6 m)   Physical Exam Constitutional:      General: She is not in acute distress. Cardiovascular:     Rate and Rhythm: Normal rate and regular rhythm.     Heart sounds: Normal heart sounds.  Pulmonary:     Effort: Pulmonary effort is normal.     Breath sounds: Normal breath sounds.  Abdominal:     General: Bowel sounds are normal.     Palpations: Abdomen is soft.  Skin:    General: Skin is warm and dry.   Neurological:     Mental Status: She is alert and oriented to person, place, and time.     CMP  Latest Ref Rng & Units 10/14/2020  Glucose 70 - 99 mg/dL 110(H)  BUN 8 - 23 mg/dL 16  Creatinine 0.44 - 1.00 mg/dL 0.93  Sodium 135 - 145 mmol/L 133(L)  Potassium 3.5 - 5.1 mmol/L 4.2  Chloride 98 - 111 mmol/L 97(L)  CO2 22 - 32 mmol/L 29  Calcium 8.9 - 10.3 mg/dL 9.0  Total Protein 6.5 - 8.1 g/dL 7.0  Total Bilirubin 0.3 - 1.2 mg/dL 0.8  Alkaline Phos 38 - 126 U/L 60  AST 15 - 41 U/L 28  ALT 0 - 44 U/L 16   CBC Latest Ref Rng & Units 10/14/2020  WBC 4.0 - 10.5 K/uL 7.3  Hemoglobin 12.0 - 15.0 g/dL 12.6  Hematocrit 36.0 - 46.0 % 38.5  Platelets 150 - 400 K/uL 259     Assessment and plan- Patient is a 80 y.o. female with pancreatic adenocarcinoma stage IB T2 N0 M0 s/p Whipple surgery and adjuvant chemotherapy ending in June 2022.  This is a routine follow-up visit to discuss CT scan results and further management  I have reviewed CT chest abdomen pelvis images that were done at [facility].  Scans from November 17, 2020 did not show any evidence of recurrent or progressive disease.  There was a focal small segment intussusception of the jejunum noted into the stomach at the site of GJ anastomosis resulting in moderate gastric dilatation.  However patient is relatively asymptomatic at this time and has been able to eat her meals regularly without any significant vomiting or bloating.  Bowel movements are regular.  Continue to monitor clinically.  Patient desires to get her CT scans at [facility] since she gets it done without contrast and will be getting her next scan in December 2022 following which she will be seen by covering NP at that point.  I will see her roughly in March 2023  Chronic low back pain: This is not related to her malignancy.  Patient will need to discuss this further with Dr. Edwina Barth or be referred to pain clinic.   Visit Diagnosis 1. Malignant neoplasm of head of pancreas [facility])       Dr. Randa Evens, MD, MPH [facility] [id number] 11/28/2020 10:28 AM

## 2020-12-17 ENCOUNTER — Inpatient Hospital Stay: Payer: Medicare Other

## 2020-12-17 NOTE — Progress Notes (Signed)
Nutrition Follow-up:  Patient with pancreatic cancer. Most recent CT no recurrence or progressive disease per MD note.  Patient has completed treatment.  Called patient for nutrition follow-up.  Patient reports that she is still eating about the same amount.  Drinks ensure plus every now and then.  Just drank a shake prior to RD call and she is waiting to see if it makes her sick.  Says that she is not gaining any weight.    Was able to attend grand-son's wedding in August which she was grateful for.      Medications: reviewed  Labs: reviewed  Anthropometrics:   [facility] at [facility] 109 lb 9/15 111 lb on 7/25  NUTRITION DIAGNOSIS: Inadequate oral intake continues   INTERVENTION:  Patient continues to eat "safe foods".  Encouraged patient to increase portion size.   Continue oral nutrition supplement as able. Appetite stimulant maybe option for patient if weight decreases.   RD available as needed. Patient has contact information   NEXT VISIT: no follow-up  Emily Harding B. Zenia Resides, [place], [place] Registered Dietitian [phone] (mobile)

## 2021-01-06 ENCOUNTER — Telehealth: Payer: Self-pay | Admitting: *Deleted

## 2021-01-06 DIAGNOSIS — C250 Malignant neoplasm of head of pancreas: Secondary | ICD-10-CM

## 2021-01-06 MED ORDER — LORAZEPAM 0.5 MG PO TABS: 0.5000 mg | ORAL_TABLET | Freq: Four times a day (QID) | ORAL | 1 refills | Status: AC | PRN

## 2021-01-06 NOTE — Telephone Encounter (Signed)
Josh can you please call her ?  We can hold her lorazepam although I doubt that that is what is causing her syncopal episodes.  I did want to switch her to another benzodiazepine as it would cause similar side effect if that is the culprit.  She should speak to Dr. Edwina Barth and get syncope evaluation preferably.

## 2021-01-06 NOTE — Telephone Encounter (Signed)
I spoke with patient by phone.  She reports that she has been told she has bundle branch block and is seeing cardiology on Wednesday.  She had thought about weaning off the lorazepam as she knows that this can be habit-forming but says that she has only taking at bedtime and that it significantly helps improve her sleep.  After discussing alternatives, patient requested to stay on the lorazepam for now.  We will send a refill.

## 2021-01-06 NOTE — Telephone Encounter (Signed)
Patient called asking to speak with you about her medications stating she passed out at church yesterday. She thinks it is the Lorazepam and said that Josh told her that Dr Janese Banks may want her to go on something else

## 2021-02-21 ENCOUNTER — Encounter: Payer: Self-pay | Admitting: Oncology

## 2021-02-21 ENCOUNTER — Encounter: Payer: Self-pay | Admitting: Nurse Practitioner

## 2021-02-21 NOTE — Telephone Encounter (Signed)
See previous note from 10/28/20, signing note

## 2021-03-11 ENCOUNTER — Inpatient Hospital Stay (HOSPITAL_BASED_OUTPATIENT_CLINIC_OR_DEPARTMENT_OTHER): Payer: Medicare Other | Admitting: Hospice and Palliative Medicine

## 2021-03-11 ENCOUNTER — Other Ambulatory Visit: Payer: Self-pay

## 2021-03-11 ENCOUNTER — Inpatient Hospital Stay: Payer: Medicare Other | Attending: Hospice and Palliative Medicine

## 2021-03-11 VITALS — BP 145/56 | HR 57 | Temp 97.6°F | Resp 18

## 2021-03-11 DIAGNOSIS — M545 Low back pain, unspecified: Secondary | ICD-10-CM | POA: Insufficient documentation

## 2021-03-11 DIAGNOSIS — I10 Essential (primary) hypertension: Secondary | ICD-10-CM | POA: Insufficient documentation

## 2021-03-11 DIAGNOSIS — Z90710 Acquired absence of both cervix and uterus: Secondary | ICD-10-CM | POA: Insufficient documentation

## 2021-03-11 DIAGNOSIS — R5383 Other fatigue: Secondary | ICD-10-CM | POA: Diagnosis not present

## 2021-03-11 DIAGNOSIS — C250 Malignant neoplasm of head of pancreas: Secondary | ICD-10-CM | POA: Insufficient documentation

## 2021-03-11 DIAGNOSIS — Z95828 Presence of other vascular implants and grafts: Secondary | ICD-10-CM

## 2021-03-11 LAB — COMPREHENSIVE METABOLIC PANEL
ALT: 14 U/L (ref 0–44)
AST: 23 U/L (ref 15–41)
Albumin: 3.9 g/dL (ref 3.5–5.0)
Alkaline Phosphatase: 83 U/L (ref 38–126)
Anion gap: 8 (ref 5–15)
BUN: 21 mg/dL (ref 8–23)
CO2: 27 mmol/L (ref 22–32)
Calcium: 8.9 mg/dL (ref 8.9–10.3)
Chloride: 96 mmol/L — ABNORMAL LOW (ref 98–111)
Creatinine, Ser: 0.78 mg/dL (ref 0.44–1.00)
GFR, Estimated: >60 mL/min (ref >60)
Glucose, Bld: 119 mg/dL — ABNORMAL HIGH (ref 70–99)
Potassium: 4.4 mmol/L (ref 3.5–5.1)
Sodium: 131 mmol/L — ABNORMAL LOW (ref 135–145)
Total Bilirubin: 0.7 mg/dL (ref 0.3–1.2)
Total Protein: 6.8 g/dL (ref 6.5–8.1)

## 2021-03-11 LAB — CBC WITH DIFFERENTIAL/PLATELET
Abs Immature Granulocytes: 0.02 10*3/uL (ref 0.00–0.07)
Basophils Absolute: 0 10*3/uL (ref 0.0–0.1)
Basophils Relative: 0 %
Eosinophils Absolute: 0.2 10*3/uL (ref 0.0–0.5)
Eosinophils Relative: 3 %
HCT: 36.7 % (ref 36.0–46.0)
Hemoglobin: 12.7 g/dL (ref 12.0–15.0)
Immature Granulocytes: 0 %
Lymphocytes Relative: 12 %
Lymphs Abs: 0.9 10*3/uL (ref 0.7–4.0)
MCH: 32.5 pg (ref 26.0–34.0)
MCHC: 34.6 g/dL (ref 30.0–36.0)
MCV: 93.9 fL (ref 80.0–100.0)
Monocytes Absolute: 0.4 10*3/uL (ref 0.1–1.0)
Monocytes Relative: 6 %
Neutro Abs: 5.6 10*3/uL (ref 1.7–7.7)
Neutrophils Relative %: 79 %
Platelets: 275 10*3/uL (ref 150–400)
RBC: 3.91 MIL/uL (ref 3.87–5.11)
RDW: 12.5 % (ref 11.5–15.5)
WBC: 7.1 10*3/uL (ref 4.0–10.5)
nRBC: 0 % (ref 0.0–0.2)

## 2021-03-11 MED ORDER — HEPARIN SOD (PORK) LOCK FLUSH 100 UNIT/ML IV SOLN
500.0000 [IU] | Freq: Once | INTRAVENOUS | Status: AC
  Administered 2021-03-11: 14:00:00 500 [IU] via INTRAVENOUS
  Filled 2021-03-11: qty 5

## 2021-03-11 MED ORDER — SODIUM CHLORIDE 0.9% FLUSH
10.0000 mL | Freq: Once | INTRAVENOUS | Status: AC
  Administered 2021-03-11: 14:00:00 10 mL via INTRAVENOUS
  Filled 2021-03-11: qty 10

## 2021-03-11 NOTE — Progress Notes (Signed)
Hematology/Oncology Consult note [place] at [facility] [phone] [phone]  Patient Care Team: Baxter Hire, MD as PCP - General (Internal Medicine)   Name of the patient: Emily Harding  [id number]  [birth year]   Date of visit: 03/11/21  Diagnosis- pancreatic adenocarcinoma stage Ib T2 N0 M0 s/p Whipple surgery and adjuvant chemotherapy  Chief complaint/ Reason for visit-routine follow-up of pancreatic cancer to discuss CT scan results and further management  Heme/Onc history: patient is a 80 year old female with an oncology history as follows: 1.  On 11/22/2019 she presented with left lower quadrant abdominal pain and persistent nausea.  CT abdomen and pelvis showed a 3.1 x 2.3 cm mass in the head of the pancreas with diffuse pancreatic ductal dilatation.  CA 19-9 elevated at 166 2.  EUS showed 2.0 x 1.9 cm mass in the head of pancreas with no vessel involvement.  UT2N0.  Pathology showed adenocarcinoma 3.  CT chest showed 2 to 3 mm lung nodules with no evidence of distant metastatic disease.  In October 2021 patient underwent Whipple resection with pathology demonstrating 3.1 cm adenocarcinoma with mucinous features.  Uncinate margin positive for tumor.  Tumor approaches less than 1 mm within cautery.  Positive for PNI.  0 out of 18 lymph nodes positive for malignancy. 4.  Patient evaluated by medical oncology at [facility] and was recommended adjuvant chemotherapy followed by chemoradiation   Multiple adjuvant regimens including gem Abraxane, gemcitabine and Xeloda and gemcitabine single agent discussed with patient and patient decided to proceed with single agent gemcitabine starting 04/11/2020.  She completed adjuvant chemotherapy in June 2022.  CT November 17, 2020 did not show any evidence of recurrent or progressive disease.  There was a focal small segment intussusception of the jejunum noted into the stomach at the site of GJ anastomosis  resulting in moderate gastric dilatation.  Interval history-patient reports that she is doing reasonably well.  She denies significant changes or concerns.  She does continue to endorse chronic fatigue and low back pain.  She has stable performance status.  Appetite is fair.  Patient had recent follow-up by [facility] oncology.  ECOG PS- 1 Pain scale- 5 Opioid associated constipation- no  Review of systems- Review of Systems  Constitutional:  Positive for malaise/fatigue. Negative for chills, fever and weight loss.  HENT:  Negative for congestion, ear discharge and nosebleeds.   Eyes:  Negative for blurred vision.  Respiratory:  Negative for cough, hemoptysis, sputum production, shortness of breath and wheezing.   Cardiovascular:  Negative for chest pain, palpitations, orthopnea and claudication.  Gastrointestinal:  Negative for abdominal pain, blood in stool, constipation, diarrhea, heartburn, melena, nausea and vomiting.  Genitourinary:  Negative for dysuria, flank pain, frequency, hematuria and urgency.  Musculoskeletal:  Positive for back pain. Negative for joint pain and myalgias.  Skin:  Negative for rash.  Neurological:  Negative for dizziness, tingling, focal weakness, seizures, weakness and headaches.  Endo/Heme/Allergies:  Does not bruise/bleed easily.  Psychiatric/Behavioral:  Negative for depression and suicidal ideas. The patient does not have insomnia.      Allergies  Allergen Reactions   Hydromorphone Hcl Itching   Azithromycin    Codeine Sulfate Other (See Comments)   Doxycycline Calcium Other (See Comments)   Duloxetine Nausea Only   Erythromycin Ethylsuccinate Other (See Comments)   Macrolides And Ketolides Other (See Comments)   Nitrofurantoin     Other reaction(s): Unknown Other reaction(s): Abdominal Pain Other reaction(s): Unknown   Sulfa  Antibiotics      Past Medical History:  Diagnosis Date   Anxiety    Arthritis    Breast cancer ([place]) 1993   RT  MASTECTOMY   Heartburn    HTN (hypertension)    Pancreatic cancer ([place])    Personal history of chemotherapy    S/P chemotherapy, time since greater than 12 weeks 1993   BREAST CA     Past Surgical History:  Procedure Laterality Date   ABDOMINAL HYSTERECTOMY     colon polyp removal     GALLBLADDER SURGERY     LAPAROSCOPIC OOPHERECTOMY  1978   MASTECTOMY Right 1993   BREAST CA   PANCREAS SURGERY N/A 01/04/2020   Per patient removed head of pancreas due to cancer   PORTA CATH INSERTION N/A 04/04/2020   Procedure: PORTA CATH INSERTION;  Surgeon: Algernon Huxley, MD;  Location: [facility] [facility];  Service: Cardiovascular;  Laterality: N/A;   removal of fallopian tubes  1989    Social History   Socioeconomic History   Marital status: Widowed    Spouse name: Not on file   Number of children: Not on file   Years of education: Not on file   Highest education level: Not on file  Occupational History   Not on file  Tobacco Use   Smoking status: Never   Smokeless tobacco: Never  Vaping Use   Vaping Use: Never used  Substance and Sexual Activity   Alcohol use: No   Drug use: No   Sexual activity: Not Currently  Other Topics Concern   Not on file  Social History Narrative   Not on file   Social Determinants of Health   Financial Resource Strain: Not on file  Food Insecurity: Not on file  Transportation Needs: Not on file  Physical Activity: Not on file  Stress: Not on file  Social Connections: Not on file  Intimate Partner Violence: Not on file    Family History  Problem Relation Age of Onset   Breast cancer Neg Hx    Kidney cancer Neg Hx    Bladder Cancer Neg Hx    Prostate cancer Neg Hx      Current Outpatient Medications:    amLODipine (NORVASC) 2.5 MG tablet, Take 2.5 mg by mouth daily. (Patient not taking: No sig reported), Disp: , Rfl:    aspirin EC 81 MG tablet, Take 81 mg by mouth daily., Disp: , Rfl:    atenolol (TENORMIN) 50 MG tablet, Take 50 mg by  mouth 2 (two) times daily., Disp: , Rfl:    celecoxib (CELEBREX) 200 MG capsule, Take 200 mg by mouth daily., Disp: , Rfl:    cetirizine (ZYRTEC) 10 MG tablet, Take 10 mg by mouth daily as needed., Disp: , Rfl:    cyclobenzaprine (FLEXERIL) 10 MG tablet, TAKE 1 TABLET (10 MG TOTAL) BY MOUTH 3 (THREE) TIMES DAILY AS NEEDED FOR MUSCLE SPASMS. (Patient not taking: No sig reported), Disp: , Rfl:    esomeprazole (NEXIUM) 40 MG capsule, Take 40 mg by mouth daily at 12 noon., Disp: , Rfl:    gabapentin (NEURONTIN) 400 MG capsule, Take 400 mg by mouth 3 (three) times daily., Disp: , Rfl:    HYDROcodone-acetaminophen (NORCO) 5-325 MG tablet, Take 1 tablet by mouth every 4 (four) hours as needed for moderate pain. (Patient not taking: Reported on 11/26/2020), Disp: 45 tablet, Rfl: 0   Hydrocortisone Acetate (VAGISIL) 1 % CREA, Apply 1 application topically 3 (three) times daily  as needed., Disp: 15 g, Rfl: 0   lidocaine-prilocaine (EMLA) cream, Apply small amount of cream over port site 1 1/2 hours before each treatment, place saran wrap over cream to protect clothin (Patient not taking: Reported on 11/26/2020), Disp: 30 g, Rfl: 3   LORazepam (ATIVAN) 0.5 MG tablet, Take 1 tablet (0.5 mg total) by mouth every 6 (six) hours as needed (Nausea or vomiting)., Disp: 30 tablet, Rfl: 1   losartan (COZAAR) 100 MG tablet, Take 100 mg by mouth daily., Disp: , Rfl:    Multiple Vitamin (MULTI-VITAMINS) TABS, Take 1 tablet by mouth daily., Disp: , Rfl:    ondansetron (ZOFRAN) 8 MG tablet, TAKE 1 TABLET(8 MG) BY MOUTH TWICE DAILY AS NEEDED FOR NAUSEA OR VOMITING, Disp: 30 tablet, Rfl: 1   Pancrelipase, Lip-Prot-Amyl, 40000-126000 units CPEP, Take 1 Dose by mouth in the morning, at noon, in the evening, and at bedtime., Disp: , Rfl:    prochlorperazine (COMPAZINE) 10 MG tablet, Take 1 tablet (10 mg total) by mouth every 6 (six) hours as needed (Nausea or vomiting). (Patient not taking: Reported on 11/26/2020), Disp: 30 tablet, Rfl:  1   traZODone (DESYREL) 100 MG tablet, Take 100 mg by mouth at bedtime., Disp: , Rfl:  No current facility-administered medications for this visit.  Facility-Administered Medications Ordered in Other Visits:    0.9 %  sodium chloride infusion, , Intravenous, Once, Burns, Jennifer E, NP   0.9 %  sodium chloride infusion, , Intravenous, Once, Sindy Guadeloupe, MD   0.9 %  sodium chloride infusion, , Intravenous, Once, Sindy Guadeloupe, MD   alteplase (CATHFLO ACTIVASE) injection 2 mg, 2 mg, Intracatheter, Once PRN, Jacquelin Hawking, NP   heparin lock flush 100 unit/mL, 500 Units, Intracatheter, Once PRN, Sindy Guadeloupe, MD   heparin lock flush 100 unit/mL, 250 Units, Intracatheter, Once PRN, Jacquelin Hawking, NP   heparin lock flush 100 unit/mL, 500 Units, Intravenous, Once, Sindy Guadeloupe, MD   sodium chloride flush (NS) 0.9 % injection 10 mL, 10 mL, Intravenous, PRN, Sindy Guadeloupe, MD, 10 mL at 06/06/20 1039   sodium chloride flush (NS) 0.9 % injection 10 mL, 10 mL, Intracatheter, PRN, Sindy Guadeloupe, MD   sodium chloride flush (NS) 0.9 % injection 10 mL, 10 mL, Intracatheter, PRN, Faythe Casa E, NP, 10 mL at 06/20/20 1030   sodium chloride flush (NS) 0.9 % injection 10 mL, 10 mL, Intravenous, PRN, Faythe Casa E, NP, 10 mL at 08/12/20 1335   sodium chloride flush (NS) 0.9 % injection 10 mL, 10 mL, Intravenous, PRN, Whitaker Holderman, Vonna Kotyk R, NP, 10 mL at 09/02/20 1411   sodium chloride flush (NS) 0.9 % injection 3 mL, 3 mL, Intracatheter, PRN, Jacquelin Hawking, NP  Physical exam:  There were no vitals filed for this visit.  Physical Exam Constitutional:      General: She is not in acute distress.    Appearance: Normal appearance.  Cardiovascular:     Rate and Rhythm: Normal rate and regular rhythm.     Heart sounds: Normal heart sounds.  Pulmonary:     Effort: Pulmonary effort is normal.     Breath sounds: Normal breath sounds.  Abdominal:     General: Bowel sounds are normal. There  is no distension.     Palpations: Abdomen is soft. There is no mass.     Tenderness: There is no abdominal tenderness. There is no guarding.  Skin:    General: Skin is warm  and dry.  Neurological:     Mental Status: She is alert and oriented to person, place, and time.     CMP Latest Ref Rng & Units 10/14/2020  Glucose 70 - 99 mg/dL 110(H)  BUN 8 - 23 mg/dL 16  Creatinine 0.44 - 1.00 mg/dL 0.93  Sodium 135 - 145 mmol/L 133(L)  Potassium 3.5 - 5.1 mmol/L 4.2  Chloride 98 - 111 mmol/L 97(L)  CO2 22 - 32 mmol/L 29  Calcium 8.9 - 10.3 mg/dL 9.0  Total Protein 6.5 - 8.1 g/dL 7.0  Total Bilirubin 0.3 - 1.2 mg/dL 0.8  Alkaline Phos 38 - 126 U/L 60  AST 15 - 41 U/L 28  ALT 0 - 44 U/L 16   CBC Latest Ref Rng & Units 10/14/2020  WBC 4.0 - 10.5 K/uL 7.3  Hemoglobin 12.0 - 15.0 g/dL 12.6  Hematocrit 36.0 - 46.0 % 38.5  Platelets 150 - 400 K/uL 259     Assessment and plan- Patient is a 80 y.o. female with pancreatic adenocarcinoma stage IB T2 N0 M0 s/p Whipple surgery and adjuvant chemotherapy ending in June 2022.  This is a routine follow-up visit to discuss CT scan results and further management  CT from [place] on 03/04/2021 did not show any evidence of recurrent or metastatic disease in the chest, abdomen, and pelvis.  Patient noted to have stable scattered 3 mm nodules in right middle lobe lung and unchanged position of common bile duct stent with persistent mild to moderate intrahepatic biliary ductal dilation.  Labs are grossly unchanged from baseline.  Patient desires to get her CT scans at [facility] since she gets it done without contrast and will be getting her next scan on 06/03/2021.  Patient will subsequently have follow-up by Dr. Mariah Milling on 06/03/2021.  We will also have her scheduled for labs and to see Dr. Janese Banks in March 2023.   Visit Diagnosis 1. Malignant neoplasm of head of pancreas ([place])    Time Total: 15 minutes  Greater than 50%  of this time was spent counseling and coordinating  care related to the above assessment and plan.  Signed by: Altha Harm, PhD, NP-C

## 2021-03-11 NOTE — Progress Notes (Signed)
Pt presents for 44mo follow-up. She reports that she still feels "drained" from chemotherapy, and has not been able to gain weight back.

## 2021-03-12 LAB — CANCER ANTIGEN 19-9: CA 19-9: 8 U/mL (ref 0–35)

## 2021-06-03 ENCOUNTER — Ambulatory Visit: Admit: 2021-06-03 | Payer: Medicare Other | Admitting: Cardiology

## 2021-06-03 SURGERY — LEFT HEART CATH AND CORONARY ANGIOGRAPHY
Anesthesia: Moderate Sedation

## 2021-06-16 ENCOUNTER — Inpatient Hospital Stay: Payer: Medicare Other

## 2021-06-16 ENCOUNTER — Inpatient Hospital Stay: Payer: Medicare Other | Attending: Oncology | Admitting: Oncology

## 2021-06-16 ENCOUNTER — Other Ambulatory Visit: Payer: Self-pay

## 2021-06-16 VITALS — BP 132/77 | HR 58 | Temp 97.5°F | Resp 14 | Wt 112.9 lb

## 2021-06-16 DIAGNOSIS — Z8507 Personal history of malignant neoplasm of pancreas: Secondary | ICD-10-CM | POA: Diagnosis not present

## 2021-06-16 DIAGNOSIS — C250 Malignant neoplasm of head of pancreas: Secondary | ICD-10-CM | POA: Diagnosis present

## 2021-06-16 DIAGNOSIS — Z08 Encounter for follow-up examination after completed treatment for malignant neoplasm: Secondary | ICD-10-CM

## 2021-06-16 DIAGNOSIS — Z95828 Presence of other vascular implants and grafts: Secondary | ICD-10-CM

## 2021-06-16 LAB — CBC WITH DIFFERENTIAL/PLATELET
Abs Immature Granulocytes: 0.02 K/uL (ref 0.00–0.07)
Basophils Absolute: 0.1 K/uL (ref 0.0–0.1)
Basophils Relative: 1 %
Eosinophils Absolute: 0.2 K/uL (ref 0.0–0.5)
Eosinophils Relative: 3 %
HCT: 35.2 % — ABNORMAL LOW (ref 36.0–46.0)
Hemoglobin: 12 g/dL (ref 12.0–15.0)
Immature Granulocytes: 0 %
Lymphocytes Relative: 12 %
Lymphs Abs: 0.8 K/uL (ref 0.7–4.0)
MCH: 32.3 pg (ref 26.0–34.0)
MCHC: 34.1 g/dL (ref 30.0–36.0)
MCV: 94.9 fL (ref 80.0–100.0)
Monocytes Absolute: 0.6 K/uL (ref 0.1–1.0)
Monocytes Relative: 10 %
Neutro Abs: 4.8 K/uL (ref 1.7–7.7)
Neutrophils Relative %: 74 %
Platelets: 281 K/uL (ref 150–400)
RBC: 3.71 MIL/uL — ABNORMAL LOW (ref 3.87–5.11)
RDW: 12.5 % (ref 11.5–15.5)
WBC: 6.5 K/uL (ref 4.0–10.5)
nRBC: 0 % (ref 0.0–0.2)

## 2021-06-16 LAB — COMPREHENSIVE METABOLIC PANEL WITH GFR
ALT: 14 U/L (ref 0–44)
AST: 21 U/L (ref 15–41)
Albumin: 3.9 g/dL (ref 3.5–5.0)
Alkaline Phosphatase: 71 U/L (ref 38–126)
Anion gap: 6 (ref 5–15)
BUN: 19 mg/dL (ref 8–23)
CO2: 29 mmol/L (ref 22–32)
Calcium: 9 mg/dL (ref 8.9–10.3)
Chloride: 96 mmol/L — ABNORMAL LOW (ref 98–111)
Creatinine, Ser: 0.93 mg/dL (ref 0.44–1.00)
GFR, Estimated: >60 mL/min (ref >60)
Glucose, Bld: 110 mg/dL — ABNORMAL HIGH (ref 70–99)
Potassium: 4.7 mmol/L (ref 3.5–5.1)
Sodium: 131 mmol/L — ABNORMAL LOW (ref 135–145)
Total Bilirubin: 0.5 mg/dL (ref 0.3–1.2)
Total Protein: 6.5 g/dL (ref 6.5–8.1)

## 2021-06-16 MED ORDER — HEPARIN SOD (PORK) LOCK FLUSH 100 UNIT/ML IV SOLN
500.0000 [IU] | Freq: Once | INTRAVENOUS | Status: AC
  Administered 2021-06-16: 500 [IU] via INTRAVENOUS
  Filled 2021-06-16: qty 5

## 2021-06-16 MED ORDER — SODIUM CHLORIDE 0.9% FLUSH
10.0000 mL | Freq: Once | INTRAVENOUS | Status: AC
  Administered 2021-06-16: 10 mL via INTRAVENOUS
  Filled 2021-06-16: qty 10

## 2021-06-16 NOTE — Progress Notes (Signed)
Passed out about a month ago: at daughters church it was the 5th time occuring. Followed by a cardiologist. Pt will like to talk about her nausea and her CT scan.  ?

## 2021-06-17 ENCOUNTER — Other Ambulatory Visit: Payer: Self-pay | Admitting: *Deleted

## 2021-06-17 ENCOUNTER — Telehealth: Payer: Self-pay | Admitting: *Deleted

## 2021-06-17 DIAGNOSIS — C250 Malignant neoplasm of head of pancreas: Secondary | ICD-10-CM

## 2021-06-17 LAB — CANCER ANTIGEN 19-9: CA 19-9: 9 U/mL (ref 0–35)

## 2021-06-17 MED ORDER — ONDANSETRON HCL 4 MG PO TABS: 4.0000 mg | ORAL_TABLET | Freq: Three times a day (TID) | ORAL | 2 refills | Status: DC | PRN

## 2021-06-17 NOTE — Telephone Encounter (Signed)
Can you send for 4 mg Q8 prn 30 tab and 2 refills?

## 2021-06-17 NOTE — Telephone Encounter (Signed)
Patient called stating that Dr Janese Banks was going to send prescription for Ondansetron in for her yesterday, but it was not sent, I am unsure if the 4 mg or 8 mg was to be ordered or what directions are. Please advise ?

## 2021-06-22 ENCOUNTER — Encounter: Payer: Self-pay | Admitting: Nurse Practitioner

## 2021-06-22 ENCOUNTER — Encounter: Payer: Self-pay | Admitting: Oncology

## 2021-06-22 NOTE — Progress Notes (Signed)
? ? ? ?Hematology/Oncology Consult note ?[place]  ?Telephone:(336) [phone] [phone] ? ?Patient Care Team: ?Baxter Hire, MD as PCP - General (Internal Medicine)  ? ?Name of the patient: Emily Harding  ?[id number]  ?12-Mar-1941  ? ?Date of visit: 06/22/21 ? ?Diagnosis- pancreatic adenocarcinoma stage Ib T2 N0 M0 s/p Whipple surgery and adjuvant chemotherapy ? ?Chief complaint/ Reason for visit-routine follow-up of pancreatic cancer ? ?Heme/Onc history:  patient is a 81 year old female with an oncology history as follows: ?1.  On 11/22/2019 she presented with left lower quadrant abdominal pain and persistent nausea.  CT abdomen and pelvis showed a 3.1 x 2.3 cm mass in the head of the pancreas with diffuse pancreatic ductal dilatation.  CA 19-9 elevated at 166 ?2.  EUS showed 2.0 x 1.9 cm mass in the head of pancreas with no vessel involvement.  UT2N0.  Pathology showed adenocarcinoma ?3.  CT chest showed 2 to 3 mm lung nodules with no evidence of distant metastatic disease.  In October 2021 patient underwent Whipple resection with pathology demonstrating 3.1 cm adenocarcinoma with mucinous features.  Uncinate margin positive for tumor.  Tumor approaches less than 1 mm within cautery.  Positive for PNI.  0 out of 18 lymph nodes positive for malignancy. ?4.  Patient evaluated by medical oncology at [facility] and was recommended adjuvant chemotherapy followed by chemoradiation ?  ?Multiple adjuvant regimens including gem Abraxane, gemcitabine and Xeloda and gemcitabine single agent discussed with patient and patient decided to proceed with single agent gemcitabine starting 04/11/2020.  She completed adjuvant chemotherapy in June 2022. ?  ? ?Interval history-patient has had frequent episodes of passing out especially when she was at church and is following up with cardiology for syncopal episode work-up.  Reports chronic fatigue.  Denies any abdominal pain or changes in her bowel habits. ? ?ECOG  PS- 1 ?Pain scale- 0 ? ? ?Review of systems- Review of Systems  ?Constitutional:  Positive for malaise/fatigue. Negative for chills, fever and weight loss.  ?HENT:  Negative for congestion, ear discharge and nosebleeds.   ?Eyes:  Negative for blurred vision.  ?Respiratory:  Negative for cough, hemoptysis, sputum production, shortness of breath and wheezing.   ?Cardiovascular:  Negative for chest pain, palpitations, orthopnea and claudication.  ?Gastrointestinal:  Negative for abdominal pain, blood in stool, constipation, diarrhea, heartburn, melena, nausea and vomiting.  ?Genitourinary:  Negative for dysuria, flank pain, frequency, hematuria and urgency.  ?Musculoskeletal:  Negative for back pain, joint pain and myalgias.  ?Skin:  Negative for rash.  ?Neurological:  Positive for dizziness. Negative for tingling, focal weakness, seizures, weakness and headaches.  ?Endo/Heme/Allergies:  Does not bruise/bleed easily.  ?Psychiatric/Behavioral:  Negative for depression and suicidal ideas. The patient does not have insomnia.    ? ? ? ?Allergies  ?Allergen Reactions  ? Hydromorphone Hcl Itching  ? Azithromycin   ? Codeine Sulfate Other (See Comments)  ? Doxycycline Calcium Other (See Comments)  ? Duloxetine Nausea Only  ? Erythromycin Ethylsuccinate Other (See Comments)  ? Macrolides And Ketolides Other (See Comments)  ? Nitrofurantoin   ?  Other reaction(s): Unknown ?Other reaction(s): Abdominal Pain ?Other reaction(s): Unknown  ? Sulfa Antibiotics   ? ? ? ?Past Medical History:  ?Diagnosis Date  ? Anxiety   ? Arthritis   ? Breast cancer ([place]) 1993  ? RT MASTECTOMY  ? Heartburn   ? HTN (hypertension)   ? Pancreatic cancer ([place])   ? Personal history of chemotherapy   ? S/P  chemotherapy, time since greater than 12 weeks 1993  ? BREAST CA  ? ? ? ?Past Surgical History:  ?Procedure Laterality Date  ? ABDOMINAL HYSTERECTOMY    ? colon polyp removal    ? GALLBLADDER SURGERY    ? [place]  ? MASTECTOMY  Right 1993  ? BREAST CA  ? PANCREAS SURGERY N/A 01/04/2020  ? Per patient removed head of pancreas due to cancer  ? PORTA CATH INSERTION N/A 04/04/2020  ? Procedure: PORTA CATH INSERTION;  Surgeon: Algernon Huxley, MD;  Location: [place] [facility];  Service: Cardiovascular;  Laterality: N/A;  ? removal of fallopian tubes  1989  ? ? ?Social History  ? ?Socioeconomic History  ? Marital status: Widowed  ?  Spouse name: Not on file  ? Number of children: Not on file  ? Years of education: Not on file  ? Highest education level: Not on file  ?Occupational History  ? Not on file  ?Tobacco Use  ? Smoking status: Never  ? Smokeless tobacco: Never  ?Vaping Use  ? Vaping Use: Never used  ?Substance and Sexual Activity  ? Alcohol use: No  ? Drug use: No  ? Sexual activity: Not Currently  ?Other Topics Concern  ? Not on file  ?Social History Narrative  ? Not on file  ? ?Social Determinants of Health  ? ?Financial Resource Strain: Not on file  ?Food Insecurity: Not on file  ?Transportation Needs: Not on file  ?Physical Activity: Not on file  ?Stress: Not on file  ?Social Connections: Not on file  ?Intimate Partner Violence: Not on file  ? ? ?Family History  ?Problem Relation Age of Onset  ? Breast cancer Neg Hx   ? Kidney cancer Neg Hx   ? Bladder Cancer Neg Hx   ? Prostate cancer Neg Hx   ? ? ? ?Current Outpatient Medications:  ?  aspirin EC 81 MG tablet, Take 81 mg by mouth daily., Disp: , Rfl:  ?  atenolol (TENORMIN) 50 MG tablet, Take 50 mg by mouth 2 (two) times daily., Disp: , Rfl:  ?  celecoxib (CELEBREX) 200 MG capsule, Take 200 mg by mouth daily., Disp: , Rfl:  ?  cetirizine (ZYRTEC) 10 MG tablet, Take 10 mg by mouth daily as needed., Disp: , Rfl:  ?  esomeprazole (NEXIUM) 40 MG capsule, Take 40 mg by mouth daily at 12 noon., Disp: , Rfl:  ?  gabapentin (NEURONTIN) 100 MG capsule, TAKE 2 CAPSULES (200 MG TOTAL) BY MOUTH 2 (TWO) TIMES DAILY., Disp: , Rfl:  ?  hydrALAZINE (APRESOLINE) 10 MG tablet, Take 10 mg by mouth 2  (two) times daily., Disp: , Rfl:  ?  Hydrocortisone Acetate (VAGISIL) 1 % CREA, Apply 1 application topically 3 (three) times daily as needed., Disp: 15 g, Rfl: 0 ?  isosorbide mononitrate (IMDUR) 30 MG 24 hr tablet, Take 30 mg by mouth daily., Disp: , Rfl:  ?  losartan (COZAAR) 100 MG tablet, Take 100 mg by mouth daily., Disp: , Rfl:  ?  Multiple Vitamin (MULTI-VITAMINS) TABS, Take 1 tablet by mouth daily., Disp: , Rfl:  ?  traZODone (DESYREL) 100 MG tablet, Take 100 mg by mouth at bedtime., Disp: , Rfl:  ?  amLODipine (NORVASC) 2.5 MG tablet, Take 2.5 mg by mouth daily. (Patient not taking: Reported on 09/05/2020), Disp: , Rfl:  ?  citalopram (CELEXA) 20 MG tablet, TAKE 1 TABLET (20 MG TOTAL) BY MOUTH ONCE DAILY. (Patient not taking:  Reported on 06/16/2021), Disp: , Rfl:  ?  clorazepate (TRANXENE) 3.75 MG tablet, TAKE 1 TABLET(3.75 MG) BY MOUTH TWICE DAILY (Patient not taking: Reported on 06/16/2021), Disp: , Rfl:  ?  cyclobenzaprine (FLEXERIL) 10 MG tablet, TAKE 1 TABLET (10 MG TOTAL) BY MOUTH 3 (THREE) TIMES DAILY AS NEEDED FOR MUSCLE SPASMS. (Patient not taking: Reported on 04/11/2020), Disp: , Rfl:  ?  gabapentin (NEURONTIN) 400 MG capsule, Take 400 mg by mouth 3 (three) times daily. (Patient not taking: Reported on 06/16/2021), Disp: , Rfl:  ?  hydrALAZINE (APRESOLINE) 10 MG tablet, Take 1 tablet by mouth 2 (two) times daily. (Patient not taking: Reported on 06/16/2021), Disp: , Rfl:  ?  HYDROcodone-acetaminophen (NORCO) 5-325 MG tablet, Take 1 tablet by mouth every 4 (four) hours as needed for moderate pain. (Patient not taking: Reported on 11/26/2020), Disp: 45 tablet, Rfl: 0 ?  lidocaine-prilocaine (EMLA) cream, Apply small amount of cream over port site 1 1/2 hours before each treatment, place saran wrap over cream to protect clothin (Patient not taking: Reported on 11/26/2020), Disp: 30 g, Rfl: 3 ?  LORazepam (ATIVAN) 0.5 MG tablet, Take 1 tablet (0.5 mg total) by mouth every 6 (six) hours as needed (Nausea or  vomiting). (Patient not taking: Reported on 03/11/2021), Disp: 30 tablet, Rfl: 1 ?  meloxicam (MOBIC) 7.5 MG tablet, TAKE 1 TABLET (7.5 MG TOTAL) BY MOUTH ONCE DAILY. (Patient not taking: Reported on 06/17/18

## 2021-09-17 ENCOUNTER — Inpatient Hospital Stay: Payer: Medicare Other | Attending: Internal Medicine

## 2021-09-17 ENCOUNTER — Inpatient Hospital Stay (HOSPITAL_BASED_OUTPATIENT_CLINIC_OR_DEPARTMENT_OTHER): Payer: Medicare Other | Admitting: Oncology

## 2021-09-17 ENCOUNTER — Encounter: Payer: Self-pay | Admitting: Oncology

## 2021-09-17 VITALS — BP 148/49 | HR 57 | Temp 98.4°F | Resp 16 | Wt 111.7 lb

## 2021-09-17 DIAGNOSIS — Z08 Encounter for follow-up examination after completed treatment for malignant neoplasm: Secondary | ICD-10-CM | POA: Diagnosis not present

## 2021-09-17 DIAGNOSIS — C250 Malignant neoplasm of head of pancreas: Secondary | ICD-10-CM | POA: Diagnosis present

## 2021-09-17 DIAGNOSIS — R300 Dysuria: Secondary | ICD-10-CM

## 2021-09-17 DIAGNOSIS — Z95828 Presence of other vascular implants and grafts: Secondary | ICD-10-CM

## 2021-09-17 DIAGNOSIS — Z8507 Personal history of malignant neoplasm of pancreas: Secondary | ICD-10-CM | POA: Diagnosis not present

## 2021-09-17 DIAGNOSIS — R339 Retention of urine, unspecified: Secondary | ICD-10-CM

## 2021-09-17 DIAGNOSIS — I10 Essential (primary) hypertension: Secondary | ICD-10-CM | POA: Insufficient documentation

## 2021-09-17 DIAGNOSIS — Z90710 Acquired absence of both cervix and uterus: Secondary | ICD-10-CM | POA: Diagnosis not present

## 2021-09-17 LAB — URINALYSIS, COMPLETE (UACMP) WITH MICROSCOPIC
Bacteria, UA: NONE SEEN
Bilirubin Urine: NEGATIVE
Glucose, UA: NEGATIVE mg/dL
Hgb urine dipstick: NEGATIVE
Ketones, ur: NEGATIVE mg/dL
Leukocytes,Ua: NEGATIVE
Nitrite: NEGATIVE
Protein, ur: 30 mg/dL — AB
Specific Gravity, Urine: 1.014 (ref 1.005–1.030)
pH: 5 (ref 5.0–8.0)

## 2021-09-17 LAB — COMPREHENSIVE METABOLIC PANEL
ALT: 14 U/L (ref 0–44)
AST: 21 U/L (ref 15–41)
Albumin: 3.9 g/dL (ref 3.5–5.0)
Alkaline Phosphatase: 66 U/L (ref 38–126)
Anion gap: 5 (ref 5–15)
BUN: 20 mg/dL (ref 8–23)
CO2: 27 mmol/L (ref 22–32)
Calcium: 8.1 mg/dL — ABNORMAL LOW (ref 8.9–10.3)
Chloride: 94 mmol/L — ABNORMAL LOW (ref 98–111)
Creatinine, Ser: 0.91 mg/dL (ref 0.44–1.00)
GFR, Estimated: >60 mL/min (ref >60)
Glucose, Bld: 103 mg/dL — ABNORMAL HIGH (ref 70–99)
Potassium: 4.3 mmol/L (ref 3.5–5.1)
Sodium: 126 mmol/L — ABNORMAL LOW (ref 135–145)
Total Bilirubin: 0.6 mg/dL (ref 0.3–1.2)
Total Protein: 6.7 g/dL (ref 6.5–8.1)

## 2021-09-17 LAB — CBC WITH DIFFERENTIAL/PLATELET
Abs Immature Granulocytes: 0.01 10*3/uL (ref 0.00–0.07)
Basophils Absolute: 0 10*3/uL (ref 0.0–0.1)
Basophils Relative: 1 %
Eosinophils Absolute: 0.2 10*3/uL (ref 0.0–0.5)
Eosinophils Relative: 3 %
HCT: 34.4 % — ABNORMAL LOW (ref 36.0–46.0)
Hemoglobin: 12.1 g/dL (ref 12.0–15.0)
Immature Granulocytes: 0 %
Lymphocytes Relative: 14 %
Lymphs Abs: 0.8 10*3/uL (ref 0.7–4.0)
MCH: 33.9 pg (ref 26.0–34.0)
MCHC: 35.2 g/dL (ref 30.0–36.0)
MCV: 96.4 fL (ref 80.0–100.0)
Monocytes Absolute: 0.7 10*3/uL (ref 0.1–1.0)
Monocytes Relative: 13 %
Neutro Abs: 4.1 10*3/uL (ref 1.7–7.7)
Neutrophils Relative %: 69 %
Platelets: 302 10*3/uL (ref 150–400)
RBC: 3.57 MIL/uL — ABNORMAL LOW (ref 3.87–5.11)
RDW: 11.9 % (ref 11.5–15.5)
WBC: 5.9 10*3/uL (ref 4.0–10.5)
nRBC: 0 % (ref 0.0–0.2)

## 2021-09-17 MED ORDER — HEPARIN SOD (PORK) LOCK FLUSH 100 UNIT/ML IV SOLN
500.0000 [IU] | Freq: Once | INTRAVENOUS | Status: AC
  Administered 2021-09-17: 500 [IU] via INTRAVENOUS
  Filled 2021-09-17: qty 5

## 2021-09-17 MED ORDER — SODIUM CHLORIDE 0.9% FLUSH
10.0000 mL | Freq: Once | INTRAVENOUS | Status: AC
  Administered 2021-09-17: 10 mL via INTRAVENOUS
  Filled 2021-09-17: qty 10

## 2021-09-17 NOTE — Progress Notes (Signed)
Pt is aware of no UTI.

## 2021-09-17 NOTE — Progress Notes (Signed)
Pt states that for the past month or so she has passed out twice: once at home and once at church. Being followed by cardiology. Also, will like a suggestion on what to do regarding her urine: retention of urine.

## 2021-09-18 LAB — CANCER ANTIGEN 19-9: CA 19-9: 11 U/mL (ref 0–35)

## 2021-09-20 ENCOUNTER — Encounter: Payer: Self-pay | Admitting: Nurse Practitioner

## 2021-09-20 ENCOUNTER — Encounter: Payer: Self-pay | Admitting: Oncology

## 2021-09-20 NOTE — Progress Notes (Signed)
Hematology/Oncology Consult note [facility]  [phone] [phone]  Patient Care Team: Baxter Hire, MD as PCP - General (Internal Medicine)   Name of the patient: Emily Harding  [id number]  08-Jun-1940   Date of visit: 09/20/21  Diagnosis-  pancreatic adenocarcinoma stage Ib T2 N0 M0 s/p Whipple surgery and adjuvant chemotherapy    Chief complaint/ Reason for visit-routine follow-up of pancreatic cancer currently in remission  Heme/Onc history:  patient is a 81 year old female with an oncology history as follows: 1.  On 11/22/2019 she presented with left lower quadrant abdominal pain and persistent nausea.  CT abdomen and pelvis showed a 3.1 x 2.3 cm mass in the head of the pancreas with diffuse pancreatic ductal dilatation.  CA 19-9 elevated at 166 2.  EUS showed 2.0 x 1.9 cm mass in the head of pancreas with no vessel involvement.  UT2N0.  Pathology showed adenocarcinoma 3.  CT chest showed 2 to 3 mm lung nodules with no evidence of distant metastatic disease.  In October 2021 patient underwent Whipple resection with pathology demonstrating 3.1 cm adenocarcinoma with mucinous features.  Uncinate margin positive for tumor.  Tumor approaches less than 1 mm within cautery.  Positive for PNI.  0 out of 18 lymph nodes positive for malignancy. 4.  Patient evaluated by medical oncology at [facility] and was recommended adjuvant chemotherapy followed by chemoradiation   Multiple adjuvant regimens including gem Abraxane, gemcitabine and Xeloda and gemcitabine single agent discussed with patient and patient decided to proceed with single agent gemcitabine starting 04/11/2020.  She completed adjuvant chemotherapy in June 2022.    Interval history-patient has occasional nausea.  Appetite and weight have remained stable.  She continues to have intermittent episodes of syncope which was worked up by cardiology as well and still no etiology has been found.  She  does report some dysuria today  ECOG PS- 1 Pain scale- 0   Review of systems- Review of Systems  Constitutional:  Positive for malaise/fatigue. Negative for chills, fever and weight loss.  HENT:  Negative for congestion, ear discharge and nosebleeds.   Eyes:  Negative for blurred vision.  Respiratory:  Negative for cough, hemoptysis, sputum production, shortness of breath and wheezing.   Cardiovascular:  Negative for chest pain, palpitations, orthopnea and claudication.  Gastrointestinal:  Negative for abdominal pain, blood in stool, constipation, diarrhea, heartburn, melena, nausea and vomiting.  Genitourinary:  Positive for dysuria. Negative for flank pain, frequency, hematuria and urgency.  Musculoskeletal:  Negative for back pain, joint pain and myalgias.  Skin:  Negative for rash.  Neurological:  Negative for dizziness, tingling, focal weakness, seizures, weakness and headaches.  Endo/Heme/Allergies:  Does not bruise/bleed easily.  Psychiatric/Behavioral:  Negative for depression and suicidal ideas. The patient does not have insomnia.       Allergies  Allergen Reactions   Hydromorphone Hcl Itching   Azithromycin    Codeine Sulfate Other (See Comments)   Doxycycline Calcium Other (See Comments)   Duloxetine Nausea Only   Erythromycin Ethylsuccinate Other (See Comments)   Macrolides And Ketolides Other (See Comments)   Nitrofurantoin     Other reaction(s): Unknown Other reaction(s): Abdominal Pain Other reaction(s): Unknown   Sulfa Antibiotics      Past Medical History:  Diagnosis Date   Anxiety    Arthritis    Breast cancer ([place]) 1993   RT MASTECTOMY   Heartburn    HTN (hypertension)    Pancreatic cancer ([place])  Personal history of chemotherapy    S/P chemotherapy, time since greater than 12 weeks 1993   BREAST CA     Past Surgical History:  Procedure Laterality Date   ABDOMINAL HYSTERECTOMY     colon polyp removal     GALLBLADDER SURGERY      LAPAROSCOPIC OOPHERECTOMY  1978   MASTECTOMY Right 1993   BREAST CA   PANCREAS SURGERY N/A 01/04/2020   Per patient removed head of pancreas due to cancer   PORTA CATH INSERTION N/A 04/04/2020   Procedure: PORTA CATH INSERTION;  Surgeon: Algernon Huxley, MD;  Location: [place] [facility];  Service: Cardiovascular;  Laterality: N/A;   removal of fallopian tubes  1989    Social History   Socioeconomic History   Marital status: Widowed    Spouse name: Not on file   Number of children: Not on file   Years of education: Not on file   Highest education level: Not on file  Occupational History   Not on file  Tobacco Use   Smoking status: Never   Smokeless tobacco: Never  Vaping Use   Vaping Use: Never used  Substance and Sexual Activity   Alcohol use: No   Drug use: No   Sexual activity: Not Currently  Other Topics Concern   Not on file  Social History Narrative   Not on file   Social Determinants of Health   Financial Resource Strain: Not on file  Food Insecurity: Not on file  Transportation Needs: Not on file  Physical Activity: Not on file  Stress: Not on file  Social Connections: Not on file  Intimate Partner Violence: Not on file    Family History  Problem Relation Age of Onset   Breast cancer Neg Hx    Kidney cancer Neg Hx    Bladder Cancer Neg Hx    Prostate cancer Neg Hx      Current Outpatient Medications:    aspirin EC 81 MG tablet, Take 81 mg by mouth daily., Disp: , Rfl:    atenolol (TENORMIN) 50 MG tablet, Take 50 mg by mouth 2 (two) times daily., Disp: , Rfl:    celecoxib (CELEBREX) 200 MG capsule, Take 200 mg by mouth daily., Disp: , Rfl:    cetirizine (ZYRTEC) 10 MG tablet, Take 10 mg by mouth daily as needed., Disp: , Rfl:    esomeprazole (NEXIUM) 40 MG capsule, Take 40 mg by mouth daily at 12 noon., Disp: , Rfl:    gabapentin (NEURONTIN) 100 MG capsule, TAKE 2 CAPSULES (200 MG TOTAL) BY MOUTH 2 (TWO) TIMES DAILY., Disp: , Rfl:    hydrALAZINE  (APRESOLINE) 10 MG tablet, Take 10 mg by mouth 2 (two) times daily., Disp: , Rfl:    Hydrocortisone Acetate (VAGISIL) 1 % CREA, Apply 1 application topically 3 (three) times daily as needed., Disp: 15 g, Rfl: 0   isosorbide mononitrate (IMDUR) 30 MG 24 hr tablet, Take 30 mg by mouth daily., Disp: , Rfl:    losartan (COZAAR) 100 MG tablet, Take 100 mg by mouth daily., Disp: , Rfl:    Multiple Vitamin (MULTI-VITAMINS) TABS, Take 1 tablet by mouth daily., Disp: , Rfl:    ondansetron (ZOFRAN) 4 MG tablet, Take 1 tablet (4 mg total) by mouth every 8 (eight) hours as needed for nausea or vomiting., Disp: 30 tablet, Rfl: 2   traZODone (DESYREL) 100 MG tablet, Take 100 mg by mouth at bedtime., Disp: , Rfl:    citalopram (CELEXA) 20  MG tablet, TAKE 1 TABLET (20 MG TOTAL) BY MOUTH ONCE DAILY. (Patient not taking: Reported on 06/16/2021), Disp: , Rfl:    clorazepate (TRANXENE) 3.75 MG tablet, TAKE 1 TABLET(3.75 MG) BY MOUTH TWICE DAILY (Patient not taking: Reported on 06/16/2021), Disp: , Rfl:    cyclobenzaprine (FLEXERIL) 10 MG tablet, TAKE 1 TABLET (10 MG TOTAL) BY MOUTH 3 (THREE) TIMES DAILY AS NEEDED FOR MUSCLE SPASMS. (Patient not taking: Reported on 04/11/2020), Disp: , Rfl:    gabapentin (NEURONTIN) 400 MG capsule, Take 400 mg by mouth 3 (three) times daily. (Patient not taking: Reported on 06/16/2021), Disp: , Rfl:    HYDROcodone-acetaminophen (NORCO) 5-325 MG tablet, Take 1 tablet by mouth every 4 (four) hours as needed for moderate pain. (Patient not taking: Reported on 11/26/2020), Disp: 45 tablet, Rfl: 0   lidocaine-prilocaine (EMLA) cream, Apply small amount of cream over port site 1 1/2 hours before each treatment, place saran wrap over cream to protect clothin (Patient not taking: Reported on 11/26/2020), Disp: 30 g, Rfl: 3   LORazepam (ATIVAN) 0.5 MG tablet, Take 1 tablet (0.5 mg total) by mouth every 6 (six) hours as needed (Nausea or vomiting). (Patient not taking: Reported on 03/11/2021), Disp: 30  tablet, Rfl: 1   meloxicam (MOBIC) 7.5 MG tablet, TAKE 1 TABLET (7.5 MG TOTAL) BY MOUTH ONCE DAILY. (Patient not taking: Reported on 06/16/2021), Disp: , Rfl:    prochlorperazine (COMPAZINE) 10 MG tablet, Take 1 tablet (10 mg total) by mouth every 6 (six) hours as needed (Nausea or vomiting). (Patient not taking: Reported on 11/26/2020), Disp: 30 tablet, Rfl: 1   zolpidem (AMBIEN) 10 MG tablet, TAKE 1 TABLET BY MOUTH AT BEDTIME SLEEP (Patient not taking: Reported on 06/16/2021), Disp: , Rfl:  No current facility-administered medications for this visit.  Facility-Administered Medications Ordered in Other Visits:    0.9 %  sodium chloride infusion, , Intravenous, Once, Burns, Jennifer E, NP   0.9 %  sodium chloride infusion, , Intravenous, Once, Sindy Guadeloupe, MD   0.9 %  sodium chloride infusion, , Intravenous, Once, Sindy Guadeloupe, MD   alteplase (CATHFLO ACTIVASE) injection 2 mg, 2 mg, Intracatheter, Once PRN, Jacquelin Hawking, NP   heparin lock flush 100 unit/mL, 500 Units, Intracatheter, Once PRN, Sindy Guadeloupe, MD   heparin lock flush 100 unit/mL, 250 Units, Intracatheter, Once PRN, Jacquelin Hawking, NP   heparin lock flush 100 unit/mL, 500 Units, Intravenous, Once, Sindy Guadeloupe, MD   sodium chloride flush (NS) 0.9 % injection 10 mL, 10 mL, Intravenous, PRN, Sindy Guadeloupe, MD, 10 mL at 06/06/20 1039   sodium chloride flush (NS) 0.9 % injection 10 mL, 10 mL, Intracatheter, PRN, Sindy Guadeloupe, MD   sodium chloride flush (NS) 0.9 % injection 10 mL, 10 mL, Intracatheter, PRN, Faythe Casa E, NP, 10 mL at 06/20/20 1030   sodium chloride flush (NS) 0.9 % injection 10 mL, 10 mL, Intravenous, PRN, Faythe Casa E, NP, 10 mL at 08/12/20 1335   sodium chloride flush (NS) 0.9 % injection 10 mL, 10 mL, Intravenous, PRN, Borders, Vonna Kotyk R, NP, 10 mL at 09/02/20 1411   sodium chloride flush (NS) 0.9 % injection 3 mL, 3 mL, Intracatheter, PRN, Jacquelin Hawking, NP  Physical exam:  Vitals:    09/17/21 1354  BP: (!) 148/49  Pulse: (!) 57  Resp: 16  Temp: 98.4 F (36.9 C)  SpO2: 99%  Weight: 111 lb 11.2 oz (50.7 kg)   Physical Exam  Constitutional:      General: She is not in acute distress. Cardiovascular:     Rate and Rhythm: Normal rate and regular rhythm.     Heart sounds: Normal heart sounds.  Pulmonary:     Effort: Pulmonary effort is normal.     Breath sounds: Normal breath sounds.  Abdominal:     General: Bowel sounds are normal.     Palpations: Abdomen is soft.  Skin:    General: Skin is warm and dry.  Neurological:     Mental Status: She is alert and oriented to person, place, and time.         Latest Ref Rng & Units 09/17/2021    1:29 PM  CMP  Glucose 70 - 99 mg/dL 103   BUN 8 - 23 mg/dL 20   Creatinine 0.44 - 1.00 mg/dL 0.91   Sodium 135 - 145 mmol/L 126   Potassium 3.5 - 5.1 mmol/L 4.3   Chloride 98 - 111 mmol/L 94   CO2 22 - 32 mmol/L 27   Calcium 8.9 - 10.3 mg/dL 8.1   Total Protein 6.5 - 8.1 g/dL 6.7   Total Bilirubin 0.3 - 1.2 mg/dL 0.6   Alkaline Phos 38 - 126 U/L 66   AST 15 - 41 U/L 21   ALT 0 - 44 U/L 14       Latest Ref Rng & Units 09/17/2021    1:29 PM  CBC  WBC 4.0 - 10.5 K/uL 5.9   Hemoglobin 12.0 - 15.0 g/dL 12.1   Hematocrit 36.0 - 46.0 % 34.4   Platelets 150 - 400 K/uL 302      Assessment and plan- Patient is a 81 y.o. female with history of stage Ib pancreatic adenocarcinoma T2 N0 M0 s/p Whipple surgery and adjuvant chemotherapy completed in June 2022.  This is a routine follow-up visit  Patient has been getting her surveillance scans at [facility] because sheDoes not like to take oral contrast her recent 6 CT scan from 07/29/2021 showed no evidence of recurrent or progressive disease in chest abdomen and pelvis.  Previously noted peritoneal nodularity may be small pelvic vessels.  She will be getting a repeat surveillance scan in about 4 to 6 months at [facility].  Her CA 19-9 is presently normal.  Given her symptoms of dysuria  urinalysis was done in our clinic today which does not show any leukocytes nor nitrites.  She therefore does not have any evidence of UTI and I am holding off on starting any empiric antibiotics at this time  I will see her back in 5 months with labs and CT scans will be done at [facility].   Visit Diagnosis 1. Encounter for follow-up surveillance of pancreatic cancer   2. Dysuria   3. Urine retention      Dr. Randa Evens, MD, MPH [facility] [phone] 09/20/2021 8:20 PM

## 2021-10-13 ENCOUNTER — Other Ambulatory Visit: Payer: Self-pay

## 2021-11-07 ENCOUNTER — Other Ambulatory Visit: Payer: Self-pay

## 2021-12-15 ENCOUNTER — Ambulatory Visit: Payer: Self-pay | Admitting: Urology

## 2022-01-05 ENCOUNTER — Telehealth: Payer: Self-pay | Admitting: *Deleted

## 2022-01-05 NOTE — Telephone Encounter (Signed)
Ok to get her port out

## 2022-01-05 NOTE — Telephone Encounter (Signed)
Patient called asking to have her port removed stating Dr Janese Banks told her she could have it removed. Looks like Dr Lucky Cowboy inserted it in Jan 2022

## 2022-01-06 ENCOUNTER — Encounter: Payer: Self-pay | Admitting: Nurse Practitioner

## 2022-01-06 ENCOUNTER — Other Ambulatory Visit: Payer: Self-pay

## 2022-01-06 ENCOUNTER — Telehealth: Payer: Self-pay

## 2022-01-06 DIAGNOSIS — Z95828 Presence of other vascular implants and grafts: Secondary | ICD-10-CM

## 2022-01-06 NOTE — Telephone Encounter (Signed)
Referal was entered incorrectly for port removal. Have entered new referral for Vascular Surgery for Dr. Lucky Cowboy to remove port.

## 2022-01-06 NOTE — Progress Notes (Signed)
Port removal referral has been entered for Dr. Lucky Cowboy.

## 2022-01-06 NOTE — Telephone Encounter (Signed)
Reached out to pt and made her aware that port removal orders were entered. Advised pt to please give Dr. Bunnie Domino office a couple of days for them to reach out and schedule. Pt agrees and understands.

## 2022-01-13 ENCOUNTER — Telehealth (INDEPENDENT_AMBULATORY_CARE_PROVIDER_SITE_OTHER): Payer: Self-pay

## 2022-01-13 NOTE — Telephone Encounter (Signed)
I reached out to the patient on 01/06/22 to schedule her for a port removal and at that time she stated she would call me back after speaking with her daughter. I called again today and was given the same information regarding getting the patient scheduled.

## 2022-01-14 ENCOUNTER — Other Ambulatory Visit: Payer: Self-pay | Admitting: Oncology

## 2022-01-15 ENCOUNTER — Telehealth (INDEPENDENT_AMBULATORY_CARE_PROVIDER_SITE_OTHER): Payer: Self-pay

## 2022-01-15 ENCOUNTER — Encounter (INDEPENDENT_AMBULATORY_CARE_PROVIDER_SITE_OTHER): Payer: Self-pay

## 2022-01-15 ENCOUNTER — Telehealth (INDEPENDENT_AMBULATORY_CARE_PROVIDER_SITE_OTHER): Payer: Self-pay | Admitting: Vascular Surgery

## 2022-01-15 ENCOUNTER — Encounter: Payer: Self-pay | Admitting: Nurse Practitioner

## 2022-01-15 NOTE — Telephone Encounter (Signed)
Patient called and LVM she is ready to schedule her port removable and is wanting a phone back to schedule     Please call and advise

## 2022-01-15 NOTE — Telephone Encounter (Addendum)
Spoke with the patient and she is scheduled with Dr. Lucky Cowboy on 01/26/22 for a port removal with a 6:45 am arrival time to the MM. Pre-procedure instructions were discussed and will be mailed. Patient called back and has been rescheduled to 02/02/22 with a 6:45 am arrival time to the MM.

## 2022-02-02 ENCOUNTER — Encounter
Admission: RE | Disposition: A | Discharge status: Home / Self Care | Payer: Self-pay | Source: Home / Self Care | Attending: Vascular Surgery

## 2022-02-02 ENCOUNTER — Other Ambulatory Visit: Payer: Self-pay

## 2022-02-02 ENCOUNTER — Encounter: Payer: Self-pay | Admitting: Vascular Surgery

## 2022-02-02 ENCOUNTER — Ambulatory Visit
Admission: RE | Admit: 2022-02-02 | Discharge: 2022-02-02 | Disposition: A | Discharge status: Home / Self Care | Payer: Medicare Other | Attending: Vascular Surgery | Admitting: Vascular Surgery

## 2022-02-02 DIAGNOSIS — C259 Malignant neoplasm of pancreas, unspecified: Secondary | ICD-10-CM | POA: Diagnosis not present

## 2022-02-02 DIAGNOSIS — Z452 Encounter for adjustment and management of vascular access device: Secondary | ICD-10-CM | POA: Insufficient documentation

## 2022-02-02 DIAGNOSIS — C250 Malignant neoplasm of head of pancreas: Secondary | ICD-10-CM | POA: Diagnosis not present

## 2022-02-02 DIAGNOSIS — I10 Essential (primary) hypertension: Secondary | ICD-10-CM | POA: Diagnosis not present

## 2022-02-02 HISTORY — PX: PORTA CATH REMOVAL: CATH118286

## 2022-02-02 SURGERY — PORTA CATH REMOVAL
Anesthesia: Moderate Sedation

## 2022-02-02 MED ORDER — FAMOTIDINE 20 MG PO TABS: 40.0000 mg | ORAL_TABLET | Freq: Once | ORAL | Status: DC | PRN

## 2022-02-02 MED ORDER — CEFAZOLIN SODIUM-DEXTROSE 1-4 GM/50ML-% IV SOLN
INTRAVENOUS | Status: DC | PRN
  Administered 2022-02-02: 2 g via INTRAVENOUS

## 2022-02-02 MED ORDER — MIDAZOLAM HCL 5 MG/5ML IJ SOLN
INTRAMUSCULAR | Status: AC
  Filled 2022-02-02: qty 5

## 2022-02-02 MED ORDER — CEFAZOLIN SODIUM-DEXTROSE 2-4 GM/100ML-% IV SOLN: 2.0000 g | INTRAVENOUS | Status: DC

## 2022-02-02 MED ORDER — MIDAZOLAM HCL 2 MG/ML PO SYRP: 8.0000 mg | ORAL_SOLUTION | Freq: Once | ORAL | Status: DC | PRN

## 2022-02-02 MED ORDER — METHYLPREDNISOLONE SODIUM SUCC 125 MG IJ SOLR: 125.0000 mg | Freq: Once | INTRAMUSCULAR | Status: DC | PRN

## 2022-02-02 MED ORDER — DIPHENHYDRAMINE HCL 50 MG/ML IJ SOLN: 50.0000 mg | Freq: Once | INTRAMUSCULAR | Status: DC | PRN

## 2022-02-02 MED ORDER — ONDANSETRON HCL 4 MG/2ML IJ SOLN: 4.0000 mg | Freq: Four times a day (QID) | INTRAMUSCULAR | Status: DC | PRN

## 2022-02-02 MED ORDER — FENTANYL CITRATE (PF) 100 MCG/2ML IJ SOLN
INTRAMUSCULAR | Status: DC | PRN
  Administered 2022-02-02: 50 ug via INTRAVENOUS

## 2022-02-02 MED ORDER — MIDAZOLAM HCL 2 MG/2ML IJ SOLN
INTRAMUSCULAR | Status: DC | PRN
  Administered 2022-02-02: 2 mg via INTRAVENOUS

## 2022-02-02 MED ORDER — FENTANYL CITRATE (PF) 100 MCG/2ML IJ SOLN
INTRAMUSCULAR | Status: AC
  Filled 2022-02-02: qty 2

## 2022-02-02 MED ORDER — CEFAZOLIN SODIUM-DEXTROSE 2-4 GM/100ML-% IV SOLN
INTRAVENOUS | Status: AC
  Filled 2022-02-02: qty 100

## 2022-02-02 MED ORDER — SODIUM CHLORIDE 0.9 % IV SOLN: INTRAVENOUS | Status: DC

## 2022-02-02 SURGICAL SUPPLY — 7 items
COVER SURGICAL LIGHT HANDLE (MISCELLANEOUS) IMPLANT
DERMABOND ADVANCED .7 DNX12 (GAUZE/BANDAGES/DRESSINGS) IMPLANT
PACK ANGIOGRAPHY (CUSTOM PROCEDURE TRAY) IMPLANT
PENCIL ELECTRO HAND CTR (MISCELLANEOUS) IMPLANT
SUT MNCRL AB 4-0 PS2 18 (SUTURE) IMPLANT
SUT VIC AB 3-0 SH 27 (SUTURE) ×1
SUT VIC AB 3-0 SH 27X BRD (SUTURE) IMPLANT

## 2022-02-02 NOTE — H&P (Signed)
[facility] Admission History & Physical  MRN : [id number]  Emily Harding is a 81 y.o. (12-30-1940) female who presents with chief complaint of No chief complaint on file. Marland Kitchen  History of Present Illness: Patient presents today for removal of her Port-A-Cath.  She has completed her therapy pancreas cancer and is not currently using the port.  Current Facility-Administered Medications  Medication Dose Route Frequency Provider Last Rate Last Admin   0.9 %  sodium chloride infusion   Intravenous Continuous Kris Hartmann, NP 75 mL/hr at 02/02/22 0750 New Bag at 02/02/22 0750   ceFAZolin (ANCEF) 2-4 GM/100ML-% IVPB            ceFAZolin (ANCEF) IVPB 2g/100 mL premix  2 g Intravenous 30 min Pre-Op Kris Hartmann, NP       diphenhydrAMINE (BENADRYL) injection 50 mg  50 mg Intravenous Once PRN Kris Hartmann, NP       famotidine (PEPCID) tablet 40 mg  40 mg Oral Once PRN Kris Hartmann, NP       fentaNYL (SUBLIMAZE) 100 MCG/2ML injection            methylPREDNISolone sodium succinate (SOLU-MEDROL) 125 mg/2 mL injection 125 mg  125 mg Intravenous Once PRN Kris Hartmann, NP       midazolam (VERSED) 2 MG/ML syrup 8 mg  8 mg Oral Once PRN Kris Hartmann, NP       midazolam (VERSED) 5 MG/5ML injection            ondansetron (ZOFRAN) injection 4 mg  4 mg Intravenous Q6H PRN Kris Hartmann, NP       Facility-Administered Medications Ordered in Other Encounters  Medication Dose Route Frequency Provider Last Rate Last Admin   0.9 %  sodium chloride infusion   Intravenous Once Jacquelin Hawking, NP       0.9 %  sodium chloride infusion   Intravenous Once Sindy Guadeloupe, MD       0.9 %  sodium chloride infusion   Intravenous Once Sindy Guadeloupe, MD       alteplase (CATHFLO ACTIVASE) injection 2 mg  2 mg Intracatheter Once PRN Jacquelin Hawking, NP       heparin lock flush 100 unit/mL  500 Units Intracatheter Once PRN Sindy Guadeloupe, MD       heparin lock flush 100  unit/mL  250 Units Intracatheter Once PRN Jacquelin Hawking, NP       heparin lock flush 100 unit/mL  500 Units Intravenous Once Sindy Guadeloupe, MD       sodium chloride flush (NS) 0.9 % injection 10 mL  10 mL Intravenous PRN Sindy Guadeloupe, MD   10 mL at 06/06/20 1039   sodium chloride flush (NS) 0.9 % injection 10 mL  10 mL Intracatheter PRN Sindy Guadeloupe, MD       sodium chloride flush (NS) 0.9 % injection 10 mL  10 mL Intracatheter PRN Jacquelin Hawking, NP   10 mL at 06/20/20 1030   sodium chloride flush (NS) 0.9 % injection 10 mL  10 mL Intravenous PRN Jacquelin Hawking, NP   10 mL at 08/12/20 1335   sodium chloride flush (NS) 0.9 % injection 10 mL  10 mL Intravenous PRN Borders, Kirt Boys, NP   10 mL at 09/02/20 1411   sodium chloride flush (NS) 0.9 % injection 3 mL  3 mL Intracatheter PRN Jacquelin Hawking,  NP        Past Medical History:  Diagnosis Date   Anxiety    Arthritis    Breast cancer ([place]) 1993   RT MASTECTOMY   Heartburn    HTN (hypertension)    Pancreatic cancer ([place])    Personal history of chemotherapy    S/P chemotherapy, time since greater than 12 weeks 1993   BREAST CA    Past Surgical History:  Procedure Laterality Date   ABDOMINAL HYSTERECTOMY     colon polyp removal     GALLBLADDER SURGERY     LAPAROSCOPIC OOPHERECTOMY  1978   MASTECTOMY Right 1993   BREAST CA   PANCREAS SURGERY N/A 01/04/2020   Per patient removed head of pancreas due to cancer   PORTA CATH INSERTION N/A 04/04/2020   Procedure: PORTA CATH INSERTION;  Surgeon: Algernon Huxley, MD;  Location: [place] [facility];  Service: Cardiovascular;  Laterality: N/A;   removal of fallopian tubes  1989     Social History   Tobacco Use   Smoking status: Never   Smokeless tobacco: Never  Vaping Use   Vaping Use: Never used  Substance Use Topics   Alcohol use: No   Drug use: No     Family History  Problem Relation Age of Onset   Breast cancer Neg Hx    Kidney cancer Neg Hx    Bladder  Cancer Neg Hx    Prostate cancer Neg Hx     Allergies  Allergen Reactions   Hydromorphone Hcl Itching   Azithromycin    Codeine Sulfate Other (See Comments)   Doxycycline Calcium Other (See Comments)   Duloxetine Nausea Only   Erythromycin Ethylsuccinate Other (See Comments)   Macrolides And Ketolides Other (See Comments)   Nitrofurantoin     Other reaction(s): Unknown Other reaction(s): Abdominal Pain Other reaction(s): Unknown   Sulfa Antibiotics      REVIEW OF SYSTEMS (Negative unless checked)  Constitutional: '[]'$ Weight loss  '[]'$ Fever  '[]'$ Chills Cardiac: '[]'$ Chest pain   '[]'$ Chest pressure   '[]'$ Palpitations   '[]'$ Shortness of breath when laying flat   '[]'$ Shortness of breath at rest   '[]'$ Shortness of breath with exertion. Vascular:  '[]'$ Pain in legs with walking   '[]'$ Pain in legs at rest   '[]'$ Pain in legs when laying flat   '[]'$ Claudication   '[]'$ Pain in feet when walking  '[]'$ Pain in feet at rest  '[]'$ Pain in feet when laying flat   '[]'$ History of DVT   '[]'$ Phlebitis   '[]'$ Swelling in legs   '[]'$ Varicose veins   '[]'$ Non-healing ulcers Pulmonary:   '[]'$ Uses home oxygen   '[]'$ Productive cough   '[]'$ Hemoptysis   '[]'$ Wheeze  '[]'$ COPD   '[]'$ Asthma Neurologic:  '[]'$ Dizziness  '[]'$ Blackouts   '[]'$ Seizures   '[]'$ History of stroke   '[]'$ History of TIA  '[]'$ Aphasia   '[]'$ Temporary blindness   '[]'$ Dysphagia   '[]'$ Weakness or numbness in arms   '[]'$ Weakness or numbness in legs Musculoskeletal:  '[x]'$ Arthritis   '[]'$ Joint swelling   '[]'$ Joint pain   '[]'$ Low back pain Hematologic:  '[]'$ Easy bruising  '[]'$ Easy bleeding   '[]'$ Hypercoagulable state   '[]'$ Anemic  '[]'$ Hepatitis Gastrointestinal:  '[]'$ Blood in stool   '[]'$ Vomiting blood  '[x]'$ Gastroesophageal reflux/heartburn   '[]'$ Difficulty swallowing. Genitourinary:  '[]'$ Chronic kidney disease   '[]'$ Difficult urination  '[]'$ Frequent urination  '[]'$ Burning with urination   '[]'$ Blood in urine Skin:  '[]'$ Rashes   '[]'$ Ulcers   '[]'$ Wounds Psychological:  '[x]'$ History of anxiety   '[]'$  History of major depression.  Physical Examination  Vitals:  02/02/22  0733  BP: (!) 164/57  Pulse: 70  Resp: 20  Temp: 98.2 F (36.8 C)  TempSrc: Oral  SpO2: 96%  Weight: 53.5 kg  Height: '5\' 2"'$  (1.575 m)   Body mass index is 21.58 kg/m. Gen: WD/WN, NAD Head: Mutual/AT, No temporalis wasting.  Ear/Nose/Throat: Hearing grossly intact, nares w/o erythema or drainage, oropharynx w/o Erythema/Exudate,  Eyes: Conjunctiva clear, sclera non-icteric Neck: Trachea midline.  No JVD.  Pulmonary:  Good air movement, respirations not labored, no use of accessory muscles.  Cardiac: RRR, normal S1, S2. Vascular:  Vessel Right Left  Radial Palpable Palpable                   Musculoskeletal: M/S 5/5 throughout.  Extremities without ischemic changes.  No deformity or atrophy.  Neurologic: Sensation grossly intact in extremities.  Symmetrical.  Speech is fluent. Motor exam as listed above. Psychiatric: Judgment intact, Mood & affect appropriate for pt's clinical situation. Dermatologic: No rashes or ulcers noted.  No cellulitis or open wounds.      CBC Lab Results  Component Value Date   WBC 5.9 09/17/2021   HGB 12.1 09/17/2021   HCT 34.4 (L) 09/17/2021   MCV 96.4 09/17/2021   PLT 302 09/17/2021    BMET    Component Value Date/Time   NA 126 (L) 09/17/2021 1329   K 4.3 09/17/2021 1329   CL 94 (L) 09/17/2021 1329   CO2 27 09/17/2021 1329   GLUCOSE 103 (H) 09/17/2021 1329   BUN 20 09/17/2021 1329   CREATININE 0.91 09/17/2021 1329   CALCIUM 8.1 (L) 09/17/2021 1329   GFRNONAA >60 09/17/2021 1329   GFRAA 56 (L) 03/01/2019 2027   CrCl cannot be calculated (Patient's most recent lab result is older than the maximum 21 days allowed.).  COAG No results found for: "INR", "PROTIME"  Radiology No results found.   Assessment/Plan 1.  Pancreas cancer.  Completed treatment.  Oncology is good with removing the port which she desires.  Will be removed today. 2. HTN. Stable on outpatient medications and blood pressure control important in reducing the  progression of atherosclerotic disease. On appropriate oral medications.     Leotis Pain, MD  02/02/2022 8:14 AM

## 2022-02-02 NOTE — Op Note (Signed)
[facility]       Operative Note  Date: 02/02/2022  Preoperative diagnosis:  1. Pancreas cancer no longer using port  Postoperative diagnosis:  Same as above  Procedures: #1. Removal of right jugular port a cath   Surgeon: Leotis Pain, MD  Anesthesia: Local with moderate conscious sedation for 7 minutes using 2 mg of Versed and 50 mcg of Fentanyl  Fluoroscopy time: none  Contrast used: 0  Estimated blood loss: Minimal  Indication for the procedure:  The patient is a 81 y.o. female who has finished her treatment for pancreas cancer and no longer needs their Port-A-Cath. The patient desires to have this removed. Risks and benefits including need for potential replacement with recurrent disease were discussed and patient is agreeable to proceed.  Description of procedure: The patient was brought to the vascular and interventional radiology suite. Moderate conscious sedation was administered during a face to face encounter with the patient throughout the procedure with my supervision of the RN administering medicines and monitoring the patient's vital signs, pulse oximetry, telemetry and mental status throughout from the start of the procedure until the patient was taken to the recovery room.  The right neck chest and shoulder were sterilely prepped and draped, and a sterile surgical field was created. The area was then anesthetized with 1% lidocaine copiously. The previous incision was reopened and electrocautery used to dissected down to the port and the catheter. These were dissected free and the catheter was gently removed from the vein in its entirety. The port was dissected out from the fibrous connective tissue and the Prolene sutures were removed. The port was then removed in its entirety including the catheter. The wound was then closed with a 3-0 Vicryl and a 4-0 Monocryl and Dermabond was placed as a dressing. The patient  was then taken to the recovery room in stable condition having tolerated the procedure well.  Complications: none  Condition: stable   Leotis Pain, MD 02/02/2022 8:52 AM   This note was created with Dragon Medical transcription system. Any errors in dictation are purely unintentional.

## 2022-02-03 ENCOUNTER — Encounter: Payer: Self-pay | Admitting: Vascular Surgery

## 2022-02-17 ENCOUNTER — Inpatient Hospital Stay: Payer: Medicare Other | Admitting: Oncology

## 2022-02-17 ENCOUNTER — Inpatient Hospital Stay: Payer: Medicare Other

## 2022-03-24 ENCOUNTER — Encounter: Payer: Self-pay | Admitting: Nurse Practitioner

## 2022-03-31 ENCOUNTER — Ambulatory Visit: Payer: Medicare Other | Admitting: Oncology

## 2022-03-31 ENCOUNTER — Other Ambulatory Visit: Payer: Medicare Other

## 2022-04-15 ENCOUNTER — Telehealth: Payer: Self-pay | Admitting: Oncology

## 2022-04-15 NOTE — Telephone Encounter (Signed)
Pt called and stated she is getting labs done at [facility] on 3/5 and wanted to know if she still needed to get labs with [place] on 3/26.

## 2022-04-15 NOTE — Telephone Encounter (Signed)
Ok to skip labs

## 2022-05-05 ENCOUNTER — Other Ambulatory Visit: Payer: Self-pay

## 2022-05-05 ENCOUNTER — Ambulatory Visit: Payer: Self-pay | Admitting: Oncology

## 2022-05-17 ENCOUNTER — Encounter: Payer: Self-pay | Admitting: Nurse Practitioner

## 2022-06-09 ENCOUNTER — Encounter: Payer: Self-pay | Admitting: Nurse Practitioner

## 2022-06-16 ENCOUNTER — Other Ambulatory Visit: Payer: Self-pay

## 2022-06-16 ENCOUNTER — Inpatient Hospital Stay: Payer: Medicare Other | Admitting: Oncology

## 2022-07-07 ENCOUNTER — Ambulatory Visit: Payer: Medicare Other | Admitting: Oncology

## 2022-09-07 ENCOUNTER — Other Ambulatory Visit: Payer: Self-pay

## 2022-09-07 ENCOUNTER — Ambulatory Visit
Admission: RE | Admit: 2022-09-07 | Discharge: 2022-09-07 | Disposition: A | Discharge status: Home / Self Care | Payer: Medicare Other | Source: Ambulatory Visit | Attending: Physician Assistant | Admitting: Physician Assistant

## 2022-09-07 DIAGNOSIS — G44201 Tension-type headache, unspecified, intractable: Secondary | ICD-10-CM | POA: Insufficient documentation

## 2022-09-07 DIAGNOSIS — R42 Dizziness and giddiness: Secondary | ICD-10-CM

## 2022-12-04 ENCOUNTER — Emergency Department
Admission: EM | Admit: 2022-12-04 | Discharge: 2022-12-04 | Disposition: A | Discharge status: Home / Self Care | Payer: Medicare Other | Attending: Emergency Medicine | Admitting: Emergency Medicine

## 2022-12-04 ENCOUNTER — Emergency Department: Payer: Medicare Other

## 2022-12-04 ENCOUNTER — Other Ambulatory Visit: Payer: Self-pay

## 2022-12-04 DIAGNOSIS — I10 Essential (primary) hypertension: Secondary | ICD-10-CM

## 2022-12-04 DIAGNOSIS — R079 Chest pain, unspecified: Secondary | ICD-10-CM | POA: Diagnosis present

## 2022-12-04 LAB — CBC
HCT: 40.8 % (ref 36.0–46.0)
Hemoglobin: 13.4 g/dL (ref 12.0–15.0)
MCH: 31.1 pg (ref 26.0–34.0)
MCHC: 32.8 g/dL (ref 30.0–36.0)
MCV: 94.7 fL (ref 80.0–100.0)
Platelets: 271 10*3/uL (ref 150–400)
RBC: 4.31 MIL/uL (ref 3.87–5.11)
RDW: 12.2 % (ref 11.5–15.5)
WBC: 7.5 10*3/uL (ref 4.0–10.5)
nRBC: 0 % (ref 0.0–0.2)

## 2022-12-04 LAB — URINALYSIS, ROUTINE W REFLEX MICROSCOPIC
Bilirubin Urine: NEGATIVE
Glucose, UA: NEGATIVE mg/dL
Hgb urine dipstick: NEGATIVE
Ketones, ur: NEGATIVE mg/dL
Nitrite: NEGATIVE
Protein, ur: NEGATIVE mg/dL
Specific Gravity, Urine: 1.01 (ref 1.005–1.030)
pH: 5 (ref 5.0–8.0)

## 2022-12-04 LAB — BASIC METABOLIC PANEL
Anion gap: 8 (ref 5–15)
BUN: 18 mg/dL (ref 8–23)
CO2: 25 mmol/L (ref 22–32)
Calcium: 9 mg/dL (ref 8.9–10.3)
Chloride: 102 mmol/L (ref 98–111)
Creatinine, Ser: 0.93 mg/dL (ref 0.44–1.00)
GFR, Estimated: >60 mL/min (ref >60)
Glucose, Bld: 139 mg/dL — ABNORMAL HIGH (ref 70–99)
Potassium: 3.9 mmol/L (ref 3.5–5.1)
Sodium: 135 mmol/L (ref 135–145)

## 2022-12-04 LAB — TROPONIN I (HIGH SENSITIVITY): Troponin I (High Sensitivity): 12 ng/L (ref <18)

## 2022-12-04 NOTE — ED Triage Notes (Signed)
Pt states that she went to the dr for a routine check up and started having chest pain while she was in the office. Pt was told that her bp was high in the office, and pt states that she took her meds this am, pt states that her chest is feeling some better now

## 2022-12-04 NOTE — ED Provider Notes (Signed)
[facility] Provider Note    Event Date/Time   First MD Initiated Contact with Patient 12/04/22 1246     (approximate)  History   Chief Complaint: Chest Pain  HPI  Emily Harding is a 82 y.o. female with a past medical anxiety, hypertension, presents to the emergency department for chest pain.  According to the patient she was at her primary care doctor's office this morning where she mentioned she was getting intermittent chest pains.  Family member states the patient has been complaining of this for a long time is followed up with her doctor as well as her cardiologist for this in the past and they think it could be related to her fibromyalgia.  She states she was at her PCPs office for routine checkup and mention the chest pain they noted that she was quite hypertensive and they sent her to the emergency department for evaluation.  Blood pressure is 203/57.  Patient states she took all 3 of her morning blood pressure medications and states that it is common for her blood pressure to go up this high during hospital and PCP visits.  Denies any chest pain currently.  No shortness of breath.  No leg pain or swelling.  Physical Exam   Triage Vital Signs: ED Triage Vitals  Encounter Vitals Group     BP 12/04/22 1228 (!) 203/57     Systolic BP Percentile --      Diastolic BP Percentile --      Pulse Rate 12/04/22 1228 64     Resp 12/04/22 1228 16     Temp 12/04/22 1228 98.9 F (37.2 C)     Temp Source 12/04/22 1228 Oral     SpO2 12/04/22 1228 95 %     Weight 12/04/22 1229 130 lb (59 kg)     Height 12/04/22 1229 5\' 2"  (1.575 m)     Head Circumference --      Peak Flow --      Pain Score 12/04/22 1229 5     Pain Loc --      Pain Education --      Exclude from Growth Chart --     Most recent vital signs: Vitals:   12/04/22 1228  BP: (!) 203/57  Pulse: 64  Resp: 16  Temp: 98.9 F (37.2 C)  SpO2: 95%    General: Awake, no distress.  CV:  Good  peripheral perfusion.  Regular rate and rhythm  Resp:  Normal effort.  Equal breath sounds bilaterally.  Abd:  No distention.  Soft, nontender.  No rebound or guarding. Other:  No lower extremity IMA or calf tenderness to palpation.   ED Results / Procedures / Treatments   EKG  I have reviewed and interpreted the EKG.  Normal sinus rhythm 60 bpm slightly widened QRS, normal axis largely normal intervals nonspecific ST changes.  Most consistent left bundle branch block.  RADIOLOGY  I have reviewed and interpreted chest x-ray images.  No consolidation on my evaluation.   MEDICATIONS ORDERED IN ED: Medications - No data to display   IMPRESSION / MDM / ASSESSMENT AND PLAN / ED COURSE  I reviewed the triage vital signs and the nursing notes.  Patient's presentation is most consistent with acute presentation with potential threat to life or bodily function.  Patient presents emergency department for intermittent chest pain.  Overall the patient appears well, no distress.  Patient is hypertensive however she states this is fairly common for  her in hospitals or doctors offices.  Patient states that chest pain has been coming and going for a long time but she did mention it at her PCP today.  Daughter states the patient has been worked up for this in the past including at her heart doctor with no findings.  We will check labs, chest x-ray and continue to closely monitor.  EKG reassuring, chest x-ray reassuring.  Lab work is pending.  Lab work is reassuring, reassuring urinalysis, reassuring chemistry, reassuring CBC, troponin is negative.  Patient remains chest pain-free.  She is asking to be discharged home.  Given the patient's reassuring workup improving blood pressure reassuring exam I believe the patient safe for discharge home with PCP follow-up.  Patient agreeable to plan of care.  FINAL CLINICAL IMPRESSION(S) / ED DIAGNOSES   Chest pain Hypertension  Note:  This document was  prepared using Dragon voice recognition software and may include unintentional dictation errors.   Minna Antis, MD 12/04/22 [phone]

## 2022-12-04 NOTE — ED Notes (Signed)
Assisted pt to the BR and she then stated that she was having some urinary symptoms--urine sent

## 2023-03-25 ENCOUNTER — Encounter: Payer: Self-pay | Admitting: Nurse Practitioner

## 2023-03-25 NOTE — Telephone Encounter (Signed)
 Created in error

## 2023-06-11 ENCOUNTER — Other Ambulatory Visit: Payer: Self-pay | Admitting: Physician Assistant

## 2023-06-11 DIAGNOSIS — Y92009 Unspecified place in unspecified non-institutional (private) residence as the place of occurrence of the external cause: Secondary | ICD-10-CM
